# Patient Record
Sex: Male | Born: 1990 | Race: Black or African American | Hispanic: No | Marital: Single | Smoking: Light tobacco smoker
Health system: Southern US, Community
[De-identification: ages and names within clinical notes are randomized; demographics above are authoritative.]

## PROBLEM LIST (undated history)

## (undated) DIAGNOSIS — B029 Zoster without complications: Secondary | ICD-10-CM

## (undated) DIAGNOSIS — B20 Human immunodeficiency virus [HIV] disease: Secondary | ICD-10-CM

## (undated) DIAGNOSIS — L309 Dermatitis, unspecified: Secondary | ICD-10-CM

## (undated) DIAGNOSIS — Z21 Asymptomatic human immunodeficiency virus [HIV] infection status: Secondary | ICD-10-CM

## (undated) HISTORY — DX: Zoster without complications: B02.9

## (undated) HISTORY — DX: Dermatitis, unspecified: L30.9

---
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
## 2009-12-03 ENCOUNTER — Emergency Department (HOSPITAL_COMMUNITY): Admission: EM | Admit: 2009-12-03 | Discharge: 2009-12-04 | Payer: Self-pay | Admitting: Emergency Medicine

## 2011-08-05 ENCOUNTER — Emergency Department (INDEPENDENT_AMBULATORY_CARE_PROVIDER_SITE_OTHER)
Admission: EM | Admit: 2011-08-05 | Discharge: 2011-08-05 | Disposition: A | Discharge status: Home / Self Care | Payer: Medicaid Other | Source: Home / Self Care

## 2011-08-05 ENCOUNTER — Encounter: Payer: Self-pay | Admitting: *Deleted

## 2011-08-05 DIAGNOSIS — J069 Acute upper respiratory infection, unspecified: Secondary | ICD-10-CM

## 2011-08-05 MED ORDER — FEXOFENADINE-PSEUDOEPHED ER 60-120 MG PO TB12: 1.0000 | ORAL_TABLET | Freq: Two times a day (BID) | ORAL | Status: AC

## 2011-08-05 MED ORDER — IBUPROFEN 600 MG PO TABS
600.0000 mg | ORAL_TABLET | Freq: Three times a day (TID) | ORAL | Status: AC | PRN
Start: 2011-08-05 — End: 2011-08-15

## 2011-08-05 NOTE — ED Provider Notes (Signed)
History     CSN: [id number] Arrival date & time: 08/05/2011  8:34 PM   First MD Initiated Contact with Patient 08/05/11 2134      Chief Complaint  Patient presents with  . Headache  . Cough  . Chills  . Nasal Congestion    (Consider location/radiation/quality/duration/timing/severity/associated sxs/prior treatment) Patient is a 20 y.o. male presenting with URI. The history is provided by the patient.  URI The primary symptoms include headaches, cough and myalgias. Primary symptoms do not include fever, fatigue, ear pain, sore throat, swollen glands, wheezing, abdominal pain, nausea, vomiting or rash. The current episode started today. This is a new problem. The problem has not changed (has not taken any medications yest.) since onset. Symptoms associated with the illness include sinus pressure, congestion and rhinorrhea.    History reviewed. No pertinent past medical history.  History reviewed. No pertinent past surgical history.  History reviewed. No pertinent family history.  History  Substance Use Topics  . Smoking status: Never Smoker   . Smokeless tobacco: Not on file  . Alcohol Use: No      Review of Systems  Constitutional: Negative.  Negative for fever and fatigue.  HENT: Positive for congestion, rhinorrhea, sneezing and sinus pressure. Negative for ear pain and sore throat.   Respiratory: Positive for cough. Negative for wheezing.        Cough is non productive  Cardiovascular: Negative.   Gastrointestinal: Negative for nausea, vomiting and abdominal pain.  Musculoskeletal: Positive for myalgias.  Skin: Negative for rash.  Neurological: Positive for headaches.    Allergies  Review of patient's allergies indicates no known allergies.  Home Medications   Current Outpatient Rx  Name Route Sig Dispense Refill  . FEXOFENADINE-PSEUDOEPHEDRINE 60-120 MG PO TB12 Oral Take 1 tablet by mouth every 12 (twelve) hours. 30 tablet 0  . IBUPROFEN 600 MG PO TABS  Oral Take 1 tablet (600 mg total) by mouth 3 (three) times daily as needed for pain. 30 tablet 0    BP 111/67  Pulse 88  Temp(Src) 98.9 F (37.2 C) (Oral)  Resp 20  SpO2 100%  Physical Exam  Nursing note and vitals reviewed. Constitutional: He is oriented to person, place, and time. He appears well-developed and well-nourished. No distress.  HENT:  Right Ear: External ear normal.  Left Ear: External ear normal.  Mouth/Throat: No oropharyngeal exudate.       Mild pharyngeal erythema, nasal congestion, erythema and swelling of nasal turbinates with clear rhinorrhea.  Eyes: Conjunctivae are normal. Pupils are equal, round, and reactive to light.  Cardiovascular: Normal rate, regular rhythm and normal heart sounds.   Pulmonary/Chest: Effort normal and breath sounds normal. No respiratory distress. He has no wheezes. He has no rales.  Lymphadenopathy:    He has no cervical adenopathy.  Neurological: He is alert and oriented to person, place, and time.  Skin: No rash noted.    ED Course  Procedures (including critical care time)  Labs Reviewed - No data to display No results found.   1. URI (upper respiratory infection)       MDM  Symptomatic treatment for viral URI        Sharin Grave, MD 08/07/11 1049

## 2011-08-05 NOTE — ED Notes (Signed)
Pt is here with complaints of HA, facial pressure, runny nose, chills and non-productive cough with onset this morning.

## 2014-11-20 ENCOUNTER — Encounter (HOSPITAL_COMMUNITY): Payer: Self-pay | Admitting: Emergency Medicine

## 2014-11-20 ENCOUNTER — Emergency Department (INDEPENDENT_AMBULATORY_CARE_PROVIDER_SITE_OTHER)
Admission: EM | Admit: 2014-11-20 | Discharge: 2014-11-20 | Disposition: A | Discharge status: Home / Self Care | Payer: Medicaid Other | Source: Home / Self Care | Attending: Family Medicine | Admitting: Family Medicine

## 2014-11-20 DIAGNOSIS — K047 Periapical abscess without sinus: Secondary | ICD-10-CM

## 2014-11-20 MED ORDER — CLINDAMYCIN HCL 300 MG PO CAPS
300.0000 mg | ORAL_CAPSULE | Freq: Three times a day (TID) | ORAL | Status: DC
Start: 2014-11-20 — End: 2015-02-24

## 2014-11-20 NOTE — Discharge Instructions (Signed)
Thank you for coming in today. Take the antibiotic as directed.    Dental Abscess A dental abscess is a collection of infected fluid (pus) from a bacterial infection in the inner part of the tooth (pulp). It usually occurs at the end of the tooth's root.  CAUSES   Severe tooth decay.  Trauma to the tooth that allows bacteria to enter into the pulp, such as a broken or chipped tooth. SYMPTOMS   Severe pain in and around the infected tooth.  Swelling and redness around the abscessed tooth or in the mouth or face.  Tenderness.  Pus drainage.  Bad breath.  Bitter taste in the mouth.  Difficulty swallowing.  Difficulty opening the mouth.  Nausea.  Vomiting.  Chills.  Swollen neck glands. DIAGNOSIS   A medical and dental history will be taken.  An examination will be performed by tapping on the abscessed tooth.  X-rays may be taken of the tooth to identify the abscess. TREATMENT The goal of treatment is to eliminate the infection. You may be prescribed antibiotic medicine to stop the infection from spreading. A root canal may be performed to save the tooth. If the tooth cannot be saved, it may be pulled (extracted) and the abscess may be drained.  HOME CARE INSTRUCTIONS  Only take over-the-counter or prescription medicines for pain, fever, or discomfort as directed by your caregiver.  Rinse your mouth (gargle) often with salt water ( tsp salt in 8 oz [250 ml] of warm water) to relieve pain or swelling.  Do not drive after taking pain medicine (narcotics).  Do not apply heat to the outside of your face.  Return to your dentist for further treatment as directed. SEEK MEDICAL CARE IF:  Your pain is not helped by medicine.  Your pain is getting worse instead of better. SEEK IMMEDIATE MEDICAL CARE IF:  You have a fever or persistent symptoms for more than 2-3 days.  You have a fever and your symptoms suddenly get worse.  You have chills or a very bad  headache.  You have problems breathing or swallowing.  You have trouble opening your mouth.  You have swelling in the neck or around the eye. Document Released: 09/07/2005 Document Revised: 06/01/2012 Document Reviewed: 12/16/2010 [facility] Patient Information 2015 [place] [place]. This information is not intended to replace advice given to you by your health care provider. Make sure you discuss any questions you have with your health care provider.  [facility]:  [facility] [phone] (ext [phone])  [id number]  Please call Dr. Lawrence Marseillesivils office [phone] or cell [phone] [address], [place]  Cost for tooth removal $200 includes exam, Xray, and extraction and follow up visit.  Bring list of current medications with you.   [facility] - [address][phone]  [facility] [phone]- [phone]  [address]  [facility]  [address], [place], [place], [address]  [phone], Ext. [phone]  2nd and 4th Thursday of the month at 6:30am (Simple extractions only - no wisdom teeth or surgery) First come/First serve -First 10 clients served  [facility] [place] residents only)  [address], [place], [place], [address]  [phone]  [facility]  [phone]  [facility]  [phone]  [facility]  [phone]  Please call [facility] at [phone] to get the details to  get your tooth pulled.

## 2014-11-20 NOTE — ED Notes (Signed)
Right jaw swelling for 2 days.  Denies dental pain

## 2014-11-20 NOTE — ED Provider Notes (Signed)
Aaron Lee is a 24 y.o. male who presents to Urgent Care today for jaw swelling. Patient has a 2 day history of right sided lower jaw swelling. It does not hurt very much. He is taking Tylenol which helps a lot for pain. He denies any fevers or chills vomiting or diarrhea. He notes that he has a eroded tooth on that side. He attempted to contact a pediatric dentist but was not able to be seen as he is over 25 years old. No chest pain palpitations or shortness of breath.   History reviewed. No pertinent past medical history. History reviewed. No pertinent past surgical history. History  Substance Use Topics  . Smoking status: Current Every Day Smoker  . Smokeless tobacco: Not on file  . Alcohol Use: No   ROS as above Medications: No current facility-administered medications for this encounter.   Current Outpatient Prescriptions  Medication Sig Dispense Refill  . clindamycin (CLEOCIN) 300 MG capsule Take 1 capsule (300 mg total) by mouth 3 (three) times daily. 30 capsule 0   No Known Allergies   Exam:  BP 113/76 mmHg  Pulse 80  Temp(Src) 97.7 F (36.5 C) (Oral)  Resp 20  SpO2 100% Gen: Well NAD HEENT: EOMI,  MMM right premolar rotated to gumline. Tender swollen right lower jaw. No midline swelling. No palate elevation. No fluctuance. Lungs: Normal work of breathing. CTABL Heart: RRR no MRG Abd: NABS, Soft. Nondistended, Nontender Exts: Brisk capillary refill, warm and well perfused.   No results found for this or any previous visit (from the past 24 hour(s)). No results found.  Assessment and Plan: 24 y.o. male with dental infection. Treat with clindamycin. Follow-up with a dentist.  Discussed warning signs or symptoms. Please see discharge instructions. Patient expresses understanding.     Rodolph BongEvan S Callan Yontz, MD 11/20/14 1037

## 2015-01-30 ENCOUNTER — Emergency Department (HOSPITAL_COMMUNITY)
Admission: EM | Admit: 2015-01-30 | Discharge: 2015-01-30 | Disposition: A | Discharge status: Home / Self Care | Payer: Medicaid Other | Attending: Emergency Medicine | Admitting: Emergency Medicine

## 2015-01-30 ENCOUNTER — Encounter (HOSPITAL_COMMUNITY): Payer: Self-pay | Admitting: Emergency Medicine

## 2015-01-30 DIAGNOSIS — Y288XXA Contact with other sharp object, undetermined intent, initial encounter: Secondary | ICD-10-CM | POA: Diagnosis not present

## 2015-01-30 DIAGNOSIS — Y929 Unspecified place or not applicable: Secondary | ICD-10-CM | POA: Insufficient documentation

## 2015-01-30 DIAGNOSIS — S61011A Laceration without foreign body of right thumb without damage to nail, initial encounter: Secondary | ICD-10-CM | POA: Insufficient documentation

## 2015-01-30 DIAGNOSIS — Y998 Other external cause status: Secondary | ICD-10-CM | POA: Insufficient documentation

## 2015-01-30 DIAGNOSIS — Y93G1 Activity, food preparation and clean up: Secondary | ICD-10-CM | POA: Diagnosis not present

## 2015-01-30 DIAGNOSIS — Z23 Encounter for immunization: Secondary | ICD-10-CM | POA: Insufficient documentation

## 2015-01-30 DIAGNOSIS — Z720 Tobacco use: Secondary | ICD-10-CM | POA: Diagnosis not present

## 2015-01-30 MED ORDER — TETANUS-DIPHTH-ACELL PERTUSSIS 5-2.5-18.5 LF-MCG/0.5 IM SUSP
0.5000 mL | Freq: Once | INTRAMUSCULAR | Status: AC
  Administered 2015-01-30: 0.5 mL via INTRAMUSCULAR
  Filled 2015-01-30: qty 0.5

## 2015-01-30 MED ORDER — LIDOCAINE HCL (PF) 1 % IJ SOLN
10.0000 mL | Freq: Once | INTRAMUSCULAR | Status: AC
  Administered 2015-01-30: 10 mL via INTRADERMAL
  Filled 2015-01-30: qty 10

## 2015-01-30 NOTE — ED Provider Notes (Signed)
CSN: [id number]     Arrival date & time 01/30/15  2140 History  This chart was scribed for non-physician practitioner, Sharilyn SitesLisa Sanders, PA-C working with Gerhard Munchobert Lockwood, MD by Gwenyth Oberatherine Macek, ED scribe. This patient was seen in room TR10C/TR10C and the patient's care was started at 10:35 PM   Chief Complaint  Patient presents with  . Extremity Laceration    The patient said he was washing dishes and a cup broke and cut his thumb.  Bleeding is controlled but his pain is 10/10.     The history is provided by the patient. No language interpreter was used.   HPI Comments: Aaron Lee is a 24 y.o. male who presents to the Emergency Department complaining of a laceration, with controlled bleeding and associated 10/10 pain, to the lateral aspect of his thumb that occurred PTA. Pt reports that he was washing a cup when it broke on his hand. He does not know the date of his last Tetanus. Pt denies decreased ROM of his thumb. He also denies numbness and tingling as an associated symptom.   History reviewed. No pertinent past medical history. History reviewed. No pertinent past surgical history. History reviewed. No pertinent family history. History  Substance Use Topics  . Smoking status: Current Every Day Smoker  . Smokeless tobacco: Not on file  . Alcohol Use: No    Review of Systems  Skin: Positive for wound.  Neurological: Negative for numbness.  All other systems reviewed and are negative.  Allergies  Review of patient's allergies indicates no known allergies.  Home Medications   Prior to Admission medications   Medication Sig Start Date End Date Taking? Authorizing Provider  clindamycin (CLEOCIN) 300 MG capsule Take 1 capsule (300 mg total) by mouth 3 (three) times daily. Patient not taking: Reported on 01/30/2015 11/20/14   Rodolph BongEvan S Corey, MD   BP 136/93 mmHg  Pulse 64  Temp(Src) 98.4 F (36.9 C) (Oral)  Resp 16  SpO2 99%   Physical Exam  Constitutional: He is oriented to  person, place, and time. He appears well-developed and well-nourished.  HENT:  Head: Normocephalic and atraumatic.  Mouth/Throat: Oropharynx is clear and moist.  Eyes: Conjunctivae and EOM are normal. Pupils are equal, round, and reactive to light.  Neck: Normal range of motion.  Cardiovascular: Normal rate, regular rhythm and normal heart sounds.   Pulmonary/Chest: Effort normal and breath sounds normal. No respiratory distress. He has no wheezes.  Musculoskeletal: Normal range of motion.  3cm laceration to base of right thumb, bleeding well controlled; wound clean without retained foreign body; no evidence of deep tissue, tendon, or vessel involvement; full range of motion of thumb; strong radial pulse and cap refill, sensation intact  Neurological: He is alert and oriented to person, place, and time.  Skin: Skin is warm and dry.  Psychiatric: He has a normal mood and affect.  Nursing note and vitals reviewed.   ED Course  Procedures   LACERATION REPAIR Performed by: Garlon HatchetSANDERS, LISA M Authorized by: Garlon HatchetSANDERS, LISA M Consent: Verbal consent obtained. Risks and benefits: risks, benefits and alternatives were discussed Consent given by: patient Patient identity confirmed: provided demographic data Prepped and Draped in normal sterile fashion Wound explored  Laceration Location: base of right thumn  Laceration Length: 3 cm  No Foreign Bodies seen or palpated  Anesthesia: local infiltration  Local anesthetic: lidocaine 1% without epinephrine  Anesthetic total: 4 ml  Irrigation method: syringe Amount of cleaning: standard  Skin closure: 4-0  prolene  Number of sutures: 3  Technique: simple interrupted  Patient tolerance: Patient tolerated the procedure well with no immediate complications.   DIAGNOSTIC STUDIES: Oxygen Saturation is 99% on RA, normal by my interpretation.    COORDINATION OF CARE: 10:41 PM Discussed treatment plan with pt at bedside and pt agreed to  plan.  Labs Review Labs Reviewed - No data to display  Imaging Review No results found.   EKG Interpretation None      MDM   Final diagnoses:  Thumb laceration, right, initial encounter   24 year old male with 3 cm laceration to base of right thumb from broken glass. Wound is clean without evidence of retained foreign body. There is no evidence of deep tissue, vessel, or tendon involvement. Full range of motion of thumb maintained and hand is neurovascularly intact. Laceration repaired as above, patient tolerated well. Tetanus was updated. Patient instructed on home wound care. He will follow-up in one week for suture removal.  Discussed plan with patient, he/she acknowledged understanding and agreed with plan of care.  Return precautions given for new or worsening symptoms.  I personally performed the services described in this documentation, which was scribed in my presence. The recorded information has been reviewed and is accurate.  Garlon HatchetLisa M Sanders, PA-C 01/30/15 [id number]  Gerhard Munchobert Lockwood, MD 01/30/15 [phone]

## 2015-01-30 NOTE — Discharge Instructions (Signed)
Keep wound clean and dry. Follow-up with urgent care in 1 week for suture removal. Return to the ED for new or worsening symptoms.

## 2015-01-30 NOTE — ED Notes (Signed)
The patient said he was washing dishes and a cup broke and cut his thumb.  Bleeding is controlled but his pain is 10/10.

## 2015-02-08 ENCOUNTER — Emergency Department (HOSPITAL_COMMUNITY)
Admission: EM | Admit: 2015-02-08 | Discharge: 2015-02-08 | Disposition: A | Discharge status: Home / Self Care | Payer: Medicaid Other | Attending: Emergency Medicine | Admitting: Emergency Medicine

## 2015-02-08 ENCOUNTER — Encounter (HOSPITAL_COMMUNITY): Payer: Self-pay | Admitting: Emergency Medicine

## 2015-02-08 DIAGNOSIS — Z720 Tobacco use: Secondary | ICD-10-CM | POA: Insufficient documentation

## 2015-02-08 DIAGNOSIS — Z792 Long term (current) use of antibiotics: Secondary | ICD-10-CM | POA: Insufficient documentation

## 2015-02-08 DIAGNOSIS — Z4802 Encounter for removal of sutures: Secondary | ICD-10-CM | POA: Diagnosis present

## 2015-02-08 NOTE — Discharge Instructions (Signed)

## 2015-02-08 NOTE — ED Notes (Signed)
Patient here for suture removal in R hand.   No signs or symptoms of infection.   Patient denies pain.

## 2015-02-08 NOTE — ED Provider Notes (Signed)
CSN: [id number]     Arrival date & time 02/08/15  1407 History  This chart was scribed for non-physician practitioner Marlon Peliffany Hibo Blasdell, PA, working with Elwin MochaBlair Walden, MD, by Tanda RockersMargaux Venter, ED Scribe. This patient was seen in room TR08C/TR08C and the patient's care was started at 2:44 PM.     Chief Complaint  Patient presents with  . Suture / Staple Removal   The history is provided by the patient. No language interpreter was used.     HPI Comments: Aaron Lee is a 24 y.o. male who presents to the Emergency Department complaining of for suture removal to right thumb. Pt was washing dishes when he cut the base of his thumb on cup. He was seen in ED on 01/30/20 (approximately 9 days ago). He is not currently having any pain to the finger. Denies fever, chills, redness, drainage, or any other associated symptoms. Notes full ROM to thumb.    History reviewed. No pertinent past medical history. History reviewed. No pertinent past surgical history. No family history on file. History  Substance Use Topics  . Smoking status: Current Every Day Smoker  . Smokeless tobacco: Not on file  . Alcohol Use: No    Review of Systems  Constitutional: Negative for fever and chills.  Skin: Negative for color change.       No drainage.   All other systems reviewed and are negative.     Allergies  Review of patient's allergies indicates no known allergies.  Home Medications   Prior to Admission medications   Medication Sig Start Date End Date Taking? Authorizing Provider  clindamycin (CLEOCIN) 300 MG capsule Take 1 capsule (300 mg total) by mouth 3 (three) times daily. Patient not taking: Reported on 01/30/2015 11/20/14   Rodolph BongEvan S Corey, MD   Triage Vitals: BP 121/80 mmHg  Pulse 77  Temp(Src) 98.6 F (37 C) (Oral)  Resp 22  SpO2 99%   Physical Exam  Constitutional: He is oriented to person, place, and time. He appears well-developed and well-nourished. No distress.  HENT:  Head:  Normocephalic and atraumatic.  Eyes: Conjunctivae and EOM are normal.  Neck: Neck supple. No tracheal deviation present.  Cardiovascular: Normal rate.   Pulmonary/Chest: Effort normal. No respiratory distress.  Musculoskeletal: Normal range of motion.       Hands: Neurological: He is alert and oriented to person, place, and time.  Skin: Skin is warm and dry.  Psychiatric: He has a normal mood and affect. His behavior is normal.  Nursing note and vitals reviewed.   ED Course  Procedures (including critical care time)  DIAGNOSTIC STUDIES: Oxygen Saturation is 99% on RA, normal by my interpretation.    COORDINATION OF CARE: 2:46 PM-Discussed treatment plan which includes suture removal with pt at bedside and pt agreed to plan.   Labs Review Labs Reviewed - No data to display  Imaging Review No results found.   EKG Interpretation None      MDM   Final diagnoses:  Visit for suture removal   SUTURE REMOVAL Performed by: Dorthula MatasGREENE,Jakyle Petrucelli G  Consent: Verbal consent obtained. Patient identity confirmed: provided demographic data Time out: Immediately prior to procedure a "time out" was called to verify the correct patient, procedure, equipment, support staff and site/side marked as required.  Location details: right lateral palm  Wound Appearance: clean  Sutures/Staples Removed: 3  Facility: sutures placed in this facility Patient tolerance: Patient tolerated the procedure well with no immediate complications.   23 y.o.Aaron LawlessMarcus D  Lee's evaluation in the Emergency Department is complete. It has been determined that no acute conditions requiring further emergency intervention are present at this time. The patient/guardian have been advised of the diagnosis and plan. We have discussed signs and symptoms that warrant return to the ED, such as changes or worsening in symptoms.  Vital signs are stable at discharge. Filed Vitals:   02/08/15 1422  BP: 121/80  Pulse: 77   Temp: 98.6 F (37 C)  Resp: 22    Patient/guardian has voiced understanding and agreed to follow-up with the PCP or specialist.  I personally performed the services described in this documentation, which was scribed in my presence. The recorded information has been reviewed and is accurate.      Marlon Peliffany Dagan Heinz, PA-C 02/08/15 1458  Elwin MochaBlair Walden, MD 02/08/15 1515

## 2015-02-23 ENCOUNTER — Emergency Department (HOSPITAL_COMMUNITY)
Admission: EM | Admit: 2015-02-23 | Discharge: 2015-02-24 | Disposition: A | Discharge status: Home / Self Care | Payer: Medicaid Other | Attending: Emergency Medicine | Admitting: Emergency Medicine

## 2015-02-23 ENCOUNTER — Encounter (HOSPITAL_COMMUNITY): Payer: Self-pay | Admitting: Emergency Medicine

## 2015-02-23 DIAGNOSIS — Z720 Tobacco use: Secondary | ICD-10-CM | POA: Insufficient documentation

## 2015-02-23 DIAGNOSIS — K047 Periapical abscess without sinus: Secondary | ICD-10-CM | POA: Insufficient documentation

## 2015-02-23 DIAGNOSIS — K088 Other specified disorders of teeth and supporting structures: Secondary | ICD-10-CM | POA: Diagnosis present

## 2015-02-23 DIAGNOSIS — K029 Dental caries, unspecified: Secondary | ICD-10-CM | POA: Insufficient documentation

## 2015-02-23 NOTE — ED Notes (Signed)
Patient here with dental pain and swelling starting last night. Obvious swelling to lower right right jaw. No obvious damaged or decaying tooth in that area. Denies fever.

## 2015-02-24 MED ORDER — BUPIVACAINE-EPINEPHRINE (PF) 0.5% -1:200000 IJ SOLN
1.8000 mL | Freq: Once | INTRAMUSCULAR | Status: AC
  Administered 2015-02-24: 1.8 mL
  Filled 2015-02-24: qty 1.8

## 2015-02-24 MED ORDER — IBUPROFEN 800 MG PO TABS: 800.0000 mg | ORAL_TABLET | Freq: Three times a day (TID) | ORAL | Status: DC

## 2015-02-24 MED ORDER — CLINDAMYCIN HCL 150 MG PO CAPS: 450.0000 mg | ORAL_CAPSULE | Freq: Three times a day (TID) | ORAL | Status: DC

## 2015-02-24 MED ORDER — CLINDAMYCIN HCL 300 MG PO CAPS
450.0000 mg | ORAL_CAPSULE | Freq: Once | ORAL | Status: AC
  Administered 2015-02-24: 450 mg via ORAL
  Filled 2015-02-24: qty 1

## 2015-02-24 NOTE — Discharge Instructions (Signed)
1. Medications: clindamycin, ibuprofen, usual home medications 2. Treatment: rest, drink plenty of fluids, take medications as prescribed 3. Follow Up: Please followup with dentistry within 1 week for discussion of your diagnoses and further evaluation after today's visit; if you do not have a primary care doctor use the resource guide provided to find one; Return to the ER for high fevers, difficulty breathing, difficulty swallowing or other concerning symptoms    Dental Abscess A dental abscess is a collection of infected fluid (pus) from a bacterial infection in the inner part of the tooth (pulp). It usually occurs at the end of the tooth's root.  CAUSES   Severe tooth decay.  Trauma to the tooth that allows bacteria to enter into the pulp, such as a broken or chipped tooth. SYMPTOMS   Severe pain in and around the infected tooth.  Swelling and redness around the abscessed tooth or in the mouth or face.  Tenderness.  Pus drainage.  Bad breath.  Bitter taste in the mouth.  Difficulty swallowing.  Difficulty opening the mouth.  Nausea.  Vomiting.  Chills.  Swollen neck glands. DIAGNOSIS   A medical and dental history will be taken.  An examination will be performed by tapping on the abscessed tooth.  X-rays may be taken of the tooth to identify the abscess. TREATMENT The goal of treatment is to eliminate the infection. You may be prescribed antibiotic medicine to stop the infection from spreading. A root canal may be performed to save the tooth. If the tooth cannot be saved, it may be pulled (extracted) and the abscess may be drained.  HOME CARE INSTRUCTIONS  Only take over-the-counter or prescription medicines for pain, fever, or discomfort as directed by your caregiver.  Rinse your mouth (gargle) often with salt water ( tsp salt in 8 oz [250 ml] of warm water) to relieve pain or swelling.  Do not drive after taking pain medicine (narcotics).  Do not apply  heat to the outside of your face.  Return to your dentist for further treatment as directed. SEEK MEDICAL CARE IF:  Your pain is not helped by medicine.  Your pain is getting worse instead of better. SEEK IMMEDIATE MEDICAL CARE IF:  You have a fever or persistent symptoms for more than 2-3 days.  You have a fever and your symptoms suddenly get worse.  You have chills or a very bad headache.  You have problems breathing or swallowing.  You have trouble opening your mouth.  You have swelling in the neck or around the eye. Document Released: 09/07/2005 Document Revised: 06/01/2012 Document Reviewed: 12/16/2010 [facility] Patient Information 2015 [place], [place]. This information is not intended to replace advice given to you by your health care provider. Make sure you discuss any questions you have with your health care provider.   Emergency Department Resource Guide 1) Find a Doctor and Pay Out of Pocket Although you won't have to find out who is covered by your insurance plan, it is a good idea to ask around and get recommendations. You will then need to call the office and see if the doctor you have chosen will accept you as a new patient and what types of options they offer for patients who are self-pay. Some doctors offer discounts or will set up payment plans for their patients who do not have insurance, but you will need to ask so you aren't surprised when you get to your appointment.  2) Contact Your Local Health Department Not all health  departments have doctors that can see patients for sick visits, but many do, so it is worth a call to see if yours does. If you don't know where your local health department is, you can check in your phone book. The CDC also has a tool to help you locate your state's health department, and many state websites also have listings of all of their local health departments.  3) Find a Walk-in Clinic If your illness is not likely to be very severe or  complicated, you may want to try a walk in clinic. These are popping up all over the country in pharmacies, drugstores, and shopping centers. They're usually staffed by nurse practitioners or physician assistants that have been trained to treat common illnesses and complaints. They're usually fairly quick and inexpensive. However, if you have serious medical issues or chronic medical problems, these are probably not your best option.  No Primary Care Doctor: - Call Health Connect at  [phone] - they can help you locate a primary care doctor that  accepts your insurance, provides certain services, etc. - Physician Referral Service- [phone]  Chronic Pain Problems: Organization         Address  Phone   Notes  [facility]  [phone] Patients need to be referred by their primary care doctor.   Medication Assistance: Organization         Address  Phone   Notes  [facility] [address] [place], [place] [address] [phone] --Must be a resident of [facility] [place] -- Must have NO insurance coverage whatsoever (no Medicaid/ Medicare, etc.) -- The pt. MUST have a primary care doctor that directs their care regularly and follows them in the community   MedAssist  [phone]   [employer]  [phone]    Agencies that provide inexpensive medical care: Organization         Address  Phone   Notes  [facility]  [phone]) [phone]   [facility]    [phone]   [facility] [address] [place], [place] [address] [phone]   [facility] [place] [address], [place] [phone]) [phone]   Planned Parenthood    [phone](336) [phone]   [facility]    [phone]) [phone]   [facility]  [address], [place] Phone:  [phone]) [phone], Fax:  [phone] Hours of Operation:  9 am - 6 pm, M-F.  Also accepts  Medicaid/Medicare and self-pay.  [facility]  [address], [place] Phone: [phone], Fax: [phone]) [phone]. Hours of Operation:  8:30 am - 5:30 pm, M-F.  Also accepts Medicaid and self-pay.  [facility] [place] [address], [place] Phone: [phone]   [facility] [address], [place], [place] [phone], Ext. [phone] Mondays & Thursdays: 7-9 AM.  First 15 patients are seen on a first come, first serve basis.    Medicaid-accepting [facility] Providers:  Organization         Address  Phone   Notes  [facility] [address], [place] [phone]) [phone] Also accepts self-pay patients.  [facility] [address], [place]  [phone]) [phone]   [facility] [address], [place] [phone]) [phone]   [facility] [address], [place] [phone]   Renaye Rakers [address], [place]   [phone] Only accepts [place] patients after they have their name applied to their card.   Self-Pay (no insurance) in [facility]:  Organization         Address  Phone   Notes  Sickle Cell Patients, [facility] [address] [place], [place] [phone]   [facility] [address] [place], [place] [phone]   [facility] [place]  [address], [place]o Ramirez [phone]   [facility]/Dr. Osei-Bonsu  [address] [place] or [address], [place] [phone] Phone number for both [place] and [place] locations is the same.  Urgent Medical and [facility] [address], [place] [phone]   [facility] [address] [place] or [address] [phone]   [facility] [address] [place] [phone], phone; [phone], fax Sees patients 1st and 3rd Saturday of every month.  Must not qualify for public or private insurance (i.e. Medicaid, Medicare, [place] Health Choice, Veterans' Benefits)  Household income should be no more than 200% of the poverty level The clinic cannot treat you if you are pregnant or think you are pregnant  Sexually transmitted diseases are not treated at the clinic.    Dental Care: Organization         Address  Phone  Notes  [facility] Department of [facility] [address] [place], [place] [phone] Accepts children up to age 70 who are enrolled in [place] or [place] Health Choice; pregnant women with a Medicaid card; and children who have applied for Medicaid or [place] Health Choice, but were declined, whose parents can pay a reduced fee at time of service.  [facility] of [facility]  [address], [place] [phone] Accepts children up to age 57 who are enrolled in [place] or [place] Health Choice; pregnant women with a Medicaid card; and children who have applied for Medicaid or [place] Health Choice, but were declined, whose parents can pay a reduced fee at time of service.  [facility]  [address] [place], [place] [phone] Patients are seen by appointment only. Walk-ins are not accepted. [facility] will see patients 50 years of age and older. Monday - Tuesday (8am-5pm) Most Wednesdays (8:30-5pm) $30 per visit, cash only  [facility] Adult Dental Access PROGRAM  [address], [facility] [phone] Patients are seen by appointment only. Walk-ins are not accepted. [facility] will see patients 49 years of age and older. One Wednesday Evening (Monthly: Volunteer Based).  $30 per visit, cash only  [employer]  [phone] for adults; Children under age 45, call Graduate Pediatric Dentistry at [phone]. Children aged  88-14, please call [phone] to request a pediatric application.  Dental services are provided in all areas of dental care including fillings, crowns and bridges, complete and partial dentures, implants, gum treatment, root canals, and extractions. Preventive care is also provided. Treatment is provided to both adults and children. Patients are selected via a lottery and there is often a waiting list.   [facility] [address], [place]  [phone] [email or web address]   [facility] [address], [place], [place] [phone], Ext. [phone] Second and Fourth Thursday of each month, opens at 6:30 AM; Clinic ends at 9 AM.  Patients are seen on a first-come first-served basis, and a limited number are seen during each clinic.   [facility]  [address], [place], [place] [phone]) [phone]   Eligibility Requirements You must have lived in [place], [place], or [place] counties for at least the last three months.   You cannot be eligible for state or federal sponsored National Cityhealthcare insurance, including CIGNAVeterans Administration, [place]icaid, or [employer]icare.   You generally cannot be eligible for healthcare insurance through your employer.    How to apply: Eligibility screenings are held every Tuesday and Wednesday afternoon from 1:00 pm until 4:00 pm. You do not need an appointment for the interview!  [facility] [address], [place], [place] [phone]   [facility]  [phone]   [facility]  [phone]   [facility]  [phone]    Behavioral Health Resources in the Community: Intensive Outpatient Programs Organization         Address  Phone  Notes  [facility] [address], [place], [place] [phone]   [facility] [address], [place], [place] [phone]   [facility] [address],  [place], [place]  [phone]   [facility] [address],  [place], [place] [phone] or [phone]   Substance Abuse Resources Organization         Address  Phone  Notes  [facility] Services  [phone]   [facility]  [phone]   [facility]  [phone]   [facility]  [phone]   Residential & Outpatient Substance Abuse Program  [phone]   Psychological Services Organization         Address  Phone  Notes  [facility]  [phone]- [phone]   [facility]  [phone]- [phone]   [facility] [address], [place] [phone] or [phone]    Mobile Crisis Teams Organization         Address  Phone  Notes  [facility], Mobile Crisis Care Unit  [phone]   [facility]  [address] [place], [place] [phone]   Doristine LocksSharon DeEsch [address] [place] [phone]    Self-Help/Support Groups Organization         Address  Phone             Notes  [facility] [place] - variety of support groups  336- [phone] Call for more information  Narcotics Anonymous (NA), [facility] [address], [place]  2 meetings at this location   Statisticianesidential Treatment Programs Organization         Address  Phone  Notes  [facility] [address],    [place]  [phone]   [facility]  [address], [place] [phone], [place], [place] [phone]   [facility] [address], [place] [phone] Admissions: 8am-3pm M-F  [facility] [address]    [place], [place] [phone]   [facility] [address], [place], [place] [phone]   [facility] [address]  [place], [place] [phone]   [facility] - Intensive Outpatient [phone],  [place], [place] [phone]   [facility]  (Addiction Recovery Care [facility] [address]  [place], [place] [phone] or [phone]   [facility] ([facility]) [address] [place], [place] [phone] Accepts Medicaid  [facility] [address],  [place] [phone] Substance Abuse/Addiction Treatment   [facility] Organization         Address  Phone  Notes  [facility]  [phone]   Angie Fava, PhD [address]vin Knack [place], [place]   [phone] or [phone]   [facility]   [address] [place], [place] [phone]   [facility] [address], [place], [place] [phone] Insurance/Medicaid/sponsorship through [facility] and Families [address]                                    [place], [place] [phone] Therapy/tele-psych/case  [address] [facility] [address] Drive[place], [place] [phone]    Dr. Lolly Mustache  [phone]   [facility] [place]  [employer] [facility] Dept. 1) [address] [place] 2) [address] [place] 3)  [address], [place] [phone]  [phone]   [facility] Child Abuse Hotline 228-123-8938 or 5102879685 (After Hours)

## 2015-02-24 NOTE — ED Provider Notes (Signed)
CSN: [id number]     Arrival date & time 02/23/15  2327 History   First MD Initiated Contact with Patient 02/23/15 2355     Chief Complaint  Patient presents with  . Dental Pain     (Consider location/radiation/quality/duration/timing/severity/associated sxs/prior Treatment) Patient is a 24 y.o. male presenting with tooth pain. The history is provided by the patient and medical records. No language interpreter was used.  Dental Pain Associated symptoms: facial swelling   Associated symptoms: no drooling, no fever, no headaches and no neck pain      BOLESLAUS HOLLOWAY is a 24 y.o. male  with no major medical problems presents to the Emergency Department complaining of gradual, persistent, progressively worsening lower right dental pain with associated facial swelling onset yesterday.  No treatments PTA.  Nothing makes it better and eating makes it worse.  Pt denies fever, chills, nausea, vomiting.     History reviewed. No pertinent past medical history. History reviewed. No pertinent past surgical history. History reviewed. No pertinent family history. History  Substance Use Topics  . Smoking status: Current Some Day Smoker  . Smokeless tobacco: Not on file  . Alcohol Use: Yes     Comment: occ    Review of Systems  Constitutional: Negative for fever, chills and appetite change.  HENT: Positive for dental problem and facial swelling. Negative for drooling, ear pain, nosebleeds, postnasal drip, rhinorrhea and trouble swallowing.   Eyes: Negative for pain and redness.  Respiratory: Negative for cough and wheezing.   Cardiovascular: Negative for chest pain.  Gastrointestinal: Negative for nausea, vomiting and abdominal pain.  Musculoskeletal: Negative for neck pain and neck stiffness.  Skin: Negative for color change and rash.  Neurological: Negative for weakness, light-headedness and headaches.  All other systems reviewed and are negative.     Allergies  Review of patient's  allergies indicates no known allergies.  Home Medications   Prior to Admission medications   Medication Sig Start Date End Date Taking? Authorizing Provider  clindamycin (CLEOCIN) 150 MG capsule Take 3 capsules (450 mg total) by mouth 3 (three) times daily. 02/24/15   Zierra Laroque, PA-C  ibuprofen (ADVIL,MOTRIN) 800 MG tablet Take 1 tablet (800 mg total) by mouth 3 (three) times daily. 02/24/15   Aaleeyah Bias, PA-C   BP 109/59 mmHg  Pulse 54  Temp(Src) 98.3 F (36.8 C) (Oral)  Resp 16  SpO2 97% Physical Exam  Constitutional: He appears well-developed and well-nourished.  HENT:  Head: Normocephalic.  Right Ear: Tympanic membrane, external ear and ear canal normal.  Left Ear: Tympanic membrane, external ear and ear canal normal.  Nose: Nose normal. Right sinus exhibits no maxillary sinus tenderness and no frontal sinus tenderness. Left sinus exhibits no maxillary sinus tenderness and no frontal sinus tenderness.  Mouth/Throat: Uvula is midline, oropharynx is clear and moist and mucous membranes are normal. No oral lesions. Abnormal dentition. Dental caries present. No uvula swelling or lacerations. No oropharyngeal exudate, posterior oropharyngeal edema, posterior oropharyngeal erythema or tonsillar abscesses.  Gingival swelling and fluctuance along the lower right gumline of tooth #29-30 No tenderness to palpation, induration or swelling of the floor of the mouth Soft tissue of the mandible without induration, erythema or swelling  Eyes: Conjunctivae are normal. Pupils are equal, round, and reactive to light. Right eye exhibits no discharge. Left eye exhibits no discharge.  Neck: Normal range of motion. Neck supple.  No stridor Handling secretions without difficulty No nuchal rigidity No cervical lymphadenopathy   Cardiovascular: Normal  rate, regular rhythm and normal heart sounds.   Pulmonary/Chest: Effort normal. No respiratory distress.  Equal chest rise  Abdominal:  Soft. Bowel sounds are normal. He exhibits no distension. There is no tenderness.  Lymphadenopathy:    He has no cervical adenopathy.  Neurological: He is alert.  Skin: Skin is warm and dry.  Psychiatric: He has a normal mood and affect.  Nursing note and vitals reviewed.   ED Course  INCISION AND DRAINAGE Date/Time: 02/24/2015 12:47 AM Performed by: Dierdre ForthMUTHERSBAUGH, [place] Viereck Authorized by: Dierdre ForthMUTHERSBAUGH, Evalisse Prajapati Consent: Verbal consent obtained. Risks and benefits: risks, benefits and alternatives were discussed Consent given by: patient Patient understanding: patient states understanding of the procedure being performed Patient consent: the patient's understanding of the procedure matches consent given Procedure consent: procedure consent matches procedure scheduled Relevant documents: relevant documents present and verified Site marked: the operative site was marked Required items: required blood products, implants, devices, and special equipment available Patient identity confirmed: verbally with patient and arm band Time out: Immediately prior to procedure a "time out" was called to verify the correct patient, procedure, equipment, support staff and site/side marked as required. Type: abscess Body area: mouth (gingiva) Anesthesia: local infiltration Local anesthetic: bupivacaine 0.5% with epinephrine Anesthetic total: 1.8 ml Patient sedated: no Scalpel size: 11 Incision type: single straight Complexity: simple Drainage: purulent Drainage amount: copious Wound treatment: wound left open Patient tolerance: Patient tolerated the procedure well with no immediate complications  Dental Date/Time: 02/24/2015 12:43 AM Performed by: Dierdre ForthMUTHERSBAUGH, Lis Savitt Authorized by: Dierdre ForthMUTHERSBAUGH, Odarius Dines Consent: Verbal consent obtained. Risks and benefits: risks, benefits and alternatives were discussed Consent given by: patient Patient understanding: patient states understanding of the procedure  being performed Patient consent: the patient's understanding of the procedure matches consent given Procedure consent: procedure consent matches procedure scheduled Relevant documents: relevant documents present and verified Site marked: the operative site was marked Required items: required blood products, implants, devices, and special equipment available Patient identity confirmed: verbally with patient and arm band Time out: Immediately prior to procedure a "time out" was called to verify the correct patient, procedure, equipment, support staff and site/side marked as required. Preparation: Patient was prepped and draped in the usual sterile fashion. Local anesthesia used: yes Anesthesia: local infiltration Local anesthetic: bupivacaine 0.5% with epinephrine Anesthetic total: 1.8 ml Patient sedated: no Patient tolerance: Patient tolerated the procedure well with no immediate complications   (including critical care time) Labs Review Labs Reviewed - No data to display  Imaging Review No results found.   EKG Interpretation None      MDM   Final diagnoses:  Dental abscess  Pain due to dental caries   Ann HeldMarcus D Feng presents with right lower dental pain and gross abscess.  I&D of abscess without complication.  Exam unconcerning for Ludwig's angina or spread of infection.  Will treat with clindamycin and pain medicine.  First dose of antibiotic given in the ED.  Urged patient to follow-up with dentist.  Pt without signs or symptoms of systemic infection; does not meet SIRS or sepsis criteria.   BP 109/59 mmHg  Pulse 54  Temp(Src) 98.3 F (36.8 C) (Oral)  Resp 16  SpO2 97%   Dierdre ForthHannah Maddyn Lieurance, PA-C 02/24/15 0048  Richardean Canalavid H Yao, MD 02/24/15 [phone]

## 2015-02-24 NOTE — ED Notes (Signed)
450 mg of Clindamycin given (See MAR)

## 2015-09-30 ENCOUNTER — Emergency Department (HOSPITAL_COMMUNITY)
Admission: EM | Admit: 2015-09-30 | Discharge: 2015-09-30 | Disposition: A | Discharge status: Home / Self Care | Payer: Medicaid Other | Attending: Emergency Medicine | Admitting: Emergency Medicine

## 2015-09-30 ENCOUNTER — Encounter (HOSPITAL_COMMUNITY): Payer: Self-pay | Admitting: Family Medicine

## 2015-09-30 DIAGNOSIS — F17200 Nicotine dependence, unspecified, uncomplicated: Secondary | ICD-10-CM | POA: Diagnosis not present

## 2015-09-30 DIAGNOSIS — R21 Rash and other nonspecific skin eruption: Secondary | ICD-10-CM | POA: Diagnosis not present

## 2015-09-30 DIAGNOSIS — Z791 Long term (current) use of non-steroidal anti-inflammatories (NSAID): Secondary | ICD-10-CM | POA: Insufficient documentation

## 2015-09-30 DIAGNOSIS — Z792 Long term (current) use of antibiotics: Secondary | ICD-10-CM | POA: Insufficient documentation

## 2015-09-30 MED ORDER — DIPHENHYDRAMINE HCL 25 MG PO TABS: 25.0000 mg | ORAL_TABLET | Freq: Four times a day (QID) | ORAL | Status: DC

## 2015-09-30 MED ORDER — PREDNISONE 20 MG PO TABS: 40.0000 mg | ORAL_TABLET | Freq: Every day | ORAL | Status: DC

## 2015-09-30 NOTE — ED Provider Notes (Signed)
CSN: [id number]     Arrival date & time 09/30/15  1155 History   First MD Initiated Contact with Patient 09/30/15 1224     Chief Complaint  Patient presents with  . Rash     (Consider location/radiation/quality/duration/timing/severity/associated sxs/prior Treatment) HPI Aaron Lee is a 25 y.o. male who comes in for valuation of rash. Patient reports on Christmas he received a ninja turtles body wash and since he started using it he has broken out in a diffuse, itchy rash. He has not tried anything to improve his symptoms. Nothing seems to make it better or worse. He denies any shortness of breath, difficulty breathing/swallowing, nausea or vomiting, abdominal pain. Denies any allergies or other past medical history. No other modifying factors.  History reviewed. No pertinent past medical history. History reviewed. No pertinent past surgical history. History reviewed. No pertinent family history. Social History  Substance Use Topics  . Smoking status: Current Some Day Smoker  . Smokeless tobacco: None  . Alcohol Use: Yes     Comment: occ    Review of Systems A 10 point review of systems was completed and was negative except for pertinent positives and negatives as mentioned in the history of present illness    Allergies  Review of patient's allergies indicates no known allergies.  Home Medications   Prior to Admission medications   Medication Sig Start Date End Date Taking? Authorizing Provider  clindamycin (CLEOCIN) 150 MG capsule Take 3 capsules (450 mg total) by mouth 3 (three) times daily. 02/24/15   Hannah Muthersbaugh, PA-C  diphenhydrAMINE (BENADRYL) 25 MG tablet Take 1 tablet (25 mg total) by mouth every 6 (six) hours. 09/30/15   Joycie Peek, PA-C  ibuprofen (ADVIL,MOTRIN) 800 MG tablet Take 1 tablet (800 mg total) by mouth 3 (three) times daily. 02/24/15   Hannah Muthersbaugh, PA-C  predniSONE (DELTASONE) 20 MG tablet Take 2 tablets (40 mg total) by mouth daily.  09/30/15   Liya Strollo, PA-C   BP 140/80 mmHg  Pulse 16  Temp(Src) 97.5 F (36.4 C) (Oral)  Resp 16  Ht 5\' 3"  (1.6 m)  Wt 49.641 kg  BMI 19.39 kg/m2  SpO2 98% Physical Exam  Constitutional:  Awake, alert, nontoxic appearance.  HENT:  Head: Atraumatic.  Eyes: Right eye exhibits no discharge. Left eye exhibits no discharge.  Neck: Neck supple.  Cardiovascular: Normal rate, regular rhythm and normal heart sounds.   Pulmonary/Chest: Effort normal and breath sounds normal. He exhibits no tenderness.  Abdominal: Soft. There is no tenderness. There is no rebound.  Musculoskeletal: He exhibits no tenderness.  Baseline ROM, no obvious new focal weakness.  Neurological:  Mental status and motor strength appears baseline for patient and situation.  Skin: No rash noted.  Diffuse, urticarial rash to extremities and trunk/back. No drainage, vesicles or overt erythema. No sloughing or scaling. There is mild excoriations.  Psychiatric: He has a normal mood and affect.  Nursing note and vitals reviewed.   ED Course  Procedures (including critical care time) Labs Review Labs Reviewed - No data to display  Imaging Review No results found. I have personally reviewed and evaluated these images and lab results as part of my medical decision-making.   EKG Interpretation None     Meds given in ED:  Medications - No data to display  Discharge Medication List as of 09/30/2015 12:49 PM    START taking these medications   Details  diphenhydrAMINE (BENADRYL) 25 MG tablet Take 1 tablet (25 mg total)  by mouth every 6 (six) hours., Starting 09/30/2015, Until Discontinued, Print    predniSONE (DELTASONE) 20 MG tablet Take 2 tablets (40 mg total) by mouth daily., Starting 09/30/2015, Until Discontinued, Print       Filed Vitals:   09/30/15 1208 09/30/15 1254  BP: 142/79 140/80  Pulse: 85 16  Temp: 97.5 F (36.4 C) 97.5 F (36.4 C)  TempSrc: Oral Oral  Resp: 14 16  Height: 5\' 3"  (1.6 m)    Weight: 49.641 kg   SpO2: 97% 98%    MDM  Aaron Lee is a 25 y.o. male who presents today for rash consistent with contact dermatitis. Symptoms onset after new body wash. No evidence of anaphylaxis. Will DC with Benadryl for itching, short course oral steroids. Encouraged cessation of his body wash. No evidence of other acute or emergent pathology at this time. Overall, patient appears well, nontoxic, hemodynamically stable and appropriate for discharge. Last heart rate recording erroneous. The patient appears reasonably screened and/or stabilized for discharge and I doubt any other medical condition or other [facility] requiring further screening, evaluation, or treatment in the ED at this time prior to discharge.   Final diagnoses:  Rash       Joycie PeekBenjamin Navpreet Szczygiel, PA-C 09/30/15 1322  Eber HongBrian Miller, MD 10/01/15 1148

## 2015-09-30 NOTE — Discharge Instructions (Signed)
Take your medications as we discussed and as prescribed. Follow-up with your doctor as needed. Please avoid using this particular body wash. Return to ED for any new or worsening symptoms.  Allergies An allergy is an abnormal reaction to a substance by the body's defense system (immune system). Allergies can develop at any age. WHAT CAUSES ALLERGIES? An allergic reaction happens when the immune system mistakenly reacts to a normally harmless substance, called an allergen, as if it were harmful. The immune system releases antibodies to fight the substance. Antibodies eventually release a chemical called histamine into the bloodstream. The release of histamine is meant to protect the body from infection, but it also causes discomfort. An allergic reaction can be triggered by:  Eating an allergen.  Inhaling an allergen.  Touching an allergen. WHAT TYPES OF ALLERGIES ARE THERE? There are many types of allergies. Common types include:  Seasonal allergies. People with this type of allergy are usually allergic to substances that are only present during certain seasons, such as molds and pollens.  Food allergies.  Drug allergies.  Insect allergies.  Animal dander allergies. WHAT ARE SYMPTOMS OF ALLERGIES? Possible allergy symptoms include:  Swelling of the lips, face, tongue, mouth, or throat.  Sneezing, coughing, or wheezing.  Nasal congestion.  Tingling in the mouth.  Rash.  Itching.  Itchy, red, swollen areas of skin (hives).  Watery eyes.  Vomiting.  Diarrhea.  Dizziness.  Lightheadedness.  Fainting.  Trouble breathing or swallowing.  Chest tightness.  Rapid heartbeat. HOW ARE ALLERGIES DIAGNOSED? Allergies are diagnosed with a medical and family history and one or more of the following:  Skin tests.  Blood tests.  A food diary. A food diary is a record of all the foods and drinks you have in a day and of all the symptoms you experience.  The results of  an elimination diet. An elimination diet involves eliminating foods from your diet and then adding them back in one by one to find out if a certain food causes an allergic reaction. HOW ARE ALLERGIES TREATED? There is no cure for allergies, but allergic reactions can be treated with medicine. Severe reactions usually need to be treated at a hospital. HOW CAN REACTIONS BE PREVENTED? The best way to prevent an allergic reaction is by avoiding the substance you are allergic to. Allergy shots and medicines can also help prevent reactions in some cases. People with severe allergic reactions may be able to prevent a life-threatening reaction called anaphylaxis with a medicine given right after exposure to the allergen.   This information is not intended to replace advice given to you by your health care provider. Make sure you discuss any questions you have with your health care provider.   Document Released: 12/01/2002 Document Revised: 09/28/2014 Document Reviewed: 06/19/2014 Elsevier Interactive Patient Education [employer]2016 Else[employer] Inc.

## 2015-09-30 NOTE — ED Notes (Signed)
Pt here for rash all over that started after using new body wash.

## 2015-12-24 ENCOUNTER — Emergency Department (HOSPITAL_COMMUNITY)
Admission: EM | Admit: 2015-12-24 | Discharge: 2015-12-24 | Disposition: A | Discharge status: Home / Self Care | Payer: Medicaid Other | Attending: Emergency Medicine | Admitting: Emergency Medicine

## 2015-12-24 ENCOUNTER — Encounter (HOSPITAL_COMMUNITY): Payer: Self-pay | Admitting: Emergency Medicine

## 2015-12-24 DIAGNOSIS — F17200 Nicotine dependence, unspecified, uncomplicated: Secondary | ICD-10-CM | POA: Diagnosis not present

## 2015-12-24 DIAGNOSIS — K644 Residual hemorrhoidal skin tags: Secondary | ICD-10-CM

## 2015-12-24 DIAGNOSIS — K6289 Other specified diseases of anus and rectum: Secondary | ICD-10-CM | POA: Diagnosis present

## 2015-12-24 DIAGNOSIS — Z79899 Other long term (current) drug therapy: Secondary | ICD-10-CM | POA: Insufficient documentation

## 2015-12-24 MED ORDER — IBUPROFEN 400 MG PO TABS
800.0000 mg | ORAL_TABLET | Freq: Once | ORAL | Status: AC
  Administered 2015-12-24: 800 mg via ORAL
  Filled 2015-12-24: qty 2

## 2015-12-24 MED ORDER — IBUPROFEN 800 MG PO TABS
800.0000 mg | ORAL_TABLET | Freq: Three times a day (TID) | ORAL | Status: DC
Start: 2015-12-24 — End: 2017-07-08

## 2015-12-24 MED ORDER — LIDOCAINE-HYDROCORTISONE ACE 3-0.5 % RE CREA: 1.0000 | TOPICAL_CREAM | Freq: Two times a day (BID) | RECTAL | Status: DC

## 2015-12-24 NOTE — ED Notes (Signed)
Pt sts hemorrhoids with pain and itching x 2 days; pt denies bleeding

## 2015-12-24 NOTE — ED Provider Notes (Signed)
CSN: [id number]     Arrival date & time 12/24/15  7829 History   First MD Initiated Contact with Patient 12/24/15 [phone]     Chief Complaint  Patient presents with  . Hemorrhoids     HPI  HPI Comments:  Aaron Lee is a 25 y.o. male who presents to the Emergency Department complaining of perirectal pain that he believes is hemorrhoids that started two days ago. He reports minimal itching. He states he feels "bumps" around anal area. He has not done anything to treat the symptoms. He denies modifying factors. He denies abdominal pain, abnormal bowel movements, fever, chills, nausea, vomiting or anal intercourse.   History reviewed. No pertinent past medical history. History reviewed. No pertinent past surgical history. History reviewed. No pertinent family history. Social History  Substance Use Topics  . Smoking status: Current Some Day Smoker  . Smokeless tobacco: None  . Alcohol Use: Yes     Comment: occ    Review of Systems  All other systems reviewed and are negative.     Allergies  Review of patient's allergies indicates no known allergies.  Home Medications   Prior to Admission medications   Medication Sig Start Date End Date Taking? Authorizing Provider  clindamycin (CLEOCIN) 150 MG capsule Take 3 capsules (450 mg total) by mouth 3 (three) times daily. 02/24/15   Hannah Muthersbaugh, PA-C  diphenhydrAMINE (BENADRYL) 25 MG tablet Take 1 tablet (25 mg total) by mouth every 6 (six) hours. 09/30/15   Joycie Peek, PA-C  ibuprofen (ADVIL,MOTRIN) 800 MG tablet Take 1 tablet (800 mg total) by mouth 3 (three) times daily. 02/24/15   Hannah Muthersbaugh, PA-C  ibuprofen (ADVIL,MOTRIN) 800 MG tablet Take 1 tablet (800 mg total) by mouth 3 (three) times daily. 12/24/15   Ace Gins Lis Savitt, PA-C  lidocaine-hydrocortisone (ANAMANTEL HC) 3-0.5 % CREA Place 1 Applicatorful rectally 2 (two) times daily. 12/24/15   Ace Gins Jocilyn Trego, PA-C  predniSONE (DELTASONE) 20 MG tablet Take 2 tablets (40 mg  total) by mouth daily. 09/30/15   Joycie Peek, PA-C   BP 118/82 mmHg  Pulse 83  Temp(Src) 98.1 F (36.7 C) (Oral)  Resp 18  SpO2 100% Physical Exam  Constitutional: He is oriented to person, place, and time. No distress.  HENT:  Head: Atraumatic.  Right Ear: External ear normal.  Left Ear: External ear normal.  Nose: Nose normal.  Eyes: Conjunctivae are normal. No scleral icterus.  Neck: Normal range of motion. Neck supple.  Cardiovascular: Normal rate and regular rhythm.   Pulmonary/Chest: Effort normal. No respiratory distress. He exhibits no tenderness.  Abdominal: Soft. Bowel sounds are normal. He exhibits no distension. There is no tenderness.  Genitourinary:  Chaperone present. On DRE there are multiple external hemorrhoids visualized. TTP. Soft, not thrombosed. No bleeding. No vesicles or pustules. No internal lesions palpated.  Neurological: He is alert and oriented to person, place, and time.  Skin: Skin is warm and dry. He is not diaphoretic.  Psychiatric: He has a normal mood and affect. His behavior is normal.  Nursing note and vitals reviewed.   ED Course  Procedures (including critical care time) Labs Review Labs Reviewed - No data to display  Imaging Review No results found. I have personally reviewed and evaluated these images and lab results as part of my medical decision-making.   EKG Interpretation None      MDM   Final diagnoses:  External hemorrhoids    Exam consistent with nonthrombosed external hemorrhoids. Pt has  not tried anything to alleviate his symptoms to this point. I discussed conservative non-surgical management with the pt including PO NSAIDs, sitz baths, increasing dietary fiber. He would like to try a topical medication as well which I think is reasonable. Rx given for lidocaine/hydrocortisone with instructions to not use for longer than one week. Pt otherwise afebrile and nontoxic appearing. HE is stable for discharge. ER return  precautions given.    Carlene CoriaSerena Y Jazleen Robeck, PA-C 12/24/15 [id number]  Raeford RazorStephen Kohut, MD 12/26/15 [phone]

## 2015-12-24 NOTE — ED Notes (Signed)
C/o hemorrhoidal pain. Denies bleeding.

## 2015-12-24 NOTE — Discharge Instructions (Signed)

## 2016-07-15 ENCOUNTER — Ambulatory Visit (HOSPITAL_COMMUNITY)
Admission: EM | Admit: 2016-07-15 | Discharge: 2016-07-15 | Disposition: A | Discharge status: Home / Self Care | Payer: Medicaid Other | Attending: Emergency Medicine | Admitting: Emergency Medicine

## 2016-07-15 ENCOUNTER — Encounter (HOSPITAL_COMMUNITY): Payer: Self-pay | Admitting: *Deleted

## 2016-07-15 DIAGNOSIS — K529 Noninfective gastroenteritis and colitis, unspecified: Secondary | ICD-10-CM

## 2016-07-15 MED ORDER — ONDANSETRON HCL 4 MG/2ML IJ SOLN
4.0000 mg | Freq: Once | INTRAMUSCULAR | Status: AC
  Administered 2016-07-15: 4 mg via INTRAMUSCULAR

## 2016-07-15 MED ORDER — ONDANSETRON HCL 4 MG/2ML IJ SOLN
INTRAMUSCULAR | Status: AC
  Filled 2016-07-15: qty 2

## 2016-07-15 MED ORDER — ONDANSETRON HCL 4 MG PO TABS: 4.0000 mg | ORAL_TABLET | Freq: Four times a day (QID) | ORAL | 0 refills | Status: DC

## 2016-07-15 NOTE — ED Provider Notes (Signed)
CSN: [id number]     Arrival date & time 07/15/16  1700 History   First MD Initiated Contact with Patient 07/15/16 1842     Chief Complaint  Patient presents with  . Emesis   (Consider location/radiation/quality/duration/timing/severity/associated sxs/prior Treatment) HPI NP PT IS 25 Y/O MALE WITH [employer] FOR THE LAST 3 DAYS. STATES HE IS UNABLE TO TAKE FLUIDS. FEELS WEAK NO FEVER. NO BLOOD IN STOOL. 4 DIARRHEA STOOLS IN 3 DAYS. NO WATERY. History reviewed. No pertinent past medical history. History reviewed. No pertinent surgical history. History reviewed. No pertinent family history. Social History  Substance Use Topics  . Smoking status: Current Some Day Smoker  . Smokeless tobacco: Not on file  . Alcohol use Yes     Comment: occ    Review of Systems  Denies: HEADACHE, NAUSEA, ABDOMINAL PAIN, CHEST PAIN, CONGESTION, DYSURIA, SHORTNESS OF BREATH  Allergies  Review of patient's allergies indicates no known allergies.  Home Medications   Prior to Admission medications   Medication Sig Start Date End Date Taking? Authorizing Provider  clindamycin (CLEOCIN) 150 MG capsule Take 3 capsules (450 mg total) by mouth 3 (three) times daily. 02/24/15   Hannah Muthersbaugh, PA-C  diphenhydrAMINE (BENADRYL) 25 MG tablet Take 1 tablet (25 mg total) by mouth every 6 (six) hours. 09/30/15   Joycie Peek, PA-C  ibuprofen (ADVIL,MOTRIN) 800 MG tablet Take 1 tablet (800 mg total) by mouth 3 (three) times daily. 02/24/15   Hannah Muthersbaugh, PA-C  ibuprofen (ADVIL,MOTRIN) 800 MG tablet Take 1 tablet (800 mg total) by mouth 3 (three) times daily. 12/24/15   Ace Gins Sam, PA-C  lidocaine-hydrocortisone (ANAMANTEL HC) 3-0.5 % CREA Place 1 Applicatorful rectally 2 (two) times daily. 12/24/15   Ace Gins Sam, PA-C  ondansetron (ZOFRAN) 4 MG tablet Take 1 tablet (4 mg total) by mouth every 6 (six) hours. 07/15/16   Tharon Aquas, PA  predniSONE (DELTASONE) 20 MG tablet Take 2 tablets (40 mg total) by mouth  daily. 09/30/15   Joycie Peek, PA-C   Meds Ordered and Administered this Visit   Medications  ondansetron [facility]) injection 4 mg (4 mg Intramuscular Given 07/15/16 1849)    BP 98/55 (BP Location: Right Arm)   Pulse 88   Temp 98.6 F (37 C) (Oral)   Resp 20   SpO2 99%  No data found.   Physical Exam NURSES NOTES AND VITAL SIGNS REVIEWED. CONSTITUTIONAL: Well developed, well nourished, no acute distress HEENT: normocephalic, atraumatic EYES: Conjunctiva normal NECK:normal ROM, supple, no adenopathy PULMONARY:No respiratory distress, normal effort ABDOMINAL: Soft, ND, NT BS+, No CVAT MUSCULOSKELETAL: Normal ROM of all extremities,  SKIN: warm and dry without rash PSYCHIATRIC: Mood and affect, behavior are normal  Urgent Care Course   Clinical Course  PT LOOKS WELL. WELL HYDRATED TAKING SIPS OF GATORAIDE.  RX FOR ZOFRAN  Procedures (including critical care time)  Labs Review Labs Reviewed - No data to display  Imaging Review No results found.   Visual Acuity Review  Right Eye Distance:   Left Eye Distance:   Bilateral Distance:    Right Eye Near:   Left Eye Near:    Bilateral Near:         MDM   1. Gastroenteritis     Patient is reassured that there are no issues that require transfer to higher level of care at this time or additional tests. Patient is advised to continue home symptomatic treatment. Patient is advised that if there are new or worsening symptoms  to attend the emergency department, contact primary care provider, or return to UC. Instructions of care provided discharged home in stable condition.    THIS NOTE WAS GENERATED USING A VOICE RECOGNITION SOFTWARE PROGRAM. ALL REASONABLE EFFORTS  WERE MADE TO PROOFREAD THIS DOCUMENT FOR ACCURACY.  I have verbally reviewed the discharge instructions with the patient. A printed AVS was given to the patient.  All questions were answered prior to discharge.      Tharon AquasFrank C Arick Mareno,  PA 07/15/16 2102

## 2016-07-15 NOTE — ED Triage Notes (Signed)
Pt  Reports       Nausea   Vomiting  Diarrhea   X   3  Days     Dry  Mucous  Membranes     Unable  To  Tolerate  Po   Fluids   Without  Vomiting    Feels  Weak  As   Well

## 2016-08-26 ENCOUNTER — Encounter (HOSPITAL_COMMUNITY): Payer: Self-pay | Admitting: *Deleted

## 2016-08-26 ENCOUNTER — Emergency Department (HOSPITAL_COMMUNITY)
Admission: EM | Admit: 2016-08-26 | Discharge: 2016-08-26 | Disposition: A | Discharge status: Home / Self Care | Payer: Medicaid Other | Attending: Emergency Medicine | Admitting: Emergency Medicine

## 2016-08-26 DIAGNOSIS — K644 Residual hemorrhoidal skin tags: Secondary | ICD-10-CM | POA: Diagnosis not present

## 2016-08-26 DIAGNOSIS — K6289 Other specified diseases of anus and rectum: Secondary | ICD-10-CM | POA: Diagnosis present

## 2016-08-26 DIAGNOSIS — F17200 Nicotine dependence, unspecified, uncomplicated: Secondary | ICD-10-CM | POA: Diagnosis not present

## 2016-08-26 NOTE — ED Provider Notes (Signed)
[facility]-EMERGENCY DEPT Provider Note   CSN: [id number] Arrival date & time: 08/26/16  1427  By signing my name below, I, Aaron Lee, attest that this documentation has been prepared under the direction and in the presence of physician practitioner, Nira ConnPedro Eduardo Aftyn Nott, MD. Electronically Signed: Linna Darnerussell Lee, Scribe. 08/26/2016. 4:25 PM.  History   Chief Complaint Chief Complaint  Patient presents with  . Hemorrhoids  . Rectal Pain    The history is provided by the patient. No language interpreter was used.     HPI Comments: Aaron Lee is a [age] y.o. male with PMHx significant for hemorrhoids who presents to the Emergency Department complaining of sudden onset, constant, rectal pain beginning 3 days ago. He notes a h/o hemorrhoids and states his current pain feels the same. He endorses pain exacerbation with pressure to his rectum. No alleviating factors noted. He is not sexually active currently but when he is it is only with females; no anal intercourse or penetration. He denies constipation, hematochezia, abdominal pain, fever, chills, nausea, vomiting, diarrhea, CP, SOB, or any other associated symptoms.  History reviewed. No pertinent past medical history.  There are no active problems to display for this patient.   History reviewed. No pertinent surgical history.     Home Medications    Prior to Admission medications   Medication Sig Start Date End Date Taking? Authorizing Provider  clindamycin (CLEOCIN) 150 MG capsule Take 3 capsules (450 mg total) by mouth 3 (three) times daily. Patient not taking: Reported on 08/26/2016 02/24/15   Dahlia ClientHannah Muthersbaugh, PA-C  diphenhydrAMINE (BENADRYL) 25 MG tablet Take 1 tablet (25 mg total) by mouth every 6 (six) hours. Patient not taking: Reported on 08/26/2016 09/30/15   Joycie PeekBenjamin Cartner, PA-C  ibuprofen (ADVIL,MOTRIN) 800 MG tablet Take 1 tablet (800 mg total) by mouth 3 (three) times daily. Patient not taking: Reported on  08/26/2016 02/24/15   Dahlia ClientHannah Muthersbaugh, PA-C  ibuprofen (ADVIL,MOTRIN) 800 MG tablet Take 1 tablet (800 mg total) by mouth 3 (three) times daily. Patient not taking: Reported on 08/26/2016 12/24/15   Ace GinsSerena Y Sam, PA-C  lidocaine-hydrocortisone [facility](ANAMANTEL HC) 3-0.5 % CREA Place 1 Applicatorful rectally 2 (two) times daily. Patient not taking: Reported on 08/26/2016 12/24/15   Ace GinsSerena Y Sam, PA-C  ondansetron (ZOFRAN) 4 MG tablet Take 1 tablet (4 mg total) by mouth every 6 (six) hours. Patient not taking: Reported on 08/26/2016 07/15/16   Tharon AquasFrank C Patrick, PA  predniSONE (DELTASONE) 20 MG tablet Take 2 tablets (40 mg total) by mouth daily. Patient not taking: Reported on 08/26/2016 09/30/15   Joycie PeekBenjamin Cartner, PA-C    Family History History reviewed. No pertinent family history.  Social History Social History  Substance Use Topics  . Smoking status: Current Some Day Smoker  . Smokeless tobacco: Not on file  . Alcohol use Yes     Comment: occ     Allergies   Patient has no known allergies.   Review of Systems Review of Systems  A complete 10 system review of systems was obtained and all systems are negative except as noted in the HPI and PMH.   Physical Exam Updated Vital Signs BP 125/90 (BP Location: Right Arm)   Pulse 88   Temp 98.6 F (37 C) (Oral)   Resp 19   SpO2 97%   Physical Exam  Constitutional: He is oriented to person, place, and time. He appears well-developed and well-nourished. No distress.  HENT:  Head: Normocephalic and atraumatic.  Nose: Nose normal.  Eyes: Conjunctivae and EOM are normal. Pupils are equal, round, and reactive to light. Right eye exhibits no discharge. Left eye exhibits no discharge. No scleral icterus.  Neck: Normal range of motion. Neck supple.  Cardiovascular: Normal rate and regular rhythm.  Exam reveals no gallop and no friction rub.   No murmur heard. Pulmonary/Chest: Effort normal and breath sounds normal. No stridor. No respiratory  distress. He has no rales.  Abdominal: Soft. He exhibits no distension. There is no tenderness.  Genitourinary:  Genitourinary Comments: Rectal exam: hemorrhoids at 1 o'clock, 3 o'clock, and 5 o'clock.  Musculoskeletal: He exhibits no edema or tenderness.  Neurological: He is alert and oriented to person, place, and time.  Skin: Skin is warm and dry. No rash noted. He is not diaphoretic. No erythema.  Psychiatric: He has a normal mood and affect.  Vitals reviewed.     ED Treatments / Results  Labs (all labs ordered are listed, but only abnormal results are displayed) Labs Reviewed - No data to display  EKG  EKG Interpretation None       Radiology No results found.  Procedures Procedures (including critical care time)  DIAGNOSTIC STUDIES: Oxygen Saturation is 98% on RA, normal by my interpretation.    COORDINATION OF CARE: 4:31 PM Discussed treatment plan with pt at bedside and pt agreed to plan.  Medications Ordered in ED Medications - No data to display   Initial Impression / Assessment and Plan / ED Course  I have reviewed the triage vital signs and the nursing notes.  Pertinent labs & imaging results that were available during my care of the patient were reviewed by me and considered in my medical decision making (see chart for details).  Clinical Course as of Aug 26 1642  Wed Aug 26, 2016  1630 Nonthrombosed external hemorrhoids that are tender to palpation. Patient denies any hematochezia. No anal intercourse or penetration.  Discussed symptomatic treatment for hemorrhoids and additional treatment with stool softeners.  The patient is safe for discharge with strict return precautions.   [PC]    Clinical Course User Index [PC] Nira ConnPedro Eduardo Ritu Gagliardo, MD      Final Clinical Impressions(s) / ED Diagnoses   Final diagnoses:  External hemorrhoids    Disposition: Discharge  Condition: Good  I have discussed the results, Dx and Tx plan with the  patient who expressed understanding and agree(s) with the plan. Discharge instructions discussed at great length. The patient was given strict return precautions who verbalized understanding of the instructions. No further questions at time of discharge.    Current Discharge Medication List      Follow Up: primary care provider       I personally performed the services described in this documentation, which was scribed in my presence. The recorded information has been reviewed and is accurate.        Nira ConnPedro Eduardo Zade Falkner, MD 08/26/16 [phone]

## 2016-08-26 NOTE — ED Triage Notes (Signed)
Pt reports having hemorrhoids and rectal pain, hx of same. No acute distress noted at triage.

## 2016-08-26 NOTE — Discharge Instructions (Signed)
Use over the counter Preparation H or other Hemorrhoidal cream. You can also place NeoSporin with Lidocaine. In addition, use MiraLax or another stool softner to prevent additional hemorrhoids.

## 2017-03-30 ENCOUNTER — Ambulatory Visit (INDEPENDENT_AMBULATORY_CARE_PROVIDER_SITE_OTHER): Payer: Medicaid Other | Admitting: Internal Medicine

## 2017-03-30 VITALS — BP 129/110 | HR 78 | Temp 98.1°F | Wt 105.8 lb

## 2017-03-30 DIAGNOSIS — Z79899 Other long term (current) drug therapy: Secondary | ICD-10-CM | POA: Diagnosis not present

## 2017-03-30 DIAGNOSIS — J302 Other seasonal allergic rhinitis: Secondary | ICD-10-CM | POA: Diagnosis not present

## 2017-03-30 DIAGNOSIS — F17210 Nicotine dependence, cigarettes, uncomplicated: Secondary | ICD-10-CM | POA: Diagnosis not present

## 2017-03-30 DIAGNOSIS — R21 Rash and other nonspecific skin eruption: Secondary | ICD-10-CM | POA: Diagnosis present

## 2017-03-30 MED ORDER — FLUTICASONE PROPIONATE 50 MCG/ACT NA SUSP: 2.0000 | Freq: Every day | NASAL | 2 refills | Status: DC

## 2017-03-30 MED ORDER — DIPHENHYDRAMINE HCL 25 MG PO TABS: 25.0000 mg | ORAL_TABLET | Freq: Four times a day (QID) | ORAL | 0 refills | Status: DC | PRN

## 2017-03-30 MED ORDER — HYDROCORTISONE 2.5 % EX CREA: TOPICAL_CREAM | Freq: Two times a day (BID) | CUTANEOUS | 0 refills | Status: DC

## 2017-03-30 NOTE — Patient Instructions (Addendum)
[name],  It was a pleasure meeting you today.  Please start taking benadryl and use hydrocortisone cream for your rash. Please stop using your current body wash.  Please avoid body wash that has scents or dyes in them.   Please use flonase for your nasal congestion

## 2017-03-30 NOTE — Progress Notes (Signed)
   CC: Acute Rash on shoulders bilaterally   HPI:  [name] is a 26 y.o. male with history of external hemorrhoids that presents to the internal medicine clinic for one-month history of pruritic rash located bilaterally on shoulders and tighs. He states that he had a similar rash in January of last year and was attributed to body wash. He states that the current rash started after using similar body wash. He states he has tried a cream for his symptoms with little benefit but cannot tell me the name of the cream. He denies fever/chills, tenderness to rash or abdominal pain.      Review of Systems:  Review of Systems  Constitutional: Negative for chills and fever.  Musculoskeletal: Negative for myalgias.     Physical Exam:  Vitals:   03/30/17 1030  BP: (!) 129/110  Pulse: 78  Temp: 98.1 F (36.7 C)  TempSrc: Oral  SpO2: 100%  Weight: 105 lb 12.8 oz (48 kg)   Physical Exam  Constitutional: He is well-developed, well-nourished, and in no distress.  Cardiovascular: Normal rate, regular rhythm and normal heart sounds.  Exam reveals no gallop and no friction rub.   No murmur heard. Pulmonary/Chest: Effort normal and breath sounds normal. No respiratory distress. He has no wheezes. He has no rales.  Skin:  Excoriations that are healed on shoulders bilaterally.  Minimal small raised bumps on shoulders and thigh No erythema noted    Assessment & Plan:   See encounters tab for problem based medical decision making.   Patient discussed with Dr. Criselda PeachesMullen

## 2017-03-31 DIAGNOSIS — L309 Dermatitis, unspecified: Secondary | ICD-10-CM | POA: Insufficient documentation

## 2017-03-31 DIAGNOSIS — L209 Atopic dermatitis, unspecified: Secondary | ICD-10-CM | POA: Insufficient documentation

## 2017-03-31 DIAGNOSIS — J302 Other seasonal allergic rhinitis: Secondary | ICD-10-CM | POA: Insufficient documentation

## 2017-03-31 NOTE — Assessment & Plan Note (Signed)
Assessment: Allergic Rhinitis Patient presents with runny nose that is worse when he is outside. He states that he has used nasal sprays in the past with benefit. He denies fever/chills, cough or shortness of breath.  Will give a prescription for Flonase  Plan -Flonase

## 2017-03-31 NOTE — Progress Notes (Signed)
Internal Medicine Clinic Attending  Case discussed with Dr. Hoffman at the time of the visit.  We reviewed the resident's history and exam and pertinent patient test results.  I agree with the assessment, diagnosis, and plan of care documented in the resident's note.  

## 2017-03-31 NOTE — Assessment & Plan Note (Addendum)
Assessment: Acute rash on shoulders bilaterally Patient presents with a one-month history of itching on his shoulders and thighs bilaterally. He states that the itching started after using body wash.  He has a history of rash in January 2017 for similar situation of which he was treated with benadryl and oral steroids.  On exam minimal raised bumps were noted on shoulders and thighs bilaterally.  Scratch marks were noted on the shoulders.  Rash and history is consistent with contact dermatitis.Will treat with Benadryl and hydrocortisone 2.5% cream   Plan -Benadryl -hydrocortisone 2.5% cream - told patient to stop body wash and avoid body wash with scents and dyes

## 2017-06-28 NOTE — Progress Notes (Signed)
   CC: follow up of rash  HPI:  [name] is a 26 y.o. with PMH contact dermatitis who presents for follow upof the rash. Please see the assessment and plans for the status of the patient chronic medical problems.   No past medical history on file. Review of Systems:  Refer to history of present illness and assessment and plans for pertinent review of systems, all others reviewed and negative  Physical Exam:  Vitals:   06/29/17 1438  BP: 105/64   General: well appearing, skinny, mild cognitive slowing  cardiac regular rate and rhythm, no murmurs Lungs clear to auscultation bilateral Skin: erythematous papules with overlying excoriations over his abdomen, shoulders, and the hairline of his neck, no crusting lesions  Abdomen: scaphoid  Assessment & Plan:   Contact dermatitis  Presents today with a pruritic rash which was thought to be related to his fragrant body wash at last office visit. At that time he was prescribed hydrocortisone cream and advised to take Benadryl and stop using the body wash. The rash improved somewhat with hydrocortisone cream however he switch to another soap which is still fragrant and the rash developed again. He has not tried Benadryl yet. - Advised the use of Dove body soap without fragrance and avoiding fragrant laundry detergents - refilled hydrocortisone cream - encouraged trial of Benadryl  Healthcare maintenance  - flu vaccine given today   See Encounters Tab for problem based charting.  Patient seen with Dr. Oswaldo Lee

## 2017-06-29 ENCOUNTER — Encounter (INDEPENDENT_AMBULATORY_CARE_PROVIDER_SITE_OTHER): Payer: Self-pay

## 2017-06-29 ENCOUNTER — Ambulatory Visit (INDEPENDENT_AMBULATORY_CARE_PROVIDER_SITE_OTHER): Payer: Medicaid Other | Admitting: Internal Medicine

## 2017-06-29 DIAGNOSIS — Z23 Encounter for immunization: Secondary | ICD-10-CM

## 2017-06-29 DIAGNOSIS — R21 Rash and other nonspecific skin eruption: Secondary | ICD-10-CM | POA: Diagnosis not present

## 2017-06-29 MED ORDER — HYDROCORTISONE 2.5 % EX CREA: TOPICAL_CREAM | Freq: Two times a day (BID) | CUTANEOUS | 0 refills | Status: DC

## 2017-06-29 MED ORDER — LIDOCAINE-HYDROCORTISONE ACE 3-0.5 % RE CREA: 1.0000 | TOPICAL_CREAM | Freq: Two times a day (BID) | RECTAL | 0 refills | Status: DC

## 2017-06-29 NOTE — Assessment & Plan Note (Signed)
Presents today with a pruritic rash which was thought to be related to his fragrant body wash at last office visit. At that time he was prescribed hydrocortisone cream and advised to take Benadryl and stop using the body wash. The rash improved somewhat with hydrocortisone cream however he switch to another soap which is still fragrant and the rash developed again. He has not tried Benadryl yet. - Advised the use of Dove body soap without fragrance and avoiding fragrant laundry detergents - refilled hydrocortisone cream - encouraged trial of Benadryl

## 2017-06-29 NOTE — Patient Instructions (Addendum)
It was a pleasure to see you today patient - For your itchy skin- try taking benadryl to relieve your itch, I have refilled your allergy cream but this can also be found over the counter, Stay away from soaps and laundry detergent with fragrance in them, the best body soap for your allergy is Dove.  - Please call our clinic if you have any problems or questions, we may be able to help you and keep you from a long emergency room wait. Our clinic and after hours phone number is [phone]       Mediterranean Diet  Why follow it? Research shows. . Those who follow the Mediterranean diet have a reduced risk of heart disease  . The diet is associated with a reduced incidence of Parkinson's and Alzheimer's diseases . People following the diet may have longer life expectancies and lower rates of chronic diseases  . The Dietary Guidelines for Americans recommends the Mediterranean diet as an eating plan to promote health and prevent disease  What Is the Mediterranean Diet?  . Healthy eating plan based on typical foods and recipes of Mediterranean-style cooking . The diet is primarily a plant based diet; these foods should make up a majority of meals   Starches - Plant based foods should make up a majority of meals - They are an important sources of vitamins, minerals, energy, antioxidants, and fiber - Choose whole grains, foods high in fiber and minimally processed items  - Typical grain sources include wheat, oats, barley, corn, brown rice, bulgar, farro, millet, polenta, couscous  - Various types of beans include chickpeas, lentils, fava beans, black beans, white beans   Fruits  Veggies - Large quantities of antioxidant rich fruits & veggies; 6 or more servings  - Vegetables can be eaten raw or lightly drizzled with oil and cooked  - Vegetables common to the traditional Mediterranean Diet include: artichokes, arugula, beets, broccoli, brussel sprouts, cabbage, carrots, celery, collard greens,  cucumbers, eggplant, kale, leeks, lemons, lettuce, mushrooms, okra, onions, peas, peppers, potatoes, pumpkin, radishes, rutabaga, shallots, spinach, sweet potatoes, turnips, zucchini - Fruits common to the Mediterranean Diet include: apples, apricots, avocados, cherries, clementines, dates, figs, grapefruits, grapes, melons, nectarines, oranges, peaches, pears, pomegranates, strawberries, tangerines  Fats - Replace butter and margarine with healthy oils, such as olive oil, canola oil, and tahini  - Limit nuts to no more than a handful a day  - Nuts include walnuts, almonds, pecans, pistachios, pine nuts  - Limit or avoid candied, honey roasted or heavily salted nuts - Olives are central to the [employer] - can be eaten whole or used in a variety of dishes   Meats Protein - Limiting red meat: no more than a few times a month - When eating red meat: choose lean cuts and keep the portion to the size of deck of cards - Eggs: approx. 0 to 4 times a week  - Fish and lean poultry: at least 2 a week  - Healthy protein sources include, chicken, [place], lean beef, lamb - Increase intake of seafood such as tuna, salmon, trout, mackerel, shrimp, scallops - Avoid or limit high fat processed meats such as sausage and bacon  Dairy - Include moderate amounts of low fat dairy products  - Focus on healthy dairy such as fat free yogurt, skim milk, low or reduced fat cheese - Limit dairy products higher in fat such as whole or 2% milk, cheese, ice cream  Alcohol - Moderate amounts of red wine  is ok  - No more than 5 oz daily for women (all ages) and men older than age 35  - No more than 10 oz of wine daily for men younger than 4  Other - Limit sweets and other desserts  - Use herbs and spices instead of salt to flavor foods  - Herbs and spices common to the traditional Mediterranean Diet include: basil, bay leaves, chives, cloves, cumin, fennel, garlic, lavender, marjoram, mint, oregano, parsley, pepper,  rosemary, sage, savory, sumac, tarragon, thyme   It's not just a diet, it's a lifestyle:  . The Mediterranean diet includes lifestyle factors typical of those in the region  . Foods, drinks and meals are best eaten with others and savored . Daily physical activity is important for overall good health . This could be strenuous exercise like running and aerobics . This could also be more leisurely activities such as walking, housework, yard-work, or taking the stairs . Moderation is the key; a balanced and healthy diet accommodates most foods and drinks . Consider portion sizes and frequency of consumption of certain foods   Meal Ideas & Options:  . Breakfast:  o Whole wheat toast or whole wheat English muffins with peanut butter & hard boiled egg o Steel cut oats topped with apples & cinnamon and skim milk  o Fresh fruit: banana, strawberries, melon, berries, peaches  o Smoothies: strawberries, bananas, greek yogurt, peanut butter o Low fat greek yogurt with blueberries and granola  o Egg white omelet with spinach and mushrooms o Breakfast couscous: whole wheat couscous, apricots, skim milk, cranberries  . Sandwiches:  o Hummus and grilled vegetables (peppers, zucchini, squash) on whole wheat bread   o Grilled chicken on whole wheat pita with lettuce, tomatoes, cucumbers or tzatziki  o Tuna salad on whole wheat bread: tuna salad made with greek yogurt, olives, red peppers, capers, green onions o Garlic rosemary lamb pita: lamb sauted with garlic, rosemary, salt & pepper; add lettuce, cucumber, greek yogurt to pita - flavor with lemon juice and black pepper  . Seafood:  o Mediterranean grilled salmon, seasoned with garlic, basil, parsley, lemon juice and black pepper o Shrimp, lemon, and spinach whole-grain pasta salad made with low fat greek yogurt  o Seared scallops with lemon orzo  o Seared tuna steaks seasoned salt, pepper, coriander topped with tomato mixture of olives, tomatoes,  olive oil, minced garlic, parsley, green onions and cappers  . Meats:  o Herbed greek chicken salad with kalamata olives, cucumber, feta  o Red bell peppers stuffed with spinach, bulgur, lean ground beef (or lentils) & topped with feta   o Kebabs: skewers of chicken, tomatoes, onions, zucchini, squash  o [place] burgers: made with red onions, mint, dill, lemon juice, feta cheese topped with roasted red peppers . Vegetarian o Cucumber salad: cucumbers, artichoke hearts, celery, red onion, feta cheese, tossed in olive oil & lemon juice  o Hummus and whole grain pita points with a greek salad (lettuce, tomato, feta, olives, cucumbers, red onion) o Lentil soup with celery, carrots made with vegetable broth, garlic, salt and pepper  o Tabouli salad: parsley, bulgur, mint, scallions, cucumbers, tomato, radishes, lemon juice, olive oil, salt and pepper.

## 2017-06-30 ENCOUNTER — Encounter (HOSPITAL_COMMUNITY): Payer: Self-pay | Admitting: Emergency Medicine

## 2017-06-30 DIAGNOSIS — R1310 Dysphagia, unspecified: Secondary | ICD-10-CM | POA: Diagnosis present

## 2017-06-30 DIAGNOSIS — L309 Dermatitis, unspecified: Secondary | ICD-10-CM | POA: Insufficient documentation

## 2017-06-30 DIAGNOSIS — Z79899 Other long term (current) drug therapy: Secondary | ICD-10-CM | POA: Insufficient documentation

## 2017-06-30 DIAGNOSIS — R112 Nausea with vomiting, unspecified: Secondary | ICD-10-CM | POA: Insufficient documentation

## 2017-06-30 DIAGNOSIS — K219 Gastro-esophageal reflux disease without esophagitis: Secondary | ICD-10-CM | POA: Insufficient documentation

## 2017-06-30 NOTE — ED Triage Notes (Signed)
Pt states when he eats or drinks anything it feels like it gets stuck in his esophagus  Pt states he vomits at night  Pt states this has been going on 3-4 weeks  Pt states he saw his dr yesterday and was told to take some tums

## 2017-06-30 NOTE — Progress Notes (Signed)
Internal Medicine Clinic Attending  I saw and evaluated the patient.  I personally confirmed the key portions of the history and exam documented by Dr. Blum and I reviewed pertinent patient test results.  The assessment, diagnosis, and plan were formulated together and I agree with the documentation in the resident's note. 

## 2017-07-01 ENCOUNTER — Emergency Department (HOSPITAL_COMMUNITY)
Admission: EM | Admit: 2017-07-01 | Discharge: 2017-07-01 | Disposition: A | Discharge status: Home / Self Care | Payer: Medicaid Other | Attending: Emergency Medicine | Admitting: Emergency Medicine

## 2017-07-01 DIAGNOSIS — K219 Gastro-esophageal reflux disease without esophagitis: Secondary | ICD-10-CM

## 2017-07-01 DIAGNOSIS — R1310 Dysphagia, unspecified: Secondary | ICD-10-CM

## 2017-07-01 DIAGNOSIS — R1319 Other dysphagia: Secondary | ICD-10-CM

## 2017-07-01 DIAGNOSIS — L309 Dermatitis, unspecified: Secondary | ICD-10-CM

## 2017-07-01 MED ORDER — PANTOPRAZOLE SODIUM 20 MG PO TBEC: 20.0000 mg | DELAYED_RELEASE_TABLET | Freq: Every day | ORAL | 3 refills | Status: DC

## 2017-07-01 MED ORDER — TRIAMCINOLONE 0.1 % CREAM:EUCERIN CREAM 1:1: 1.0000 "application " | TOPICAL_CREAM | Freq: Two times a day (BID) | CUTANEOUS | 2 refills | Status: DC

## 2017-07-01 MED ORDER — SUCRALFATE 1 G PO TABS: 1.0000 g | ORAL_TABLET | Freq: Three times a day (TID) | ORAL | 0 refills | Status: DC

## 2017-07-01 NOTE — ED Provider Notes (Signed)
[facility]-EMERGENCY DEPT Provider Note   CSN: [id number] Arrival date & time: 06/30/17  2100     History   Chief Complaint Chief Complaint  Patient presents with  . Gastroesophageal Reflux    HPI Aaron Lee is a 26 y.o. male.  Has had sensation of food sticking in his esophagus and pain with eating and swallowing for 3-4 weeks. Occasionally at night becomes nauseated and vomits. No abdominal pain. No constipation or diarrhea. No fever.      History reviewed. No pertinent past medical history.  Patient Active Problem List   Diagnosis Date Noted  . Rash 03/31/2017  . Seasonal allergic rhinitis 03/31/2017    History reviewed. No pertinent surgical history.     Home Medications    Prior to Admission medications   Medication Sig Start Date End Date Taking? Authorizing Provider  clindamycin (CLEOCIN) 150 MG capsule Take 3 capsules (450 mg total) by mouth 3 (three) times daily. Patient not taking: Reported on 08/26/2016 02/24/15   Muthersbaugh, Dahlia Client, PA-C  diphenhydrAMINE (BENADRYL) 25 MG tablet Take 1 tablet (25 mg total) by mouth every 6 (six) hours as needed for itching. Patient not taking: Reported on 07/01/2017 03/30/17   Geralyn Corwin Ratliff, DO  fluticasone (FLONASE) 50 MCG/ACT nasal spray Place 2 sprays into both nostrils daily. Patient not taking: Reported on 07/01/2017 03/30/17   Geralyn Corwin Ratliff, DO  hydrocortisone 2.5 % cream Apply topically 2 (two) times daily. Patient not taking: Reported on 07/01/2017 06/29/17   Eulah Pont, MD  ibuprofen (ADVIL,MOTRIN) 800 MG tablet Take 1 tablet (800 mg total) by mouth 3 (three) times daily. Patient not taking: Reported on 08/26/2016 02/24/15   Muthersbaugh, Dahlia Client, PA-C  ibuprofen (ADVIL,MOTRIN) 800 MG tablet Take 1 tablet (800 mg total) by mouth 3 (three) times daily. Patient not taking: Reported on 08/26/2016 12/24/15   Sam, Ace Gins, PA-C  ondansetron (ZOFRAN) 4 MG tablet Take 1 tablet (4 mg total) by mouth every  6 (six) hours. Patient not taking: Reported on 08/26/2016 07/15/16   Tharon Aquas, PA  predniSONE (DELTASONE) 20 MG tablet Take 2 tablets (40 mg total) by mouth daily. Patient not taking: Reported on 08/26/2016 09/30/15   Joycie Peek, PA-C    Family History Family History  Problem Relation Age of Onset  . Cancer Mother   . Hypertension Mother     Social History Social History  Substance Use Topics  . Smoking status: Current Some Day Smoker    Packs/day: 0.10    Types: Cigarettes  . Smokeless tobacco: Never Used  . Alcohol use No     Comment: occ     Allergies   Patient has no known allergies.   Review of Systems Review of Systems  HENT: Positive for trouble swallowing.   Gastrointestinal: Positive for nausea and vomiting.  All other systems reviewed and are negative.    Physical Exam Updated Vital Signs BP (!) 126/103 (BP Location: Left Arm)   Pulse 98   Temp (!) 97.4 F (36.3 C) (Oral)   Resp 18   Ht  (1.575 m)   Wt 44.9 kg (99 lb 1 oz)   SpO2 100%   BMI 18.12 kg/m   Physical Exam  Constitutional: He is oriented to person, place, and time. He appears well-developed and well-nourished. No distress.  HENT:  Head: Normocephalic and atraumatic.  Right Ear: Hearing normal.  Left Ear: Hearing normal.  Nose: Nose normal.  Mouth/Throat: Oropharynx is clear and moist and mucous  membranes are normal.  Eyes: Pupils are equal, round, and reactive to light. Conjunctivae and EOM are normal.  Neck: Normal range of motion. Neck supple.  Cardiovascular: Regular rhythm, S1 normal and S2 normal.  Exam reveals no gallop and no friction rub.   No murmur heard. Pulmonary/Chest: Effort normal and breath sounds normal. No respiratory distress. He exhibits no tenderness.  Abdominal: Soft. Normal appearance and bowel sounds are normal. There is no hepatosplenomegaly. There is no tenderness. There is no rebound, no guarding, no tenderness at McBurney's point and  negative Murphy's sign. No hernia.  Musculoskeletal: Normal range of motion.  Neurological: He is alert and oriented to person, place, and time. He has normal strength. No cranial nerve deficit or sensory deficit. Coordination normal. GCS eye subscore is 4. GCS verbal subscore is 5. GCS motor subscore is 6.  Skin: Skin is warm, dry and intact. Rash (Patchy slightly raised nontender nonpigmented rash on abdomen and arms) noted. No cyanosis.  Psychiatric: He has a normal mood and affect. His speech is normal and behavior is normal. Thought content normal.  Nursing note and vitals reviewed.    ED Treatments / Results  Labs (all labs ordered are listed, but only abnormal results are displayed) Labs Reviewed - No data to display  EKG  EKG Interpretation None       Radiology No results found.  Procedures Procedures (including critical care time)  Medications Ordered in ED Medications - No data to display   Initial Impression / Assessment and Plan / ED Course  I have reviewed the triage vital signs and the nursing notes.  Pertinent labs & imaging results that were available during my care of the patient were reviewed by me and considered in my medical decision making (see chart for details).     Patient with symptoms of mild dysphagia, likely secondary to reflux. Cannot rule out esophageal stricture, possibly hiatal hernia. Will refer to GI. Treat with proton pump inhibitor. Patient has recurrent rash on torso and arms consistent with eczema.  Final Clinical Impressions(s) / ED Diagnoses   Final diagnoses:  Gastroesophageal reflux disease, esophagitis presence not specified  Esophageal dysphagia  Eczema, unspecified type    New Prescriptions New Prescriptions   No medications on file     Gilda Crease, MD 07/01/17 [phone]

## 2017-07-08 MED ORDER — HYDROCORTISONE 2.5 % EX CREA: TOPICAL_CREAM | Freq: Two times a day (BID) | CUTANEOUS | 0 refills | Status: DC

## 2017-07-08 NOTE — Addendum Note (Signed)
Addended by: Earl LagosBLUM, Zaccai Chavarin S on: 07/08/2017 09:41 AM   Modules accepted: Orders

## 2017-07-22 ENCOUNTER — Ambulatory Visit (INDEPENDENT_AMBULATORY_CARE_PROVIDER_SITE_OTHER): Payer: Medicaid Other | Admitting: Internal Medicine

## 2017-07-22 VITALS — BP 117/67 | HR 103 | Temp 98.3°F | Ht 62.0 in | Wt 91.8 lb

## 2017-07-22 DIAGNOSIS — L909 Atrophic disorder of skin, unspecified: Secondary | ICD-10-CM

## 2017-07-22 DIAGNOSIS — K644 Residual hemorrhoidal skin tags: Secondary | ICD-10-CM | POA: Diagnosis not present

## 2017-07-22 DIAGNOSIS — B370 Candidal stomatitis: Secondary | ICD-10-CM

## 2017-07-22 DIAGNOSIS — L98411 Non-pressure chronic ulcer of buttock limited to breakdown of skin: Secondary | ICD-10-CM | POA: Diagnosis not present

## 2017-07-22 DIAGNOSIS — L03012 Cellulitis of left finger: Secondary | ICD-10-CM | POA: Diagnosis present

## 2017-07-22 DIAGNOSIS — E46 Unspecified protein-calorie malnutrition: Secondary | ICD-10-CM | POA: Diagnosis not present

## 2017-07-22 DIAGNOSIS — F989 Unspecified behavioral and emotional disorders with onset usually occurring in childhood and adolescence: Secondary | ICD-10-CM

## 2017-07-22 DIAGNOSIS — R1310 Dysphagia, unspecified: Secondary | ICD-10-CM | POA: Diagnosis not present

## 2017-07-22 DIAGNOSIS — R1110 Vomiting, unspecified: Secondary | ICD-10-CM | POA: Diagnosis not present

## 2017-07-22 DIAGNOSIS — Z681 Body mass index (BMI) 19 or less, adult: Secondary | ICD-10-CM | POA: Diagnosis not present

## 2017-07-22 DIAGNOSIS — M629 Disorder of muscle, unspecified: Secondary | ICD-10-CM | POA: Diagnosis not present

## 2017-07-22 DIAGNOSIS — R238 Other skin changes: Secondary | ICD-10-CM

## 2017-07-22 MED ORDER — HYDROCORTISONE 2.5 % EX CREA: TOPICAL_CREAM | Freq: Two times a day (BID) | CUTANEOUS | 0 refills | Status: DC

## 2017-07-22 MED ORDER — FLUCONAZOLE 200 MG PO TABS: ORAL_TABLET | ORAL | 0 refills | Status: DC

## 2017-07-22 MED FILL — FLUCONAZOLE 200 MG TABLET: 200 | 4 days supply | Qty: 2 | Fill #0

## 2017-07-22 MED FILL — HYDROCORTISONE 2.5% CREAM: 2.5 | 10 days supply | Qty: 30 | Fill #0

## 2017-07-22 NOTE — Assessment & Plan Note (Signed)
HPI: He has a new painful lesion on the medial side of his left 4th finger at the nailbed. He noticed it a couple days ago, less than one week. This started draining spontaneously and he now wears a band-aid over it with no pain at rest. His mother thinks this is due to his nail biting which is a chronic behavior of his. He has never had a significant finger infection in the past.  A: Acute paronychia of the left 4th digit secondary to chronic nail biting It is already draining spontaneously without an obvious abscess in need of drainage. Avoiding further injury should improve on its own without an obvious need for antibiotics at this time.  P: Continue to keep it under band-aid and avoid biting If swelling and erythema worsen significantly instructed to call back for antibiotic prescription.

## 2017-07-22 NOTE — Progress Notes (Signed)
CC: Paronychia on left 4th digit  HPI:  [name] is a 26 y.o. male with PMHx detailed below presenting with new draining infection on his left 4th finger. He also has more symptoms of painful external hemorrhoids without bleeding for the past week.  See problem based assessment and plan below for additional details.  Paronychia of finger of left hand HPI: He has a new painful lesion on the medial side of his left 4th finger at the nailbed. He noticed it a couple days ago, less than one week. This started draining spontaneously and he now wears a band-aid over it with no pain at rest. His mother thinks this is due to his nail biting which is a chronic behavior of his. He has never had a significant finger infection in the past.  A: Acute paronychia of the left 4th digit secondary to chronic nail biting It is already draining spontaneously without an obvious abscess in need of drainage. Avoiding further injury should improve on its own without an obvious need for antibiotics at this time.  P: Continue to keep it under band-aid and avoid biting If swelling and erythema worsen significantly instructed to call back for antibiotic prescription.  Thrush, oral HPI: He was prescribed nystatin but has not started treatment yet. He denies pain at rest or odynophagia and says it comes off easily when brushing his teeth. The onset of this is unclear. He has a history of recent skin rash but that is improving and does not follow a candidiasis pattern.  A: He has oral thrush which is potentially new. I also suspect esophageal candidiasis which may be contributing to his dysphagia and weight loss. He is already referred to GI for evaluation.  P: Oral fluconazole 200mg  once then again in 3 days Recommended he follow up with GI, possibly endoscopy if felt appropriate  Protein-calorie malnutrition (HCC) HPI: He is unintentionally losing weight now with a BMI < 17. His weight was  documented at 105lbs in July and is now 91lbs. He was recently seen in the ED and felt to have possibly severe gastroesophageal reflux. He denies odynophagia and describes more of vomiting, early satiety, and difficulty swallowing.  A: Unintentional weight loss and decreased muscle bulk. He also has several areas of skin breakdown in his intergluteal cleft he is self treating with vaseline. It is possible this is secondary to a GI process but also worrisome for HIV, hyperthyroidism, metabolic derangements so will check labs today.  P: Treating for oral candidiasis as above Follow up with GI is already arranged Checking Bmet, CBC, TSH, HIV  External hemorrhoid HPI: Longstanding hemorrhoids with increased pain in the past week. He denies any bright red blood with bowel movements. He previously received a cream that helped but is not using this at the moment. He is not having any constipation or straining with bowel movements.  A: External hemorrhoids without concerning features on exam.  P: Topical hydrocortisone cream BID PRN rectally for hemorroids    No past medical history on file.  Review of Systems: Review of Systems  Constitutional: Negative for fever.  Eyes: Negative for blurred vision.  Respiratory: Negative for cough.   Cardiovascular: Negative for chest pain.  Gastrointestinal: Positive for vomiting. Negative for abdominal pain, blood in stool, constipation and diarrhea.  Genitourinary: Negative for dysuria.  Musculoskeletal: Negative for falls.  Skin: Positive for rash.     Physical Exam: Vitals:   07/22/17 0932  BP: 117/67  Pulse: (!) 103  Temp: 98.3 F (36.8 C)  TempSrc: Oral  SpO2: 100%  Weight: 91 lb 12.8 oz (41.6 kg)  Height: 5\' 2"  (1.575 m)   GENERAL- Very thin man in no acute distress HEENT- Atraumatic, extensive thrush on tongue and posterior oropharynx CARDIAC- RRR, no murmurs, rubs or gallops. RESP- CTAB, no wheezes or crackles. ABDOMEN- Soft,  nontender, no guarding or rebound, normoactive bowel sounds present RECTAL- External hemorrhoids minimally tender to direct palpation, DRE grossly normal with no blood in rectal vault EXTREMITIES- Low muscle bulk, without obvious deformity. SKIN- Clean pink skin wounds in pressure areas in the intergluteal cleft without surrounding inflammation. PSYCH- Affect somewhat flat and poor eye contact, speaks slowly but responds appropriately    Assessment & Plan:   See encounters tab for problem based medical decision making.   Patient discussed with Dr. Oswaldo DoneVincent

## 2017-07-22 NOTE — Assessment & Plan Note (Addendum)
HPI: He was prescribed nystatin but has not started treatment yet. He denies pain at rest or odynophagia and says it comes off easily when brushing his teeth. The onset of this is unclear. He has a history of recent skin rash but that is improving and does not follow a candidiasis pattern.  A: He has oral thrush which is potentially new. I also suspect esophageal candidiasis which may be contributing to his dysphagia and weight loss. He is already referred to GI for evaluation.  P: Oral fluconazole 200mg  once then again in 3 days Recommended he follow up with GI, possibly endoscopy if felt appropriate

## 2017-07-22 NOTE — Assessment & Plan Note (Addendum)
HPI: Longstanding hemorrhoids with increased pain in the past week. He denies any bright red blood with bowel movements. He previously received a cream that helped but is not using this at the moment. He is not having any constipation or straining with bowel movements.  A: External hemorrhoids without concerning features on exam.  P: Topical hydrocortisone cream BID PRN rectally for hemorroids

## 2017-07-22 NOTE — Assessment & Plan Note (Addendum)
HPI: He is unintentionally losing weight now with a BMI < 17. His weight was documented at 105lbs in July and is now 91lbs. He was recently seen in the ED and felt to have possibly severe gastroesophageal reflux. He denies odynophagia and describes more of vomiting, early satiety, and difficulty swallowing.  A: Unintentional weight loss and decreased muscle bulk. He also has several areas of skin breakdown in his intergluteal cleft he is self treating with vaseline. It is possible this is secondary to a GI process but also worrisome for HIV, hyperthyroidism, metabolic derangements so will check labs today.  P: Treating for oral candidiasis as above Follow up with GI is already arranged Checking Bmet, CBC, TSH, HIV

## 2017-07-22 NOTE — Patient Instructions (Addendum)
It was a pleasure to see you today Aaron Lee.  You have a skin infection at the left finger. This should improve on its own if left alone. Please call us if it gets much larger or more red as they can rarely progress and need antibiotics.  For your hemorrhoids I recommend using a topical steroid cream for them when painful.  Please continue to use a barrier cream either like vaseline or a zinc-oxide containing cream such as baby diaper cream. This will help your skin to stay protected.

## 2017-07-23 LAB — BMP8+ANION GAP
Anion Gap: 16 mmol/L (ref 10.0–18.0)
BUN / CREAT RATIO: 10 (ref 9–20)
BUN: 10 mg/dL (ref 6–20)
CALCIUM: 8.4 mg/dL — AB (ref 8.7–10.2)
CO2: 25 mmol/L (ref 20–29)
CREATININE: 0.96 mg/dL (ref 0.76–1.27)
Chloride: 99 mmol/L (ref 96–106)
GFR, EST AFRICAN AMERICAN: 125 mL/min/{1.73_m2} (ref >59)
GFR, EST NON AFRICAN AMERICAN: 108 mL/min/{1.73_m2} (ref >59)
GLUCOSE: 100 mg/dL — AB (ref 65–99)
Potassium: 3.7 mmol/L (ref 3.5–5.2)
SODIUM: 140 mmol/L (ref 134–144)

## 2017-07-23 LAB — HIV 1/2 AB DIFFERENTIATION
HIV 1 Ab: POSITIVE — AB
HIV 2 Ab: NEGATIVE
NOTE (HIV CONF MULTIP: POSITIVE

## 2017-07-23 LAB — CBC
Hematocrit: 31.8 % — ABNORMAL LOW (ref 37.5–51.0)
Hemoglobin: 9.8 g/dL — ABNORMAL LOW (ref 13.0–17.7)
MCH: 25.3 pg — AB (ref 26.6–33.0)
MCHC: 30.8 g/dL — AB (ref 31.5–35.7)
MCV: 82 fL (ref 79–97)
PLATELETS: 198 10*3/uL (ref 150–379)
RBC: 3.87 x10E6/uL — ABNORMAL LOW (ref 4.14–5.80)
RDW: 14.9 % (ref 12.3–15.4)
WBC: 4.1 10*3/uL (ref 3.4–10.8)

## 2017-07-23 LAB — HIV ANTIBODY (ROUTINE TESTING W REFLEX): HIV Screen 4th Generation wRfx: REACTIVE — AB

## 2017-07-23 LAB — TSH: TSH: 2.44 u[IU]/mL (ref 0.450–4.500)

## 2017-07-23 NOTE — Progress Notes (Signed)
Internal Medicine Clinic Attending  I saw and evaluated the patient.  I personally confirmed the key portions of the history and exam documented by Dr. Rice and I reviewed pertinent patient test results.  The assessment, diagnosis, and plan were formulated together and I agree with the documentation in the resident's note.  

## 2017-07-26 ENCOUNTER — Ambulatory Visit: Payer: Medicaid Other | Admitting: Internal Medicine

## 2017-07-26 ENCOUNTER — Other Ambulatory Visit: Payer: Self-pay | Admitting: Internal Medicine

## 2017-07-26 ENCOUNTER — Encounter: Payer: Self-pay | Admitting: Internal Medicine

## 2017-07-26 VITALS — BP 121/60 | HR 128 | Temp 100.1°F | Ht 63.0 in | Wt 96.9 lb

## 2017-07-26 DIAGNOSIS — Z681 Body mass index (BMI) 19 or less, adult: Secondary | ICD-10-CM

## 2017-07-26 DIAGNOSIS — R634 Abnormal weight loss: Secondary | ICD-10-CM

## 2017-07-26 DIAGNOSIS — R000 Tachycardia, unspecified: Secondary | ICD-10-CM

## 2017-07-26 DIAGNOSIS — Z21 Asymptomatic human immunodeficiency virus [HIV] infection status: Secondary | ICD-10-CM

## 2017-07-26 DIAGNOSIS — L03012 Cellulitis of left finger: Secondary | ICD-10-CM | POA: Diagnosis not present

## 2017-07-26 DIAGNOSIS — R1310 Dysphagia, unspecified: Secondary | ICD-10-CM

## 2017-07-26 DIAGNOSIS — B370 Candidal stomatitis: Secondary | ICD-10-CM | POA: Diagnosis not present

## 2017-07-26 DIAGNOSIS — F988 Other specified behavioral and emotional disorders with onset usually occurring in childhood and adolescence: Secondary | ICD-10-CM

## 2017-07-26 DIAGNOSIS — B20 Human immunodeficiency virus [HIV] disease: Secondary | ICD-10-CM | POA: Insufficient documentation

## 2017-07-26 MED ORDER — SULFAMETHOXAZOLE-TRIMETHOPRIM 800-160 MG PO TABS: 1.0000 | ORAL_TABLET | Freq: Two times a day (BID) | ORAL | 0 refills | Status: DC

## 2017-07-26 MED FILL — SULFAMETHOXAZOLE/TMP DS TAB: 800-160 | 5 days supply | Qty: 10 | Fill #0

## 2017-07-26 NOTE — Progress Notes (Signed)
CC: Follow up for positive HIV test  HPI:  [name] is a 26 y.o. male with PMHx detailed below presenting for repeat visit to discuss recent positive HIV screening test last week.  See problem based assessment and plan below for additional details.  HIV-1 (human immunodeficiency virus I) (HCC) HPI: He is here today in follow up for positive HIV-1 Ab test last week. He was unaware of the diagnosis until being informed this morning. He has had previous male and one male partner most recently about 3 years ago but is not sexually active at this time. He denies any history of intravenous drug use and has not required blood transfusions in the past. He was initially very distressed and anxious about this diagnosis but feels more reassured after counseling today with a plan for treatment going forwards. A: New diagnosis of HIV. I suspect fairly advanced disease given the clinical picture of thrush, more severe than expected skin infections, and his degree of weight loss. This could certainly fit with a sexually transmitted infection from years prior. He will need timely referral to infectious disease clinic for workup and treatment. P: Counseled on safe behaviors, recommended avoiding sexual and blood contact completely at least until on treatment CD4 count and % today HIV viral load Referral to infectious disease clinic  Paronychia of finger of left hand HPI: His finger remains very painful. It continues to drain spontaneously but he has not seen improvement in swelling or pain. He denies feeling febrile but has a temperature of 100.49F in clinic today. A: Acute paronychia of the left 4th digit secondary to chronic nail biting. I see no need for incision and drainage today as there is no large pus collection. However he has an immunocompromising condition and low grade fever so will treat with oral antibiotics as the risk for harm is greater than average. Common purulent skin  pathogen would be staph although oral flora is possible considering nailbiting history. P: Prescribed oral Bactrim DS BID x5 days CBC and blood culture today Recommended follow up visit in 1 week if symptoms are not resolving  Thrush, oral His thrush is much improved and weight is increasing from last week likely from better hydration. Candidal esophagitis is the most likely explanation in this clinical picture. He is scheduled for endoscopy to evaluate his dysphagia and weight loss which I think is reasonable to complete considering his high risk for malnutrition.   History reviewed. No pertinent past medical history.  Review of Systems: Review of Systems  Constitutional: Negative for chills, diaphoresis and fever.  Respiratory: Negative for shortness of breath.   Cardiovascular: Negative for chest pain.  Gastrointestinal: Negative for blood in stool, constipation and diarrhea.  Musculoskeletal:       Finger pain  Skin: Positive for itching and rash.  Neurological: Negative for sensory change.     Physical Exam: Vitals:   07/26/17 1047  BP: 121/60  Pulse: (!) 128  Temp: 100.1 F (37.8 C)  TempSrc: Oral  SpO2: 100%  Weight: 96 lb 14.4 oz (44 kg)  Height: 5\' 3"  (1.6 m)   GENERAL- Thin young man in no acute distress HEENT- Oropharynx appears clear and moist CARDIAC- Tachycardic, no murmurs, rubs or gallops. RESP- CTAB, no wheezes or crackles. ABDOMEN- Soft, nontender, no guarding or rebound NEURO- Sensation intact globally EXTREMITIES- Left 4th finger erythematous, with open small amount of purulent drainage, very painful to touch SKIN- Faint red rash on lateral chest and axilla, patchy skin  erosions on intergluteal cleft PSYCH- Slow responses, very anxious today about news, calm after discussion and counseling   Assessment & Plan:   See encounters tab for problem based medical decision making.   Patient discussed with Dr. Criselda Peaches

## 2017-07-26 NOTE — Patient Instructions (Signed)
It was a pleasure to see you today Aaron Lee.  I would like you to start taking an oral antibiotic twice daily for 5 days for your finger infection. If this continues to hurt severely after a week we will need to see you again.  We are checking blood tests today to get a better idea about your health. We will also place a referral to the infectious disease clinic to arrange an appointment and get you started on medication to feel better.  Your thrush seems to be getting better. Hopefully your swallowing will continue to improve.  The rash on your chest and back seems to be atopic dermatitis (eczema). You can take antihistamines like zyrtec or claritin or use topical moisturizing agents are often beneficial for this type of rash.

## 2017-07-27 LAB — T-HELPER CELLS (CD4) COUNT (NOT AT ARMC)
CD4 % Helper T Cell: 6 % — ABNORMAL LOW (ref 33–55)
CD4 T Cell Abs: 20 /uL — ABNORMAL LOW (ref 400–2700)

## 2017-07-27 LAB — CBC
HEMOGLOBIN: 8.5 g/dL — AB (ref 13.0–17.7)
Hematocrit: 27.5 % — ABNORMAL LOW (ref 37.5–51.0)
MCH: 25.4 pg — ABNORMAL LOW (ref 26.6–33.0)
MCHC: 30.9 g/dL — ABNORMAL LOW (ref 31.5–35.7)
MCV: 82 fL (ref 79–97)
PLATELETS: 201 10*3/uL (ref 150–379)
RBC: 3.35 x10E6/uL — AB (ref 4.14–5.80)
RDW: 15.2 % (ref 12.3–15.4)
WBC: 5.2 10*3/uL (ref 3.4–10.8)

## 2017-07-27 NOTE — Assessment & Plan Note (Signed)
HPI: He is here today in follow up for positive HIV-1 Ab test last week. He was unaware of the diagnosis until being informed this morning. He has had previous male and one male partner most recently about 3 years ago but is not sexually active at this time. He denies any history of intravenous drug use and has not required blood transfusions in the past. He was initially very distressed and anxious about this diagnosis but feels more reassured after counseling today with a plan for treatment going forwards. A: New diagnosis of HIV. I suspect fairly advanced disease given the clinical picture of thrush, more severe than expected skin infections, and his degree of weight loss. This could certainly fit with a sexually transmitted infection from years prior. He will need timely referral to infectious disease clinic for workup and treatment. P: Counseled on safe behaviors, recommended avoiding sexual and blood contact completely at least until on treatment CD4 count and % today HIV viral load Referral to infectious disease clinic

## 2017-07-27 NOTE — Assessment & Plan Note (Signed)
HPI: His finger remains very painful. It continues to drain spontaneously but he has not seen improvement in swelling or pain. He denies feeling febrile but has a temperature of 100.45F in clinic today. A: Acute paronychia of the left 4th digit secondary to chronic nail biting. I see no need for incision and drainage today as there is no large pus collection. However he has an immunocompromising condition and low grade fever so will treat with oral antibiotics as the risk for harm is greater than average. Common purulent skin pathogen would be staph although oral flora is possible considering nailbiting history. P: Prescribed oral Bactrim DS BID x5 days CBC and blood culture today Recommended follow up visit in 1 week if symptoms are not resolving

## 2017-07-27 NOTE — Assessment & Plan Note (Signed)
His thrush is much improved and weight is increasing from last week likely from better hydration. Candidal esophagitis is the most likely explanation in this clinical picture. He is scheduled for endoscopy to evaluate his dysphagia and weight loss which I think is reasonable to complete considering his high risk for malnutrition.

## 2017-07-28 ENCOUNTER — Telehealth: Payer: Self-pay

## 2017-07-28 NOTE — Telephone Encounter (Signed)
-----   Message from Veryl SpeakGregory D Calone, FNP sent at 07/27/2017  4:50 PM EST ----- Marolyn Hammockammy and Courtney,  Dr. Dimple Caseyice saw patient in IM clinic and patient had a CD4 count of 20 is new B20.  Can we please get him in as soon as we can?  Thanks!  Tammy SoursGreg

## 2017-07-28 NOTE — Telephone Encounter (Signed)
Patient was referred by Internal Medicine for newly diagnosed HIV.  Aaron EkeGreg Calone, NP has requested an appointment for patient.  I spoke with patient's Mother and she will need a same day appointment.   No labs done prior to initial visit other than those done by Primary physician.  The Mother states he is very sick and would rather not wait 2 weeks .  Appointment given for next week.   Aaron Josephsammy K King, RN

## 2017-07-28 NOTE — Progress Notes (Signed)
Internal Medicine Clinic Attending  I saw and evaluated the patient.  I personally confirmed the key portions of the history and exam documented by Dr. Rice and I reviewed pertinent patient test results.  The assessment, diagnosis, and plan were formulated together and I agree with the documentation in the resident's note.  

## 2017-08-02 ENCOUNTER — Ambulatory Visit (INDEPENDENT_AMBULATORY_CARE_PROVIDER_SITE_OTHER): Payer: Medicaid Other | Admitting: Infectious Diseases

## 2017-08-02 ENCOUNTER — Emergency Department (HOSPITAL_COMMUNITY)
Admission: EM | Admit: 2017-08-02 | Discharge: 2017-08-03 | Disposition: A | Discharge status: Home / Self Care | Payer: Medicaid Other | Attending: Emergency Medicine | Admitting: Emergency Medicine

## 2017-08-02 ENCOUNTER — Other Ambulatory Visit (HOSPITAL_COMMUNITY)
Admission: RE | Admit: 2017-08-02 | Discharge: 2017-08-02 | Disposition: A | Discharge status: Home / Self Care | Payer: Medicaid Other | Source: Ambulatory Visit | Attending: Infectious Diseases | Admitting: Infectious Diseases

## 2017-08-02 ENCOUNTER — Encounter: Payer: Self-pay | Admitting: Infectious Diseases

## 2017-08-02 ENCOUNTER — Encounter: Payer: Medicaid Other | Admitting: Licensed Clinical Social Worker

## 2017-08-02 VITALS — BP 106/71 | HR 120 | Temp 97.0°F | Wt 96.1 lb

## 2017-08-02 DIAGNOSIS — L03012 Cellulitis of left finger: Secondary | ICD-10-CM | POA: Diagnosis not present

## 2017-08-02 DIAGNOSIS — D649 Anemia, unspecified: Secondary | ICD-10-CM | POA: Insufficient documentation

## 2017-08-02 DIAGNOSIS — B20 Human immunodeficiency virus [HIV] disease: Secondary | ICD-10-CM

## 2017-08-02 DIAGNOSIS — Z79899 Other long term (current) drug therapy: Secondary | ICD-10-CM | POA: Diagnosis not present

## 2017-08-02 DIAGNOSIS — K6289 Other specified diseases of anus and rectum: Secondary | ICD-10-CM | POA: Insufficient documentation

## 2017-08-02 DIAGNOSIS — Z0000 Encounter for general adult medical examination without abnormal findings: Secondary | ICD-10-CM | POA: Insufficient documentation

## 2017-08-02 DIAGNOSIS — D638 Anemia in other chronic diseases classified elsewhere: Secondary | ICD-10-CM

## 2017-08-02 HISTORY — DX: Human immunodeficiency virus (HIV) disease: B20

## 2017-08-02 HISTORY — DX: Asymptomatic human immunodeficiency virus (hiv) infection status: Z21

## 2017-08-02 LAB — CULTURE, BLOOD (SINGLE)

## 2017-08-02 MED ORDER — AMOXICILLIN-POT CLAVULANATE 875-125 MG PO TABS: 1.0000 | ORAL_TABLET | Freq: Two times a day (BID) | ORAL | 0 refills | Status: AC

## 2017-08-02 MED ORDER — BICTEGRAVIR-EMTRICITAB-TENOFOV 50-200-25 MG PO TABS: 1.0000 | ORAL_TABLET | Freq: Every day | ORAL | 11 refills | Status: DC

## 2017-08-02 MED ORDER — SULFAMETHOXAZOLE-TRIMETHOPRIM 800-160 MG PO TABS: 1.0000 | ORAL_TABLET | Freq: Every day | ORAL | 2 refills | Status: DC

## 2017-08-02 MED FILL — BIKTARVY 50-200-25 MG TABS: 50-200-25 | 30 days supply | Qty: 30 | Fill #0

## 2017-08-02 MED FILL — SULFAMETHOXAZOLE/TMP DS TAB: 800-160 | 30 days supply | Qty: 30 | Fill #0

## 2017-08-02 MED FILL — AMOX-CLAV 875-125 MG TABLET: 875-125 | 10 days supply | Qty: 20 | Fill #0

## 2017-08-02 NOTE — ED Triage Notes (Signed)
Pt has HIV and has skin breakdown on his bottom.  Pt states he has hemorrhoids but none noted on initial exam. Pt states he cannot sleep due to the pain.  Has several circular areas of skin breakdown.

## 2017-08-02 NOTE — Progress Notes (Signed)
Patient Active Problem List   Diagnosis Date Noted  . Anemia 08/02/2017  . Healthcare maintenance 08/02/2017  . AIDS (acquired immune deficiency syndrome) ([place]) 07/26/2017  . External hemorrhoid 07/22/2017  . Protein-calorie malnutrition ([place]) 07/22/2017  . Skin breakdown 07/22/2017  . Paronychia of finger of left hand 07/22/2017  . Thrush, oral 07/22/2017  . Rash 03/31/2017  . Seasonal allergic rhinitis 03/31/2017      Medication List        Accurate as of 08/02/17  3:30 PM. Always use your most recent med list.          amoxicillin-clavulanate 875-125 MG tablet Commonly known as:  AUGMENTIN Take 1 tablet 2 (two) times daily for 10 days by mouth.   bictegravir-emtricitabine-tenofovir AF 50-200-25 MG Tabs tablet Commonly known as:  BIKTARVY Take 1 tablet daily by mouth.   hydrocortisone 2.5 % cream Apply topically 2 (two) times daily. To external hemorrhoids if painful   pantoprazole 20 MG tablet Commonly known as:  PROTONIX Take 1 tablet (20 mg total) by mouth daily.   sulfamethoxazole-trimethoprim 800-160 MG tablet Commonly known as:  BACTRIM DS,SEPTRA DS Take 1 tablet daily by mouth.   triamcinolone 0.1 % cream : eucerin Crea Apply 1 application topically 2 (two) times daily.       Where to Get Your Medications    These medications were sent to [facility] [place].  [address] [place] [address]   Phone:  [phone]   amoxicillin-clavulanate 875-125 MG tablet  bictegravir-emtricitabine-tenofovir AF 50-200-25 MG Tabs tablet  sulfamethoxazole-trimethoprim 800-160 MG tablet     Subjective: Aaron Lee is here today for his first visit for HIV care.   HIV = This is a new diagnosis for Aaron Lee. He is here today with the support of his mother. He was informed at a primary care visit that he has HIV after being found to have several skin non-healing skin infections and  severe oral thrush. He has since been on Diflucan for this with improvmeent. Preliminary labs at office show a VL of 376,000 and CD4 count of 20. Endorses no complaints today suggestive of associated opportunistic infection or advancing HIV disease such as fevers, night sweats, weight loss, anorexia, cough, SOB, nausea, vomiting, diarrhea, headache, sensory changes, lymphadenopathy.   High Risk Behaviors: MSM and Heterosexual contact without condom use    Associated symptoms of his HIV: oral thrush, non-healing SSTIs  Past STI history: none known   Adjustment Disorder = feels very overwhelmed and anxious with diagnosis and fearful he will die or something bad will happen to him. Has felt down and sad over the last few days with learning diagnosis but not hopeless.   Depression screen [facility] 2/9 07/26/2017 07/22/2017 06/29/2017 03/30/2017  Decreased Interest 0 0 0 0  Down, Depressed, Hopeless 0 0 0 0  PHQ - 2 Score 0 0 0 0    Finger Pain = has had trouble with severe pain to the left #4 distal finger at the nailbed. Was Rx'd Bactrim but has not helped much. Spontaneously draining and with some bleeding today. He has put his finger in his mouth several times during visit and when removing his bandaid.   Health Maintenance = has not had flu shot yet. Does not work and lives with his sister currently.   Review of Systems: Review of Systems  Constitutional: Negative for chills, fever, malaise/fatigue and weight loss.  HENT: Negative  for sore throat.   Eyes: Negative for double vision.  Respiratory: Negative for cough, sputum production and shortness of breath.   Cardiovascular: Negative.   Gastrointestinal: Negative for abdominal pain, diarrhea and vomiting.  Genitourinary: Negative.   Musculoskeletal: Negative for joint pain, myalgias and neck pain.  Skin: Negative for rash.       Non-healing wound to left #4 finger nailbed.   Neurological: Negative for headaches.  Psychiatric/Behavioral:  Positive for depression. Negative for substance abuse. The patient is not nervous/anxious.     No past medical history on file.  Social History   Tobacco Use  . Smoking status: Not on file  . Smokeless tobacco: Never Used  Substance Use Topics  . Alcohol use: No    Comment: occ  . Drug use: Yes    Types: Marijuana    Family History  Problem Relation Age of Onset  . Cancer Mother   . Hypertension Mother     No Known Allergies    Objective: Physical Exam  Constitutional: He is oriented to person, place, and time and well-developed, well-nourished, and in no distress.  Thin appearing AA male. Easily distracted with his phone. Mother accompanies him today.   HENT:  Mouth/Throat: No oral lesions. Normal dentition. No dental caries. Oropharyngeal exudate (Slight coating over tongue from thrush) present.  Eyes: No scleral icterus.  Cardiovascular: Regular rhythm and normal heart sounds. Tachycardia present.  Pulmonary/Chest: Effort normal and breath sounds normal.  Abdominal: Soft. He exhibits no distension. There is no tenderness.  Genitourinary:  Genitourinary Comments: Patient declined exam of rectum for hemorrhoids   Musculoskeletal: Normal range of motion. He exhibits no edema.  Lymphadenopathy:    He has no cervical adenopathy.    He has no axillary adenopathy.  Neurological: He is alert and oriented to person, place, and time.  Skin: Skin is warm and dry. No rash noted.  Psychiatric: Mood and affect normal.  Left #4 Finger: ulceration surrounding medial nailbed with some bleeding and some purulence noted. Spontaneously draining. Surrounding swelling/redness up to mid knuckle. Very tender to the touch.    Vitals:   08/02/17 1011  BP: 106/71  Pulse: (!) 120  Temp: (!) 97 F (36.1 C)    Lab Results Lab Results  Component Value Date   WBC 5.2 07/26/2017   HGB 8.5 (L) 07/26/2017   HCT 27.5 (L) 07/26/2017   MCV 82 07/26/2017   PLT 201 07/26/2017    Lab  Results  Component Value Date   CREATININE 0.96 07/22/2017   BUN 10 07/22/2017   NA 140 07/22/2017   K 3.7 07/22/2017   CL 99 07/22/2017   CO2 25 07/22/2017   No results found for: ALT, AST, GGT, ALKPHOS, BILITOT  No results found for: CHOL, HDL, LDLCALC, LDLDIRECT, TRIG, CHOLHDL CD4 T Cell Abs (/uL)  Date Value  07/26/2017 20 (L)   No results found for: HIV1GENOSEQ No results found for: HAV No results found for: HEPBSAG, HEPBSAB No results found for: HCVAB No results found for: CHLAMYDIAWP, N No results found for: GCPROBEAPT No results found for: QUANTGOLD  I have reviewed all available documents of his medical record.    Assessment and Plan:  Problem List Items Addressed This Visit      Musculoskeletal and Integument   Paronychia of finger of left hand    Now with evidence of surrounding cellulitis of finger. Will try Rx for Augmentin x 10 days to see if this improves.  Relevant Medications   bictegravir-emtricitabine-tenofovir AF (BIKTARVY) 50-200-25 MG TABS tablet   sulfamethoxazole-trimethoprim (BACTRIM DS,SEPTRA DS) 800-160 MG tablet     Other   AIDS (acquired immune deficiency syndrome) ([place]) - Primary    New patient here to establish for HIV care. I discussed with Orlena Sheldon treatment options/side effects, benefits of treatment and long-term outcomes. I discussed how HIV is transmitted and the process of untreated HIV including increased risk for opportunistic infections, cancer, dementia and renal failure. he was counseled on routine HIV care including medication adherence, blood monitoring, necessary vaccines and follow up visits. Counseled regarding safe sex practices including: condom use, partner disclosure, limiting partners and potential for PrEP. he spent time talking with our pharmacist Raquel Sarna regarding successful practices of ART and understands to reach out to our clinic in the future with questions.   I decided to start him on Biktarvy today as  he is new to daily medication regimen and I think would do well on simplest of regimens possible. He has Medicaid so will start on meds right away. Counseling about side effects and goal with medications provided. Will have him return in 3-4 weeks to recheck VL/CD4 and assess adherence.   Will start Bactrim DS daily for OI prophylaxis. Will hold off on MAC proph as he will start on medications right away.        Relevant Medications   amoxicillin-clavulanate (AUGMENTIN) 875-125 MG tablet   bictegravir-emtricitabine-tenofovir AF (BIKTARVY) 50-200-25 MG TABS tablet   sulfamethoxazole-trimethoprim (BACTRIM DS,SEPTRA DS) 800-160 MG tablet   Other Relevant Orders   HIV-1 RNA ultraquant reflex to gentyp+   RPR   Urine cytology ancillary only   Cytology (oral, anal, urethral) ancillary only   COMPLETE METABOLIC PANEL WITH GFR   CBC with Differential/Platelet   HLA B*5701   Hepatitis A antibody, total   Hepatitis B surface antibody   Hepatitis B surface antigen   Hepatitis C antibody   Hepatitis B Core Antibody, total   QuantiFERON-TB Gold Plus   Urinalysis   T-helper cell (CD4)- ([facility] clinic only)   Anemia    Likely related to HIV/AIDS. Will continue to monitor but I would expect this to improve as he takes ART.       Healthcare maintenance    Will check Hep B/A immunity today. Hold off on vaccines until improvement in CD4 count.         I spent greater than 45 minutes with the patient today. Greater than 50% of the time spent face-to-face counseling and coordination of care re: HIV and health maintenance.   Janene Madeira, MSN, NP-C [facility] [facility]  08/02/17 3:30 PM

## 2017-08-02 NOTE — Patient Instructions (Signed)
Please start taking Biktarvy once a day - this is for your HIV. Take with or without food.   Please start taking Bactrim one pill one a day - this is to protect your immune system while it is low for now. Continue this until we tell you to stop.   Please start taking Augmentin one tablet twice a day for your finger. Please stop picking it and biting it to keep it clean and dry/wrapped up.   Please come back in 4 weeks.   Please sign up with MyChart to access your labs and set up email communication with our clinic for non-urgent medical concerns.

## 2017-08-02 NOTE — Assessment & Plan Note (Signed)
Likely related to HIV/AIDS. Will continue to monitor but I would expect this to improve as he takes ART.

## 2017-08-02 NOTE — Assessment & Plan Note (Signed)
New patient here to establish for HIV care. I discussed with Aaron Lee treatment options/side effects, benefits of treatment and long-term outcomes. I discussed how HIV is transmitted and the process of untreated HIV including increased risk for opportunistic infections, cancer, dementia and renal failure. he was counseled on routine HIV care including medication adherence, blood monitoring, necessary vaccines and follow up visits. Counseled regarding safe sex practices including: condom use, partner disclosure, limiting partners and potential for PrEP. he spent time talking with our pharmacist Raquel Sarna regarding successful practices of ART and understands to reach out to our clinic in the future with questions.   I decided to start him on Biktarvy today as he is new to daily medication regimen and I think would do well on simplest of regimens possible. He has Medicaid so will start on meds right away. Counseling about side effects and goal with medications provided. Will have him return in 3-4 weeks to recheck VL/CD4 and assess adherence.   Will start Bactrim DS daily for OI prophylaxis. Will hold off on MAC proph as he will start on medications right away.

## 2017-08-02 NOTE — Progress Notes (Signed)
HPI: Aaron Lee is a 26 y.o. male who presents to the clinic today for a new HIV diagnosis.   Allergies: No Known Allergies  Vitals: Temp: 97 F (36.1 C) (11/12 1011) Temp Source: Oral (11/12 1011) BP: 106/71 (11/12 1011) Pulse Rate: 120 (11/12 1011)  Past Medical History: No past medical history on file.  Social History: Social History   Socioeconomic History  . Marital status: Single    Spouse name: Not on file  . Number of children: Not on file  . Years of education: Not on file  . Highest education level: Not on file  Social Needs  . Financial resource strain: Not on file  . Food insecurity - worry: Not on file  . Food insecurity - inability: Not on file  . Transportation needs - medical: Not on file  . Transportation needs - non-medical: Not on file  Occupational History  . Not on file  Tobacco Use  . Smoking status: Not on file  . Smokeless tobacco: Never Used  Substance and Sexual Activity  . Alcohol use: No    Comment: occ  . Drug use: Yes    Types: Marijuana  . Sexual activity: Not on file  Other Topics Concern  . Not on file  Social History Narrative  . Not on file     Labs: CD4 T Cell Abs (/uL)  Date Value  07/26/2017 20 (L)    CrCl: Estimated Creatinine Clearance: 71.3 mL/min (by C-G formula based on SCr of 0.96 mg/dL).  Lipids: No results found for: CHOL, TRIG, HDL, CHOLHDL, VLDL, LDLCALC  Assessment: Aaron Lee is here today to start therapy for his new HIV diagnosis. He will start on Biktarvy today. Talked to him about the importance of taking his medication at the same time every. I told him that the medication can be taken with or without food and that the main thing is to take the medication at a time that he will remember it every day whether it be breakfast or bedtime. I also provided him with a daily pillbox so that he can keep track as to whether or not he has taken any doses.   Reviewed adverse effects including headache and  explained that they should go away after being on the medication for a couple of weeks if they occur at all. Recommended tylenol if headache does occur. Also provided him with Cassie Kuppleweiser's business card in case he has any trouble with the medication. He will follow-up with us in 2 weeks to check adherence and tolerability.  Recommendations: - Take Biktarvy once a day - F/U with pharmacy clinic on 11/26 at 10:30 AM - F/U with [name] in 1 month  Della GooEmily S Hobson Lax, PharmD PGY2 Infectious Diseases Pharmacy Resident  [facility] 08/02/2017, 11:20 AM

## 2017-08-02 NOTE — Assessment & Plan Note (Signed)
Now with evidence of surrounding cellulitis of finger. Will try Rx for Augmentin x 10 days to see if this improves.

## 2017-08-02 NOTE — Assessment & Plan Note (Signed)
Will check Hep B/A immunity today. Hold off on vaccines until improvement in CD4 count.

## 2017-08-03 ENCOUNTER — Encounter (HOSPITAL_COMMUNITY): Payer: Self-pay | Admitting: Emergency Medicine

## 2017-08-03 ENCOUNTER — Other Ambulatory Visit: Payer: Self-pay

## 2017-08-03 ENCOUNTER — Telehealth: Payer: Self-pay | Admitting: Licensed Clinical Social Worker

## 2017-08-03 LAB — COMPLETE METABOLIC PANEL WITH GFR
AG RATIO: 0.9 (calc) — AB (ref 1.0–2.5)
ALBUMIN MSPROF: 3.2 g/dL — AB (ref 3.6–5.1)
ALT: 9 U/L (ref 9–46)
AST: 15 U/L (ref 10–40)
Alkaline phosphatase (APISO): 42 U/L (ref 40–115)
BUN: 17 mg/dL (ref 7–25)
CALCIUM: 8.5 mg/dL — AB (ref 8.6–10.3)
CO2: 30 mmol/L (ref 20–32)
CREATININE: 0.88 mg/dL (ref 0.60–1.35)
Chloride: 106 mmol/L (ref 98–110)
GFR, EST NON AFRICAN AMERICAN: 118 mL/min/{1.73_m2} (ref >=60)
GFR, Est African American: 136 mL/min/{1.73_m2} (ref >=60)
GLOBULIN: 3.5 g/dL (ref 1.9–3.7)
Glucose, Bld: 96 mg/dL (ref 65–99)
POTASSIUM: 4.2 mmol/L (ref 3.5–5.3)
SODIUM: 142 mmol/L (ref 135–146)
Total Bilirubin: 0.3 mg/dL (ref 0.2–1.2)
Total Protein: 6.7 g/dL (ref 6.1–8.1)

## 2017-08-03 LAB — URINALYSIS
BILIRUBIN URINE: NEGATIVE
GLUCOSE, UA: NEGATIVE
Nitrite: NEGATIVE
Specific Gravity, Urine: 1.033 (ref 1.001–1.03)
pH: <=5 (ref 5.0–8.0)

## 2017-08-03 LAB — CBC WITH DIFFERENTIAL/PLATELET
BASOS ABS: 0 {cells}/uL (ref 0–200)
Basophils Relative: 0 %
EOS ABS: 88 {cells}/uL (ref 15–500)
Eosinophils Relative: 2 %
HCT: 26.2 % — ABNORMAL LOW (ref 38.5–50.0)
Hemoglobin: 8.2 g/dL — ABNORMAL LOW (ref 13.2–17.1)
Lymphs Abs: 167 cells/uL — ABNORMAL LOW (ref 850–3900)
MCH: 25.6 pg — AB (ref 27.0–33.0)
MCHC: 31.3 g/dL — AB (ref 32.0–36.0)
MCV: 81.9 fL (ref 80.0–100.0)
MONOS PCT: 9.5 %
MPV: 11.5 fL (ref 7.5–12.5)
NEUTROS PCT: 84.7 %
Neutro Abs: 3727 cells/uL (ref 1500–7800)
PLATELETS: 156 10*3/uL (ref 140–400)
RBC: 3.2 10*6/uL — ABNORMAL LOW (ref 4.20–5.80)
RDW: 14 % (ref 11.0–15.0)
TOTAL LYMPHOCYTE: 3.8 %
WBC: 4.4 10*3/uL (ref 3.8–10.8)
WBCMIX: 418 {cells}/uL (ref 200–950)

## 2017-08-03 LAB — URINE CYTOLOGY ANCILLARY ONLY
Chlamydia: NEGATIVE
Neisseria Gonorrhea: POSITIVE — AB

## 2017-08-03 LAB — CYTOLOGY, (ORAL, ANAL, URETHRAL) ANCILLARY ONLY
CHLAMYDIA, DNA PROBE: NEGATIVE
Neisseria Gonorrhea: POSITIVE — AB

## 2017-08-03 LAB — FLUORESCENT TREPONEMAL AB(FTA)-IGG-BLD: Fluorescent Treponemal ABS: REACTIVE — AB

## 2017-08-03 LAB — RPR TITER: RPR Titer: 1:32 {titer} — ABNORMAL HIGH

## 2017-08-03 LAB — HEPATITIS A ANTIBODY, TOTAL: Hepatitis A AB,Total: REACTIVE — AB

## 2017-08-03 LAB — HEPATITIS C ANTIBODY
Hepatitis C Ab: NONREACTIVE
SIGNAL TO CUT-OFF: 0.05 (ref <1.00)

## 2017-08-03 LAB — HIV-1 RNA ULTRAQUANT REFLEX TO GENTYP+
HIV1 RNA # SerPl PCR: 376000 copies/mL
HIV1 RNA Plas PCR-Log#: 5.575 log10copy/mL

## 2017-08-03 LAB — HEPATITIS B SURFACE ANTIBODY,QUALITATIVE: HEP B S AB: REACTIVE — AB

## 2017-08-03 LAB — REFLEX TO GENOSURE(R) MG

## 2017-08-03 LAB — RPR: RPR Ser Ql: REACTIVE — AB

## 2017-08-03 LAB — HEPATITIS B SURFACE ANTIGEN: Hepatitis B Surface Ag: NONREACTIVE

## 2017-08-03 LAB — HEPATITIS B CORE ANTIBODY, TOTAL: HEP B C TOTAL AB: NONREACTIVE

## 2017-08-03 MED ORDER — HYDROCODONE-ACETAMINOPHEN 5-325 MG PO TABS: 1.0000 | ORAL_TABLET | Freq: Four times a day (QID) | ORAL | 0 refills | Status: DC | PRN

## 2017-08-03 MED ORDER — VALACYCLOVIR HCL 1 G PO TABS: 1000.0000 mg | ORAL_TABLET | Freq: Three times a day (TID) | ORAL | 0 refills | Status: DC

## 2017-08-03 NOTE — ED Provider Notes (Signed)
[facility]-EMERGENCY DEPT Provider Note   CSN: [id number] Arrival date & time: 08/02/17  2334     History   Chief Complaint Chief Complaint  Patient presents with  . Rectal Pain    HPI Aaron Lee is a 26 y.o. male.  Patient is a 26 year old male with history of recently diagnosed HIV/AIDS.  He was found to have a CD4 count of 20.  He presents today with complaints of rectal pain and concerns that he has hemorrhoids.  He does report pain with bowel movements and wiping.  He denies any blood.  He was started on Augmentin and Bactrim for a finger infection today as well as HIV triple therapy by infectious disease.  He denies any fevers or chills.   The history is provided by the patient.    Past Medical History:  Diagnosis Date  . HIV (human immunodeficiency virus infection) [facility](HCC)     Patient Active Problem List   Diagnosis Date Noted  . Anemia 08/02/2017  . Healthcare maintenance 08/02/2017  . AIDS (acquired immune deficiency syndrome) ([facility]) 07/26/2017  . External hemorrhoid 07/22/2017  . Protein-calorie malnutrition ([facility]) 07/22/2017  . Skin breakdown 07/22/2017  . Paronychia of finger of left hand 07/22/2017  . Thrush, oral 07/22/2017  . Rash 03/31/2017  . Seasonal allergic rhinitis 03/31/2017    History reviewed. No pertinent surgical history.     Home Medications    Prior to Admission medications   Medication Sig Start Date End Date Taking? Authorizing Provider  amoxicillin-clavulanate (AUGMENTIN) 875-125 MG tablet Take 1 tablet 2 (two) times daily for 10 days by mouth. 08/02/17 08/12/17  Blanchard Kelchixon, Stephanie N, NP  bictegravir-emtricitabine-tenofovir AF (BIKTARVY) 50-200-25 MG TABS tablet Take 1 tablet daily by mouth. 08/02/17   Blanchard Kelchixon, Stephanie N, NP  hydrocortisone 2.5 % cream Apply topically 2 (two) times daily. To external hemorrhoids if painful 07/22/17   Fuller Planice, Christopher W, MD  pantoprazole (PROTONIX) 20 MG tablet Take 1 tablet  (20 mg total) by mouth daily. 07/01/17   Gilda CreasePollina, Christopher J, MD  sulfamethoxazole-trimethoprim (BACTRIM DS,SEPTRA DS) 800-160 MG tablet Take 1 tablet daily by mouth. 08/02/17   Dixon, Gomez CleverlyStephanie N, NP  Triamcinolone Acetonide (TRIAMCINOLONE 0.1 % CREAM : EUCERIN) CREA Apply 1 application topically 2 (two) times daily. Patient not taking: Reported on 08/02/2017 07/01/17   Gilda CreasePollina, Christopher J, MD    Family History Family History  Problem Relation Age of Onset  . Cancer Mother   . Hypertension Mother     Social History Social History   Tobacco Use  . Smoking status: Never Smoker  . Smokeless tobacco: Never Used  Substance Use Topics  . Alcohol use: No    Comment: occ  . Drug use: Yes    Types: Marijuana     Allergies   Patient has no known allergies.   Review of Systems Review of Systems  All other systems reviewed and are negative.    Physical Exam Updated Vital Signs BP 102/69 (BP Location: Left Arm)   Pulse (!) 117   Temp 98.3 F (36.8 C) (Oral)   Resp 18   Ht 5\' 3"  (1.6 m)   Wt 43.5 kg (96 lb)   SpO2 100%   BMI 17.01 kg/m   Physical Exam  Constitutional: He is oriented to person, place, and time. He appears well-developed and well-nourished. No distress.  HENT:  Head: Normocephalic and atraumatic.  Neck: Normal range of motion. Neck supple.  Genitourinary:  Genitourinary Comments: The patient  has multiple, rounded, well-defined sores to the perianal region.  There are no hemorrhoids noted.  Neurological: He is alert and oriented to person, place, and time.  Skin: He is not diaphoretic.  Nursing note and vitals reviewed.    ED Treatments / Results  Labs (all labs ordered are listed, but only abnormal results are displayed) Labs Reviewed  HSV CULTURE AND TYPING    EKG  EKG Interpretation None       Radiology No results found.  Procedures Procedures (including critical care time)  Medications Ordered in ED Medications - No data  to display   Initial Impression / Assessment and Plan / ED Course  I have reviewed the triage vital signs and the nursing notes.  Pertinent labs & imaging results that were available during my care of the patient were reviewed by me and considered in my medical decision making (see chart for details).  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  He has multiple, large sores present.  A viral culture was obtained and I will start him on Valtrex.  He needs to follow-up with his infectious disease physician later this week.  Final Clinical Impressions(s) / ED Diagnoses   Final diagnoses:  None    ED Discharge Orders    None       Geoffery Lyonselo, Tigerlily Christine, MD 08/03/17 [phone]

## 2017-08-03 NOTE — Discharge Instructions (Signed)
Valtrex as prescribed.  Hydrocodone is prescribed as needed for pain.  Follow-up with your infectious disease doctor later this week, and return to the emergency department if symptoms significantly worsen or change.

## 2017-08-03 NOTE — Telephone Encounter (Signed)
[facility] and to schedule appointment.  [facility] called patient's mother at the cell phone number, who attempted to make the appointment.  [facility] informed mother of HIPPA and stated that needed patient's verbal consent first before speaking with her.  [facility] then called the patient at the home number and  introduced self and received his verbal consent to speak with his mother to make appointment for behavioral health services.  [facility] then [facility]'s mother back and scheduled the appointment for Mon Nov 26th.  Vergia AlbertsSherry Dhana Totton, [facility]

## 2017-08-04 ENCOUNTER — Telehealth: Payer: Self-pay | Admitting: *Deleted

## 2017-08-04 ENCOUNTER — Ambulatory Visit (INDEPENDENT_AMBULATORY_CARE_PROVIDER_SITE_OTHER): Payer: Medicaid Other

## 2017-08-04 DIAGNOSIS — A549 Gonococcal infection, unspecified: Secondary | ICD-10-CM

## 2017-08-04 DIAGNOSIS — A539 Syphilis, unspecified: Secondary | ICD-10-CM

## 2017-08-04 LAB — T-HELPER CELL (CD4) - (RCID CLINIC ONLY): CD4 T CELL HELPER: 4 % — AB (ref 33–55)

## 2017-08-04 MED ORDER — AZITHROMYCIN 250 MG PO TABS
250.0000 mg | ORAL_TABLET | Freq: Once | ORAL | Status: AC
  Administered 2017-08-04: 250 mg via ORAL

## 2017-08-04 MED ORDER — PENICILLIN G BENZATHINE 1200000 UNIT/2ML IM SUSP
1.2000 10*6.[IU] | Freq: Once | INTRAMUSCULAR | Status: AC
  Administered 2017-08-04: 1.2 10*6.[IU] via INTRAMUSCULAR

## 2017-08-04 MED ORDER — CEFTRIAXONE SODIUM 250 MG IJ SOLR
250.0000 mg | Freq: Once | INTRAMUSCULAR | Status: AC
  Administered 2017-08-04: 250 mg via INTRAMUSCULAR

## 2017-08-04 NOTE — Progress Notes (Signed)
Patient is here for first set of bicillin injections.    He also received treatment for gonorrhea per Rexene AlbertsStephanie Dixon, NP .    Pt was given Bicillin LA left and right upper outer gluteus .  Rocephin 250 mg left anterior thigh  Azithromycin 1 gram orally.   Upon administration of Bicillin I noticed open sores on his left and right intergluteal cleft. I informed Rexene AlbertsStephanie Dixon, NP of findings and she would like to see them at next visit.    Laurell Josephsammy K Reina Wilton, RN

## 2017-08-04 NOTE — Telephone Encounter (Signed)
It was in the original note - 250 mg Rocephin x 1 and 1 gm PO azithromycin   Thank you so much!

## 2017-08-04 NOTE — Telephone Encounter (Signed)
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   Please call patient to schedule nurse visit to be for treatment of his Gonorrhea and Syphilis with Bicillin 2.4 million units IM weekly x 3 doses and 250 mg IM ceftriaxone and 1 gm azithromycin x 1 dose.  Thank you!

## 2017-08-04 NOTE — Progress Notes (Signed)
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   Please call patient to schedule nurse visit to be for treatment of his Gonorrhea and Syphilis with Bicillin 2.4 million units IM weekly x 3 doses and 250 mg IM ceftriaxone and 1 gm azithromycin x 1 dose.  Thank you!

## 2017-08-04 NOTE — Telephone Encounter (Signed)
Per note from RewStephanie called the patient mother and advised of need for treatment for +RPR. He is coming today 08/04/17 for treatment and needs to be scheduled for additional 2 visits a week apart.

## 2017-08-05 LAB — HSV CULTURE AND TYPING

## 2017-08-06 LAB — HLA B*5701: HLA-B*5701 w/rflx HLA-B High: NEGATIVE

## 2017-08-06 LAB — QUANTIFERON TB GOLD ASSAY (BLOOD)
Mitogen-Nil: 0.16 IU/mL
QUANTIFERON TB AG MINUS NIL: 0 [IU]/mL
QUANTIFERON(R)-TB GOLD: UNDETERMINED — AB
Quantiferon Nil Value: 0.18 IU/mL

## 2017-08-11 LAB — RFLX HIV-1 INTEGRASE GENOTYPE: HIV-1 Genotype: DETECTED — AB

## 2017-08-11 LAB — HIV-1 RNA ULTRAQUANT REFLEX TO GENTYP+
HIV 1 RNA Quant: 192000 Copies/mL — ABNORMAL HIGH
HIV-1 RNA QUANT, LOG: 5.28 {Log_copies}/mL — AB

## 2017-08-16 ENCOUNTER — Ambulatory Visit (INDEPENDENT_AMBULATORY_CARE_PROVIDER_SITE_OTHER): Payer: Medicaid Other | Admitting: Pharmacist

## 2017-08-16 ENCOUNTER — Ambulatory Visit (INDEPENDENT_AMBULATORY_CARE_PROVIDER_SITE_OTHER): Payer: Medicaid Other

## 2017-08-16 ENCOUNTER — Ambulatory Visit (INDEPENDENT_AMBULATORY_CARE_PROVIDER_SITE_OTHER): Payer: Medicaid Other | Admitting: Licensed Clinical Social Worker

## 2017-08-16 DIAGNOSIS — F1210 Cannabis abuse, uncomplicated: Secondary | ICD-10-CM

## 2017-08-16 DIAGNOSIS — A539 Syphilis, unspecified: Secondary | ICD-10-CM | POA: Diagnosis present

## 2017-08-16 DIAGNOSIS — B20 Human immunodeficiency virus [HIV] disease: Secondary | ICD-10-CM | POA: Diagnosis not present

## 2017-08-16 MED ORDER — PANTOPRAZOLE SODIUM 20 MG PO TBEC: 20.0000 mg | DELAYED_RELEASE_TABLET | Freq: Every day | ORAL | 3 refills | Status: DC

## 2017-08-16 MED ORDER — PENICILLIN G BENZATHINE 1200000 UNIT/2ML IM SUSP
1.2000 10*6.[IU] | Freq: Once | INTRAMUSCULAR | Status: AC
  Administered 2017-08-16: 1.2 10*6.[IU] via INTRAMUSCULAR

## 2017-08-16 MED ORDER — BICTEGRAVIR-EMTRICITAB-TENOFOV 50-200-25 MG PO TABS: 1.0000 | ORAL_TABLET | Freq: Every day | ORAL | 11 refills | Status: DC

## 2017-08-16 MED ORDER — SULFAMETHOXAZOLE-TRIMETHOPRIM 800-160 MG PO TABS: 1.0000 | ORAL_TABLET | Freq: Every day | ORAL | 2 refills | Status: DC

## 2017-08-16 MED ORDER — PANTOPRAZOLE SODIUM 20 MG PO TBEC
20.0000 mg | DELAYED_RELEASE_TABLET | Freq: Every day | ORAL | 3 refills | Status: DC
Start: 2017-08-16 — End: 2018-03-14

## 2017-08-16 NOTE — Addendum Note (Signed)
Addended by: Robinette HainesKUPPELWEISER, Odeal Welden L on: 08/16/2017 05:16 PM   Modules accepted: Orders

## 2017-08-16 NOTE — Progress Notes (Signed)
HPI: Aaron Lee is a 26 y.o. male who presents to the [facility] pharmacy clinic for HIV follow-up.   Allergies: No Known Allergies  Past Medical History: Past Medical History:  Diagnosis Date  . HIV (human immunodeficiency virus infection) (HCC)     Social History: Social History   Socioeconomic History  . Marital status: Single    Spouse name: Not on file  . Number of children: Not on file  . Years of education: Not on file  . Highest education level: Not on file  Social Needs  . Financial resource strain: Not on file  . Food insecurity - worry: Not on file  . Food insecurity - inability: Not on file  . Transportation needs - medical: Not on file  . Transportation needs - non-medical: Not on file  Occupational History  . Not on file  Tobacco Use  . Smoking status: Never Smoker  . Smokeless tobacco: Never Used  Substance and Sexual Activity  . Alcohol use: No    Comment: occ  . Drug use: Yes    Types: Marijuana  . Sexual activity: Not on file  Other Topics Concern  . Not on file  Social History Narrative  . Not on file    Current Regimen: Biktarvy  Labs: HIV 1 RNA Quant (Copies/mL)  Date Value  08/02/2017 192,000 (H)   CD4 T Cell Abs (/uL)  Date Value  08/02/2017 <10 (L)  07/26/2017 20 (L)   Hep B S Ab (no units)  Date Value  08/02/2017 REACTIVE (A)   Hepatitis B Surface Ag (no units)  Date Value  08/02/2017 NON-REACTIVE    CrCl: Estimated Creatinine Clearance: 77.6 mL/min (by C-G formula based on SCr of 0.88 mg/dL).  Lipids: No results found for: CHOL, TRIG, HDL, CHOLHDL, VLDL, LDLCALC  Assessment: Aaron Lee is here today for HIV follow-up. He is a new diagnosis and just saw our NP Stephanie on 11/12.  He was started on Biktarvy and Bactrim as his CD4 count was <10.  He comes in today with his mother.  They brought all of his medications with him including his pill box.  She states that he hasn't missed any doses of his HIV medications or the  Bactrim since starting. She fills his pill box for him.  He is also taking Augmentin for his finger and valtrex as well.  He was started on Protonix after he had a scope done recently.  [facility], but I discussed switching them to [facility] specialty pharmacy [facility] and they are agreeable to that change.  His mother would like them mailed to her house.  Of note, he was positive for syphilis and gonorrhea at the last visit.  He received one Bicillin injection the week before last but did not schedule a f/u last week for his 2nd dose.  Will have Tammy, RN, give him his 2nd Bicillin injection and schedule him for next Monday as well to finish out the 3 weekly doses.  He states he feels better since starting the Biktarvy and his appetite is much better.  I explained that [facility] will be [facility] for [facility].  I also spent time going over his CD4 count and why he needs to take the Bactrim.  I told him he will likely be on this for several months. He isn't having any side effects that he notices from the medications.  I will get labs today and have him come back to  see [name] in ~1 month.   Plans: - Continue Biktarvy PO once daily - Continue Bactrim 1 DS tab PO once daily - Bicillin #2 today, #3 next Monday - HIV RNA and CD4 today - F/u with [name] 09/28/17 at 11am  Cassie L. Kuppelweiser, PharmD, AAHIVP, CPP Infectious Diseases Clinical Pharmacist [facility] 08/16/2017, 3:07 PM

## 2017-08-16 NOTE — BH Specialist Note (Signed)
Integrated Behavioral Health Initial Visit  MRN: [id number] Name: Aaron Lee  Number of Integrated Behavioral Health Clinician visits:: 1/6 Session Start time: 11:24 pm  Session End time: 11:46 pm Total time: 22 mins  Type of Service: Integrated Behavioral Health- Individual/Family Interpretor:No. Interpretor Name and Language: N/A   SUBJECTIVE: Aaron HeldMarcus D Tyree is a 26 y.o. male accompanied by Mother Patient was referred by Rexene AlbertsStephanie Dixon for being a new referral.  Patient reports the following symptoms/concerns: Patient reported that he is well adjusted to his diagnosis and denied depressive symptoms.  Patient stated that he initially had a period of sadness when he was first diagnosed but he "got over it".  His mother also confirmed that the patient is well adjusted with his diagnosis and does not currently experience depressive mood.  Patient presented with lots of humor during the interview and smiles.  Patient has a calm and happy disposition and denied the experience of stress in his life.  Patient reported smoking Marijuana, smoking at least a half of blunt daily.  Patient is not completely satisfied with his current life and desires to work.  Patient reported knowledge that his Marijuana use will interfere with locating and maintaining a job.  Patient expressed greater motivation to work for financial reasons than use Marijuana.  Patient expressed desire for assistance in locating an agency to assist with locating a job or a job Psychologist, occupationalcoach.  Severity of problem: mild  OBJECTIVE: Mood: happy and Affect: Appropriate Risk of harm to self or others: No plan to harm self or others  LIFE CONTEXT: Family and Social: Patient lives with his sister and her children in [place].  His mother lives separately but visits frequently. Patient desires to live independently and stated that he is trying to locate housing through [employer].  The patient has friends that he plays basketball and video  games with. School/Work: Patient does not work and receives Sport and exercise psychologistDI and spends the majority of the day playing games and interacting with his family. Self-Care: Patient is able to tend to his ADL's and remain treatment compliant.  ASSESSMENT: Patient is currently experiencing Cannabis Use but is more motivated to stop use to locate employment.  Patient may benefit from job placement assistance and job coaching.  Patient may also benefit from receiving long-term services and supports from an agency that provides services for individuals with intellectual and developmental disabilities.  GOALS ADDRESSED: Patient will: 1. Reduce symptoms of: substance use 2. Increase knowledge and/or ability of: coping skills, healthy habits and self-management skills  3. Demonstrate ability to: Increase healthy adjustment to current life circumstances, Increase adequate support systems for patient/family and Decrease self-medicating behaviors  INTERVENTIONS: Interventions utilized: Motivational Interviewing and Link to [employer] Resources   PLAN: 1. [facility] will locate and research potential agencies that provide services for individuals with intellectual and developmental disabilities, and provide information to patient's mother  Vergia AlbertsSherry Tria Noguera, [facility]

## 2017-08-16 NOTE — Progress Notes (Signed)
Patient here for #2 Bicillin 2.4 mil . He missed dose for last week.  Per Cassie, K. RPH it is ok for this to be his # 2 injection. Patient has been informed he must return next Monday for injection #3.  Open areas noted at last visit on buttocks are healing.  Areas are no longer flesh pink. Scabs are present and drainage has discontinued.    Laurell Josephsammy  K King, RN

## 2017-08-17 LAB — T-HELPER CELL (CD4) - (RCID CLINIC ONLY)
CD4 T CELL HELPER: 32 % — AB (ref 33–55)
CD4 T Cell Abs: 640 /uL (ref 400–2700)

## 2017-08-18 LAB — HIV-1 RNA QUANT-NO REFLEX-BLD
HIV 1 RNA Quant: 619 copies/mL — ABNORMAL HIGH
HIV-1 RNA Quant, Log: 2.79 Log copies/mL — ABNORMAL HIGH

## 2017-08-23 ENCOUNTER — Encounter: Payer: Self-pay | Admitting: Licensed Clinical Social Worker

## 2017-08-23 ENCOUNTER — Ambulatory Visit: Payer: Medicaid Other | Admitting: *Deleted

## 2017-08-23 ENCOUNTER — Ambulatory Visit: Payer: Medicaid Other

## 2017-08-23 ENCOUNTER — Ambulatory Visit (INDEPENDENT_AMBULATORY_CARE_PROVIDER_SITE_OTHER): Payer: Medicaid Other | Admitting: *Deleted

## 2017-08-23 DIAGNOSIS — A539 Syphilis, unspecified: Secondary | ICD-10-CM

## 2017-08-23 MED ORDER — PENICILLIN G BENZATHINE 1200000 UNIT/2ML IM SUSP
1.2000 10*6.[IU] | Freq: Once | INTRAMUSCULAR | Status: AC
  Administered 2017-08-23: 1.2 10*6.[IU] via INTRAMUSCULAR

## 2017-08-24 ENCOUNTER — Ambulatory Visit: Payer: Medicaid Other

## 2017-08-24 ENCOUNTER — Other Ambulatory Visit: Payer: Self-pay | Admitting: Pharmacist

## 2017-08-24 MED FILL — PANTOPRAZOLE SOD DR 20 MG T: 20 | 30 days supply | Qty: 30 | Fill #0

## 2017-08-24 MED FILL — BIKTARVY 50-200-25 MG TABS: 50-200-25 | 30 days supply | Qty: 30 | Fill #0

## 2017-08-24 MED FILL — SULFAMETHOXAZOLE-TMP DS TAB: 800-160 | 30 days supply | Qty: 30 | Fill #0

## 2017-08-25 ENCOUNTER — Encounter: Payer: Self-pay | Admitting: *Deleted

## 2017-08-25 NOTE — Progress Notes (Signed)
Bicillin injection for syphilis

## 2017-08-26 DIAGNOSIS — Z8619 Personal history of other infectious and parasitic diseases: Secondary | ICD-10-CM | POA: Insufficient documentation

## 2017-08-26 NOTE — Progress Notes (Signed)
Nurse visit

## 2017-09-15 MED FILL — PANTOPRAZOLE SOD DR 20 MG T: 20 | 30 days supply | Qty: 30 | Fill #1

## 2017-09-15 MED FILL — SULFAMETHOXAZOLE-TMP DS TAB: 800-160 | 30 days supply | Qty: 30 | Fill #1

## 2017-09-15 MED FILL — BIKTARVY 50-200-25 MG TABS: 50-200-25 | 30 days supply | Qty: 30 | Fill #1

## 2017-09-27 ENCOUNTER — Ambulatory Visit (INDEPENDENT_AMBULATORY_CARE_PROVIDER_SITE_OTHER): Payer: Medicaid Other | Admitting: Licensed Clinical Social Worker

## 2017-09-27 ENCOUNTER — Ambulatory Visit (INDEPENDENT_AMBULATORY_CARE_PROVIDER_SITE_OTHER): Payer: Medicaid Other | Admitting: Infectious Diseases

## 2017-09-27 ENCOUNTER — Encounter: Payer: Self-pay | Admitting: Infectious Diseases

## 2017-09-27 VITALS — BP 117/76 | HR 75 | Temp 98.2°F | Ht 63.0 in | Wt 115.0 lb

## 2017-09-27 DIAGNOSIS — Z23 Encounter for immunization: Secondary | ICD-10-CM

## 2017-09-27 DIAGNOSIS — L03012 Cellulitis of left finger: Secondary | ICD-10-CM

## 2017-09-27 DIAGNOSIS — F819 Developmental disorder of scholastic skills, unspecified: Secondary | ICD-10-CM

## 2017-09-27 DIAGNOSIS — Z0000 Encounter for general adult medical examination without abnormal findings: Secondary | ICD-10-CM

## 2017-09-27 DIAGNOSIS — L2089 Other atopic dermatitis: Secondary | ICD-10-CM

## 2017-09-27 DIAGNOSIS — Z21 Asymptomatic human immunodeficiency virus [HIV] infection status: Secondary | ICD-10-CM

## 2017-09-27 DIAGNOSIS — B20 Human immunodeficiency virus [HIV] disease: Secondary | ICD-10-CM

## 2017-09-27 DIAGNOSIS — Z8619 Personal history of other infectious and parasitic diseases: Secondary | ICD-10-CM

## 2017-09-27 DIAGNOSIS — F1210 Cannabis abuse, uncomplicated: Secondary | ICD-10-CM

## 2017-09-27 MED ORDER — TRIAMCINOLONE ACETONIDE 0.5 % EX OINT: 1.0000 "application " | TOPICAL_OINTMENT | Freq: Two times a day (BID) | CUTANEOUS | 3 refills | Status: DC

## 2017-09-27 MED ORDER — CLOBETASOL PROPIONATE 0.05 % EX LOTN: 1.0000 "application " | TOPICAL_LOTION | Freq: Two times a day (BID) | CUTANEOUS | 0 refills | Status: DC

## 2017-09-27 MED FILL — TRIAMCINOLONE 0.5% OINTMENT: 0.5 | 15 days supply | Qty: 30 | Fill #0

## 2017-09-27 NOTE — Assessment & Plan Note (Signed)
Pneumococcal vaccine today. Will need Menveo and HPV series at upcoming visits. Hep A/B immune.

## 2017-09-27 NOTE — Assessment & Plan Note (Signed)
CD4 reconstituted very quickly. Wounds have all healed over. Congratulated him on good adherence with pill box and picture reference. Continue Biktarvy and Bactrim. Will check CD4 and VL today - if OK will stop bactrim at next appointment. Return in 2 months.

## 2017-09-27 NOTE — Assessment & Plan Note (Signed)
Appears c/w atopic dermatitis. Will send in rx for clobetasol lotion to this area. Has tried a few other topicals to this area in the past as it was assumed to be d/t different colognes/body sprays.

## 2017-09-27 NOTE — Assessment & Plan Note (Signed)
Recheck RPR today. Counseled on risk of re-infection without barrier methods during oral/anal sexual encounters.

## 2017-09-27 NOTE — Assessment & Plan Note (Signed)
Resolved after augmentin and consistent ART use

## 2017-09-27 NOTE — Patient Instructions (Addendum)
Continue your Biktarvy (pink/red pill) and Bactrim (white pill) every day. We will check your blood work again today to check your levels. So far the medication is working very well for you.   Will send in a lotion you can try to the rash on your chest twice a day for about 1 week.   Tips for Successful Daily Medication Habits: 1. Set a reminder on your phone  2. Try filling out a pill box for the week - pick a day and put one pill for every day during the week so you know right away if you missed a pill.  3. Have a trusted family member ask you about your medications.  4. Smartphone app   Please come back in 2 months to see Aaron Lee

## 2017-09-27 NOTE — Progress Notes (Signed)
Aaron HeldMarcus D Lee 11/25/1990 [id number] PCP: Aaron Lee, Nina, MD   Reason for Visit: HIV follow up   Brief Narrative: 27 y.o. AA male with HIV infection. Diagnosed in November 2018 with CD4 nadir < 10, VL 192,000 copies. HIV Risk: MSM. OI Hx: none  Genotype: 07/2017 - K103S (R-nevirapine)  Patient Active Problem List   Diagnosis Date Noted  . Cognitive developmental delay 10/01/2017  . History of syphilis 08/26/2017  . Anemia 08/02/2017  . Healthcare maintenance 08/02/2017  . HIV (human immunodeficiency virus infection) (HCC) 07/26/2017  . External hemorrhoid 07/22/2017  . Protein-calorie malnutrition (HCC) 07/22/2017  . Atopic dermatitis 03/31/2017  . Seasonal allergic rhinitis 03/31/2017   HPI/ROS:  Berna SpareMarcus returns feeling well today. His paronychia has resolved completely and anorectal ulcerations are gone. He has had a better appetite and noticed he is feeling more energy and has gained a few pounds back. He has done well taking his Biktarvy and Bactrim daily and has not missed any doses. He does not know the names of his meds but has a picture of the medications in his pill box to help him know how to set this up week to week. He is now living with his father. Tells me his mood is 'up and down.' Continues to use marijuana daily. No side effects or concerns with Biktarvy or access to this medication. Reports no complaints today suggestive of associated opportunistic infection or advancing HIV disease such as fevers, night sweats, weight loss, anorexia, cough, SOB, nausea, vomiting, diarrhea, headache, sensory changes, lymphadenopathy or oral thrush.   Only complaint today is an itchy rash that is on his chest. He has tried other creams in the past for this but it still persists. He cannot tell me what he has used in the past nor how long he has used it. No new hygiene products, laundry soaps. Previously thought to be related to new cologne (of which he has stopped). Describes it to be  raised and itchy. Has not taken anything for this currently.   He has not yet received pneumonia vaccine. Flu shot was given in October. He is uncertain as to his childhood vaccines.   Review of Systems  Constitutional: Negative for chills, fever, malaise/fatigue and weight loss.  HENT: Negative for sore throat.   Eyes: Negative for double vision.  Respiratory: Negative for cough, sputum production and shortness of breath.   Cardiovascular: Negative.   Gastrointestinal: Negative for abdominal pain, diarrhea and vomiting.  Genitourinary: Negative.   Musculoskeletal: Negative for joint pain, myalgias and neck pain.  Skin: Negative for rash.       Rash to chest   Neurological: Negative for headaches.  Psychiatric/Behavioral: Negative for depression and substance abuse. The patient is not nervous/anxious.     Past Medical History:  Diagnosis Date  . HIV (human immunodeficiency virus infection) (HCC)     Social History   Tobacco Use  . Smoking status: Light Tobacco Smoker    Packs/day: 0.10    Types: Cigarettes  . Smokeless tobacco: Never Used  Substance Use Topics  . Alcohol use: No    Comment: occ  . Drug use: Yes    Types: Marijuana    Family History  Problem Relation Age of Onset  . Cancer Mother   . Hypertension Mother     No Known Allergies   Objective: Physical Exam  Constitutional: He is oriented to person, place, and time and well-developed, well-nourished, and in no distress.  Thin appearing AA male.  Easily distracted with his phone.   HENT:  Mouth/Throat: No oral lesions. Normal dentition. No dental caries. No oropharyngeal exudate.  Eyes: No scleral icterus.  Cardiovascular: Normal rate, regular rhythm and normal heart sounds.  Pulmonary/Chest: Effort normal and breath sounds normal.    Abdominal: Soft. He exhibits no distension. There is no tenderness.  Musculoskeletal: Normal range of motion. He exhibits no edema.  Lymphadenopathy:    He has no  cervical adenopathy.    He has no axillary adenopathy.  Neurological: He is alert and oriented to person, place, and time.  Skin: Skin is warm and dry. No rash noted.  Psychiatric: Mood and affect normal.     Vitals:   09/27/17 1333  BP: 117/76  Pulse: 75  Temp: 98.2 F (36.8 C)    Lab Results Lab Results  Component Value Date   WBC 4.4 08/02/2017   HGB 8.2 (L) 08/02/2017   HCT 26.2 (L) 08/02/2017   MCV 81.9 08/02/2017   PLT 156 08/02/2017    Lab Results  Component Value Date   CREATININE 0.88 08/02/2017   BUN 17 08/02/2017   NA 142 08/02/2017   K 4.2 08/02/2017   CL 106 08/02/2017   CO2 30 08/02/2017    Lab Results  Component Value Date   ALT 9 08/02/2017   AST 15 08/02/2017   BILITOT 0.3 08/02/2017    No results found for: CHOL, HDL, LDLCALC, LDLDIRECT, TRIG, CHOLHDL HIV 1 RNA Quant  Date Value  09/27/2017 28 copies/mL (H)  08/16/2017 619 copies/mL (H)  08/02/2017 192,000 Copies/mL (H)   CD4 T Cell Abs (/uL)  Date Value  09/27/2017 190 (L)  08/16/2017 640  08/02/2017 <10 (L)   No results found for: HIV1GENOSEQ No results found for: HAV Lab Results  Component Value Date   HEPBSAG NON-REACTIVE 08/02/2017   HEPBSAB REACTIVE (A) 08/02/2017   No results found for: HCVAB Lab Results  Component Value Date   CHLAMYDIAWP Negative 08/02/2017   CHLAMYDIAWP Negative 08/02/2017   N **POSITIVE** (A) 08/02/2017   N **POSITIVE** (A) 08/02/2017   No results found for: GCPROBEAPT No results found for: QUANTGOLD  I have reviewed all available documents of his medical record.    Assessment and Plan:  Problem List Items Addressed This Visit      Nervous and Auditory   Cognitive developmental delay     Musculoskeletal and Integument   Atopic dermatitis    Appears c/w atopic dermatitis. Will send in rx for clobetasol lotion to this area. Has tried a few other topicals to this area in the past as it was assumed to be d/t different colognes/body sprays.        RESOLVED: Paronychia of finger of left hand    Resolved after augmentin and consistent ART use         Other   Healthcare maintenance    Pneumococcal vaccine today. Will need Menveo and HPV series at upcoming visits. Hep A/B immune.       Relevant Orders   Pneumococcal polysaccharide vaccine 23-valent greater than or equal to 2yo subcutaneous/IM (Completed)   History of syphilis    Recheck RPR today. Counseled on risk of re-infection without barrier methods during oral/anal sexual encounters.       Relevant Orders   RPR   HIV (human immunodeficiency virus infection) (HCC) - Primary (Chronic)    CD4 reconstituted very quickly. Wounds have all healed over. Congratulated him on good adherence with pill box and picture reference.  Continue Biktarvy and Bactrim. Will check CD4 and VL today - if OK will stop bactrim at next appointment. Return in 2 months.       Relevant Orders   T-helper cell (CD4)- ([facility] clinic only) (Completed)   HIV 1 RNA quant-no reflex-bld (Completed)   Pneumococcal polysaccharide vaccine 23-valent greater than or equal to 2yo subcutaneous/IM (Completed)    Other Visit Diagnoses    Need for pneumococcal vaccination       Relevant Orders   Pneumococcal polysaccharide vaccine 23-valent greater than or equal to 2yo subcutaneous/IM (Completed)     Rexene Alberts, MSN, NP-C [facility]  09/27/17 2:05 pm

## 2017-09-28 ENCOUNTER — Ambulatory Visit: Payer: Medicaid Other | Admitting: Infectious Diseases

## 2017-09-28 LAB — T-HELPER CELL (CD4) - (RCID CLINIC ONLY)
CD4 T CELL ABS: 190 /uL — AB (ref 400–2700)
CD4 T CELL HELPER: 20 % — AB (ref 33–55)

## 2017-09-28 NOTE — BH Specialist Note (Signed)
Integrated Behavioral Health Follow Up Visit  MRN: [id number] Name: Aaron HeldMarcus D Kowalczyk  Number of Integrated Behavioral Health Clinician visits: 2/6 Session Start time: 2:03 pm  Session End time: 2:11 pm Total time: 8 mins  Type of Service: Integrated Behavioral Health- Individual/Family Interpretor:No. Interpretor Name and Language: N/A  SUBJECTIVE: Aaron Lee is a 27 y.o. male accompanied by self Patient was referred by Rexene AlbertsStephanie Dixon for being a new referral.  Patient provided an update of his Marijuana use.  Patient reported smoking Marijuana "off and on" because he now lives with his father.  Patient denied smoking Marijuana everyday and stated that his father does not like him smoking Marijuana.  Patient was able to list the benefits of not smoking Marijuana as "keeping money in my pocket".  Patient updated on the status of looking for work.  Patient stated that he is still looking for work with his mother.  Intervention: [facility] provided [facility] with information for [facility] programs for residential and vocational support.  [facility] engaged patient in education and discussion of services provided which caused patient to evaluate the level of independence desired.  Patient stated that he would give the information to his mother to contact the agency.  Severity of problem: mild  OBJECTIVE: Mood: happy and Affect: Appropriate Risk of harm to self or others: No plan to harm self or others  ASSESSMENT: Patient is currently experiencing Cannabis Use and is not motivated to stop all use.  Patient was receptive to and may benefit from engaging assistance from [facility] for residential and vocational services.   GOALS ADDRESSED: Patient will: 1.  Reduce symptoms of: substance use  2.  Increase knowledge and/or ability of: coping skills, healthy habits and self-management skills  3.  Demonstrate ability to: Increase healthy adjustment to current life circumstances, Increase adequate  support systems for patient/family and Decrease self-medicating behaviors  INTERVENTIONS: Interventions utilized:  Motivational Interviewing and Link to [employer] Resources  PLAN: 1. Behavioral recommendations: Patient and family will contact [facility] for assistance with residential and vocational support coordination. 2. Referral(s): [employer]:  service coordination from [facility]  Zoraya Fiorenza, [place]

## 2017-09-29 ENCOUNTER — Telehealth: Payer: Self-pay | Admitting: *Deleted

## 2017-09-29 LAB — HIV-1 RNA QUANT-NO REFLEX-BLD
HIV 1 RNA Quant: 28 copies/mL — ABNORMAL HIGH
HIV-1 RNA Quant, Log: 1.45 Log copies/mL — ABNORMAL HIGH

## 2017-09-29 NOTE — Telephone Encounter (Signed)
Prior Authorization needed for Clobetasol lotion through Medicaid.  Form completed and faxed to Medicaid.  May take up to 5 business days for authorization.

## 2017-09-29 NOTE — Telephone Encounter (Signed)
Thank you Angelique Blonderenise. Hopefully this will help his rash as he has had several others.

## 2017-10-01 DIAGNOSIS — F819 Developmental disorder of scholastic skills, unspecified: Secondary | ICD-10-CM | POA: Insufficient documentation

## 2017-10-04 ENCOUNTER — Telehealth: Payer: Self-pay | Admitting: *Deleted

## 2017-10-04 NOTE — Telephone Encounter (Signed)
Left message asking patient to call back about his lab results. Will follow. Andree CossHowell, Michelle M, RN

## 2017-10-04 NOTE — Telephone Encounter (Signed)
-----   Message from Blanchard KelchStephanie N Dixon, NP sent at 10/01/2017 12:10 PM EST ----- Please call patient to inform him that his CD4 is still rebuilding and he needs to continue taking and refilling his Bactrim until our next visit.   Thank you!

## 2017-10-07 ENCOUNTER — Ambulatory Visit: Payer: Medicaid Other

## 2017-10-07 NOTE — Telephone Encounter (Signed)
According to the patient's mother the patient is leaving for [place] tomorrow, 10/08/17.  RN asked the patient's mother to bring him to [facility] this afternoon so that treatment can be started.  RN placed the patient on the Nurse Schedule for this afternoon.

## 2017-10-13 MED FILL — BIKTARVY 50-200-25 MG TABS: 50-200-25 | 30 days supply | Qty: 30 | Fill #2

## 2017-10-13 MED FILL — SULFAMETHOXAZOLE-TMP DS TAB: 800-160 | 30 days supply | Qty: 30 | Fill #2

## 2017-11-08 ENCOUNTER — Other Ambulatory Visit: Payer: Self-pay | Admitting: Pharmacist

## 2017-11-08 DIAGNOSIS — B20 Human immunodeficiency virus [HIV] disease: Secondary | ICD-10-CM

## 2017-11-08 MED ORDER — SULFAMETHOXAZOLE-TRIMETHOPRIM 800-160 MG PO TABS: 1.0000 | ORAL_TABLET | Freq: Every day | ORAL | 5 refills | Status: DC

## 2017-11-08 MED FILL — SULFAMETHOXAZOLE-TMP DS TAB: 800-160 | 30 days supply | Qty: 30 | Fill #0

## 2017-11-08 MED FILL — BIKTARVY 50-200-25 MG TABS: 50-200-25 | 30 days supply | Qty: 30 | Fill #3

## 2017-11-11 ENCOUNTER — Ambulatory Visit: Payer: Medicaid Other

## 2017-11-11 DIAGNOSIS — Z8619 Personal history of other infectious and parasitic diseases: Secondary | ICD-10-CM

## 2017-11-12 LAB — FLUORESCENT TREPONEMAL AB(FTA)-IGG-BLD: Fluorescent Treponemal ABS: REACTIVE — AB

## 2017-11-12 LAB — RPR: RPR: REACTIVE — AB

## 2017-11-12 LAB — RPR TITER: RPR Titer: 1:16 {titer} — ABNORMAL HIGH

## 2017-11-25 ENCOUNTER — Ambulatory Visit (INDEPENDENT_AMBULATORY_CARE_PROVIDER_SITE_OTHER): Payer: Medicaid Other | Admitting: Infectious Diseases

## 2017-11-25 ENCOUNTER — Encounter: Payer: Self-pay | Admitting: Infectious Diseases

## 2017-11-25 VITALS — BP 134/85 | HR 99 | Wt 112.0 lb

## 2017-11-25 DIAGNOSIS — E441 Mild protein-calorie malnutrition: Secondary | ICD-10-CM

## 2017-11-25 DIAGNOSIS — B20 Human immunodeficiency virus [HIV] disease: Secondary | ICD-10-CM | POA: Diagnosis not present

## 2017-11-25 DIAGNOSIS — Z23 Encounter for immunization: Secondary | ICD-10-CM

## 2017-11-25 DIAGNOSIS — Z0000 Encounter for general adult medical examination without abnormal findings: Secondary | ICD-10-CM

## 2017-11-25 DIAGNOSIS — J302 Other seasonal allergic rhinitis: Secondary | ICD-10-CM | POA: Diagnosis not present

## 2017-11-25 DIAGNOSIS — Z8619 Personal history of other infectious and parasitic diseases: Secondary | ICD-10-CM | POA: Diagnosis not present

## 2017-11-25 MED ORDER — FLUTICASONE PROPIONATE 50 MCG/ACT NA SUSP: 1.0000 | Freq: Every day | NASAL | 2 refills | Status: DC

## 2017-11-25 NOTE — Assessment & Plan Note (Signed)
Recheck RPR next visit to follow titer.

## 2017-11-25 NOTE — Patient Instructions (Addendum)
Continue both the Biktarvy and your Bactrim every day.   You look GREAT!   Please come back in 3 months with labs 2 weeks before your visit   I sent in a prescription for nose spray for your allergies

## 2017-11-25 NOTE — Assessment & Plan Note (Signed)
Wt Readings from Last 3 Encounters:  11/25/17 112 lb (50.8 kg)  09/27/17 115 lb (52.2 kg)  08/03/17 96 lb (43.5 kg)   He has regained a good amount of weight and has a much improved appetite. Advised to continue these good nutritional habits while he is on his own.

## 2017-11-25 NOTE — Assessment & Plan Note (Signed)
Excellent viral suppression on Biktarvy. His immune system is also reconstituting well. For simplicity I will have him continue Bactrim until we see each other again in addition to the [place]. Will recheck CD4 and VL today and have him return in 3 months with labs prior to.

## 2017-11-25 NOTE — Assessment & Plan Note (Signed)
Will send in Rx for Flonase for him.

## 2017-11-25 NOTE — Assessment & Plan Note (Signed)
Menveo #1 today. Will administer second dose at next visit. Counseled on HPV vaccines today and will consider this series at upcoming appointments.

## 2017-11-25 NOTE — Progress Notes (Signed)
TRAVIOUS VANOVER November 26, 1990 [id number] PCP: Eulah Pont, MD   Chief Complaint  Patient presents with  . Follow-up   Patient Active Problem List   Diagnosis Date Noted  . Cognitive developmental delay 10/01/2017  . History of syphilis 08/26/2017  . Anemia 08/02/2017  . Healthcare maintenance 08/02/2017  . HIV (human immunodeficiency virus infection) (HCC) 07/26/2017  . External hemorrhoid 07/22/2017  . Atopic dermatitis 03/31/2017  . Seasonal allergic rhinitis 03/31/2017   Subjective: Brief Narrative:  27 y.o. AA male with HIV infection. Diagnosed in November 2018 with CD4 nadir < 10, VL 192,000 copies. HIV Risk: MSM. OI Hx: none   Previous Regimens:   Biktarvy --> suppressed    Genotype:   07/2017 - K103S (R-nevirapine)  HPI:  Augusta returns today for follow up with his mother. He is feeling very well today and only complaint is nasal congestion and rhinorrhea due to allergies and requesting rx spray for this. He has continued taking his "little red and big white" pills once daily and has not missed a single dose. He is feeling much better and reports excellent sleep, appetite and regained weight nicely. He is even in an apartment now living with his sister. No recent sexual encounters or new partners. Reports no complaints today suggestive of associated opportunistic infection or advancing HIV disease such as fevers, night sweats, weight loss, anorexia, cough, SOB, nausea, vomiting, diarrhea, headache, sensory changes, lymphadenopathy or oral thrush.   Review of Systems  Constitutional: Negative for chills, fever, malaise/fatigue and weight loss.  HENT: Positive for congestion. Negative for sore throat.   Eyes: Negative for double vision.  Respiratory: Negative for cough, sputum production and shortness of breath.   Cardiovascular: Negative.   Gastrointestinal: Negative for abdominal pain, diarrhea and vomiting.  Genitourinary: Negative.   Musculoskeletal: Negative  for joint pain, myalgias and neck pain.  Skin: Negative for rash.  Neurological: Negative for headaches.  Psychiatric/Behavioral: Negative for depression and substance abuse. The patient is not nervous/anxious.     Past Medical History:  Diagnosis Date  . HIV (human immunodeficiency virus infection) (HCC)     Social History   Tobacco Use  . Smoking status: Light Tobacco Smoker    Packs/day: 0.10    Types: Cigarettes  . Smokeless tobacco: Never Used  Substance Use Topics  . Alcohol use: No    Comment: occ  . Drug use: Yes    Types: Marijuana    Family History  Problem Relation Age of Onset  . Cancer Mother   . Hypertension Mother     No Known Allergies   Objective: Vitals:   11/25/17 1348  BP: 134/85  Pulse: 99   Physical Exam  Constitutional: He is oriented to person, place, and time and well-developed, well-nourished, and in no distress.  HENT:  Mouth/Throat: Oropharynx is clear and moist. No oral lesions. Normal dentition. No dental caries.  Eyes: Pupils are equal, round, and reactive to light. No scleral icterus.  Cardiovascular: Normal rate, regular rhythm and normal heart sounds.  Pulmonary/Chest: Effort normal and breath sounds normal.  Abdominal: Soft. He exhibits no distension. There is no tenderness.  Lymphadenopathy:    He has no cervical adenopathy.  Neurological: He is alert and oriented to person, place, and time.  Skin: Skin is warm and dry. No rash noted.  Psychiatric: Mood and affect normal.   Lab Results Lab Results  Component Value Date   WBC 4.4 08/02/2017   HGB 8.2 (L) 08/02/2017  HCT 26.2 (L) 08/02/2017   MCV 81.9 08/02/2017   PLT 156 08/02/2017    Lab Results  Component Value Date   CREATININE 0.88 08/02/2017   BUN 17 08/02/2017   NA 142 08/02/2017   K 4.2 08/02/2017   CL 106 08/02/2017   CO2 30 08/02/2017    Lab Results  Component Value Date   ALT 9 08/02/2017   AST 15 08/02/2017   BILITOT 0.3 08/02/2017    HIV 1 RNA  Quant  Date Value  09/27/2017 28 copies/mL (H)  08/16/2017 619 copies/mL (H)  08/02/2017 192,000 Copies/mL (H)   CD4 T Cell Abs (/uL)  Date Value  09/27/2017 190 (L)  08/16/2017 640  08/02/2017 <10 (L)   Lab Results  Component Value Date   HEPBSAG NON-REACTIVE 08/02/2017   HEPBSAB REACTIVE (A) 08/02/2017   Assessment and Plan: Problem List Items Addressed This Visit      Respiratory   Seasonal allergic rhinitis    Will send in Rx for Flonase for him.        Other   RESOLVED: Protein-calorie malnutrition (HCC)    Wt Readings from Last 3 Encounters:  11/25/17 112 lb (50.8 kg)  09/27/17 115 lb (52.2 kg)  08/03/17 96 lb (43.5 kg)   He has regained a good amount of weight and has a much improved appetite. Advised to continue these good nutritional habits while he is on his own.       HIV (human immunodeficiency virus infection) (HCC) - Primary (Chronic)    Excellent viral suppression on Biktarvy. His immune system is also reconstituting well. For simplicity I will have him continue Bactrim until we see each other again in addition to the [place]. Will recheck CD4 and VL today and have him return in 3 months with labs prior to.       Relevant Orders   HIV 1 RNA quant-no reflex-bld   T-helper cell (CD4)- ([facility] only)   HIV 1 RNA quant-no reflex-bld   T-helper cell (CD4)- ([facility] clinic only)   History of syphilis    Recheck RPR next visit to follow titer.       Relevant Orders   RPR   Healthcare maintenance    Menveo #1 today. Will administer second dose at next visit. Counseled on HPV vaccines today and will consider this series at upcoming appointments.         Rexene Alberts, MSN, NP-C [facility]  11/25/2017 2:19 PM

## 2017-11-26 LAB — T-HELPER CELL (CD4) - (RCID CLINIC ONLY)
CD4 % Helper T Cell: 24 % — ABNORMAL LOW (ref 33–55)
CD4 T Cell Abs: 140 /uL — ABNORMAL LOW (ref 400–2700)

## 2017-11-27 LAB — HIV-1 RNA QUANT-NO REFLEX-BLD
HIV 1 RNA Quant: <20 copies/mL — AB
HIV-1 RNA QUANT, LOG: DETECTED {Log_copies}/mL — AB

## 2017-12-06 MED FILL — BIKTARVY 50-200-25 MG TABS: 50-200-25 | 30 days supply | Qty: 30 | Fill #4

## 2017-12-06 MED FILL — SULFAMETHOXAZOLE-TMP DS TAB: 800-160 | 30 days supply | Qty: 30 | Fill #1

## 2018-01-05 MED FILL — BIKTARVY 50-200-25 MG TABS: 50-200-25 | 30 days supply | Qty: 30 | Fill #5

## 2018-01-05 MED FILL — SULFAMETHOXAZOLE-TMP DS TAB: 800-160 | 30 days supply | Qty: 30 | Fill #2

## 2018-01-28 MED FILL — SULFAMETHOXAZOLE-TMP DS TAB: 800-160 | 30 days supply | Qty: 30 | Fill #3

## 2018-01-28 MED FILL — BIKTARVY 50-200-25 MG TABS: 50-200-25 | 30 days supply | Qty: 30 | Fill #6

## 2018-02-18 ENCOUNTER — Other Ambulatory Visit: Payer: Self-pay | Admitting: Pharmacist

## 2018-02-18 NOTE — Progress Notes (Signed)
Patient's mother states he is doing well on Biktarvy and not having any issues, side effects, or missed doses.  Reminded her about his upcoming appointments.

## 2018-02-24 MED FILL — BIKTARVY 50-200-25 MG TABS: 50-200-25 | 30 days supply | Qty: 30 | Fill #7

## 2018-02-24 MED FILL — SULFAMETHOXAZOLE-TMP DS TAB: 800-160 | 30 days supply | Qty: 30 | Fill #4

## 2018-02-28 ENCOUNTER — Other Ambulatory Visit: Payer: Medicaid Other

## 2018-02-28 DIAGNOSIS — Z8619 Personal history of other infectious and parasitic diseases: Secondary | ICD-10-CM

## 2018-02-28 DIAGNOSIS — B20 Human immunodeficiency virus [HIV] disease: Secondary | ICD-10-CM

## 2018-03-01 LAB — RPR TITER: RPR Titer: 1:8 {titer} — ABNORMAL HIGH

## 2018-03-01 LAB — T-HELPER CELL (CD4) - (RCID CLINIC ONLY)
CD4 T CELL ABS: 180 /uL — AB (ref 400–2700)
CD4 T CELL HELPER: 21 % — AB (ref 33–55)

## 2018-03-01 LAB — FLUORESCENT TREPONEMAL AB(FTA)-IGG-BLD: FLUORESCENT TREPONEMAL ABS: REACTIVE — AB

## 2018-03-01 LAB — RPR: RPR Ser Ql: REACTIVE — AB

## 2018-03-02 LAB — HIV-1 RNA QUANT-NO REFLEX-BLD
HIV 1 RNA QUANT: 209 {copies}/mL — AB
HIV-1 RNA QUANT, LOG: 2.32 {Log_copies}/mL — AB

## 2018-03-14 ENCOUNTER — Ambulatory Visit (INDEPENDENT_AMBULATORY_CARE_PROVIDER_SITE_OTHER): Payer: Medicaid Other | Admitting: Infectious Diseases

## 2018-03-14 ENCOUNTER — Encounter: Payer: Self-pay | Admitting: Infectious Diseases

## 2018-03-14 VITALS — BP 125/73 | HR 78 | Temp 97.6°F | Resp 16 | Ht 63.0 in | Wt 109.0 lb

## 2018-03-14 DIAGNOSIS — Z79899 Other long term (current) drug therapy: Secondary | ICD-10-CM

## 2018-03-14 DIAGNOSIS — Z720 Tobacco use: Secondary | ICD-10-CM

## 2018-03-14 DIAGNOSIS — Z0000 Encounter for general adult medical examination without abnormal findings: Secondary | ICD-10-CM

## 2018-03-14 DIAGNOSIS — Z21 Asymptomatic human immunodeficiency virus [HIV] infection status: Secondary | ICD-10-CM

## 2018-03-14 DIAGNOSIS — Z23 Encounter for immunization: Secondary | ICD-10-CM

## 2018-03-14 DIAGNOSIS — R0981 Nasal congestion: Secondary | ICD-10-CM

## 2018-03-14 NOTE — Assessment & Plan Note (Signed)
He was previously undetectable but now viremic with VL > 200. I think much of this is due to the fact that he fell out of habit with pill tray. I have reinforced need for daily medication adherence. I have asked Kelby FamManuel to meet with him for ongoing support. We have provided him with another pill tray and asked him to use it. I would like for him to continue with his Biktarvy and Bactrim daily. Discussed U=U concept in addition to safe sex counseling and prevention of other STIs--> I also provided information to him today about getting male friend/partner in for PrEP and asked him to call Pharmacy team for appointment. I welcomed him to bring her to upcoming appointment to discuss more in person. He will follow up with me again in 2 months with labs prior to visit.

## 2018-03-14 NOTE — Patient Instructions (Addendum)
PrEP - this is a medication your girlfriend can take every day to prevent getting HIV virus. We can help get her this medication. Please have her call our pharmacy team at [phone] to set an appointment.   The BEST way to prevent transferring HIV virus is for you to take your medication everyday.   Please start using the pill tray for your Biktarvy (red pill) and the Bactrim (white pill).   Please come back in 2 months to check in.

## 2018-03-14 NOTE — Progress Notes (Signed)
Aaron Lee 11/10/1990 [id number] PCP: Aaron PontBlum, Nina, MD   Chief Complaint  Patient presents with  . HIV Positive/AIDS   Patient Active Problem List   Diagnosis Date Noted  . Cognitive developmental delay 10/01/2017  . History of syphilis 08/26/2017  . Anemia 08/02/2017  . Healthcare maintenance 08/02/2017  . HIV (human immunodeficiency virus infection) (HCC) 07/26/2017  . Atopic dermatitis 03/31/2017  . Seasonal allergic rhinitis 03/31/2017   Subjective: Brief Narrative:  27 y.o. AA male with HIV infection. Diagnosed in November 2018 with CD4 nadir < 10, VL 192,000 copies. HIV Risk: MSM. OI Hx: none   Previous Regimens:   Biktarvy --> suppressed   Genotype:   07/2017 - K103S (R-nevirapine)  HPI:   Aaron Lee returns today for follow up on his HIV disease. He is feeling very well today. Would like to discuss measures his male friend can take to prevent acquiring HIV infection from him as she would like to start sexual relationship and eventually achieve conception. He is still living in an apartment. Has not been using his pill tray as he had in the past and now only takes medications from the bottle "daily" although he acknowledges he misses doses with this practice. Cannot estimate how many doses he has missed since our last meeting. He has not had any new sexual partners since our last visit. Reports no complaints today suggestive of associated opportunistic infection or advancing HIV disease such as fevers, night sweats, weight loss, anorexia, cough, SOB, nausea, vomiting, diarrhea, headache, sensory changes, lymphadenopathy or oral thrush.    He has not yet received menveo or HPV vaccines. Pneumovax provided at entry to care 09/27/17.   Review of Systems  Constitutional: Negative for chills, fever, malaise/fatigue and weight loss.  HENT: Positive for congestion. Negative for sore throat.   Eyes: Negative for double vision.  Respiratory: Negative for cough, sputum  production and shortness of breath.   Cardiovascular: Negative.   Gastrointestinal: Negative for abdominal pain, diarrhea and vomiting.  Genitourinary: Negative.   Musculoskeletal: Negative for joint pain, myalgias and neck pain.  Skin: Negative for rash.  Neurological: Negative for headaches.  Psychiatric/Behavioral: Negative for depression and substance abuse. The patient is not nervous/anxious.     Past Medical History:  Diagnosis Date  . HIV (human immunodeficiency virus infection) (HCC)     Social History   Tobacco Use  . Smoking status: Light Tobacco Smoker    Packs/day: 0.10    Types: Cigarettes  . Smokeless tobacco: Never Used  Substance Use Topics  . Alcohol use: No    Comment: occ  . Drug use: Yes    Types: Marijuana    Family History  Problem Relation Age of Onset  . Cancer Mother   . Hypertension Mother     No Known Allergies   Objective: Vitals:   03/14/18 1345  BP: 125/73  Pulse: 78  Resp: 16  Temp: 97.6 F (36.4 C)  SpO2: 100%   Physical Exam  Constitutional: He is oriented to person, place, and time and well-developed, well-nourished, and in no distress.  HENT:  Mouth/Throat: Oropharynx is clear and moist. No oral lesions. Normal dentition. No dental caries.  Eyes: Pupils are equal, round, and reactive to light. No scleral icterus.  Cardiovascular: Normal rate, regular rhythm and normal heart sounds.  Pulmonary/Chest: Effort normal and breath sounds normal.  Abdominal: Soft. He exhibits no distension. There is no tenderness.  Lymphadenopathy:    He has no cervical adenopathy.  Neurological: He is alert and oriented to person, place, and time.  Skin: Skin is warm and dry. No rash noted.  Psychiatric: Mood and affect normal.   Lab Results Lab Results  Component Value Date   WBC 4.4 08/02/2017   HGB 8.2 (L) 08/02/2017   HCT 26.2 (L) 08/02/2017   MCV 81.9 08/02/2017   PLT 156 08/02/2017    Lab Results  Component Value Date    CREATININE 0.88 08/02/2017   BUN 17 08/02/2017   NA 142 08/02/2017   K 4.2 08/02/2017   CL 106 08/02/2017   CO2 30 08/02/2017    Lab Results  Component Value Date   ALT 9 08/02/2017   AST 15 08/02/2017   BILITOT 0.3 08/02/2017    HIV 1 RNA Quant (copies/mL)  Date Value  02/28/2018 209 (H)  11/25/2017 <20 DETECTED (A)  09/27/2017 28 (H)   CD4 T Cell Abs (/uL)  Date Value  02/28/2018 180 (L)  11/25/2017 140 (L)  09/27/2017 190 (L)   Lab Results  Component Value Date   HEPBSAG NON-REACTIVE 08/02/2017   HEPBSAB REACTIVE (A) 08/02/2017   Assessment and Plan: Problem List Items Addressed This Visit      Other   HIV (human immunodeficiency virus infection) (HCC) - Primary (Chronic)    He was previously undetectable but now viremic with VL > 200. I think much of this is due to the fact that he fell out of habit with pill tray. I have reinforced need for daily medication adherence. I have asked Aaron Lee to meet with him for ongoing support. We have provided him with another pill tray and asked him to use it. I would like for him to continue with his Biktarvy and Bactrim daily. Discussed U=U concept in addition to safe sex counseling and prevention of other STIs--> I also provided information to him today about getting male friend/partner in for PrEP and asked him to call Pharmacy team for appointment. I welcomed him to bring her to upcoming appointment to discuss more in person. He will follow up with me again in 2 months with labs prior to visit.       Relevant Orders   MENINGOCOCCAL MCV4O(MENVEO) (Completed)   HPV vaccine bivalent 3 dose IM (Completed)   Healthcare maintenance    Will get him second Menvo today and HPV#1. Back in 2 months for check in and HPV #2 as well.  Prevnar due 09/27/2018. He declined condoms today.        Other Visit Diagnoses    Need for meningococcus vaccine       Relevant Orders   MENINGOCOCCAL MCV4O(MENVEO) (Completed)   Need for HPV vaccination        Relevant Orders   HPV vaccine bivalent 3 dose IM (Completed)     I spent greater than 25 minutes with the patient including greater than 50% of time in face to face counsel of the patient re HIV, U=U, importance of adherence, pre-conception counseling, PrEP, vaccine counseling and in coordination of their care.  Rexene Alberts, MSN, NP-C [facility]  03/14/2018

## 2018-03-14 NOTE — Assessment & Plan Note (Addendum)
Will get him second Menvo today and HPV#1. Back in 2 months for check in and HPV #2 as well.  Prevnar due 09/27/2018. He declined condoms today.

## 2018-03-22 MED FILL — SULFAMETHOXAZOLE-TMP DS TAB: 800-160 | 30 days supply | Qty: 30 | Fill #5

## 2018-03-22 MED FILL — BIKTARVY 50-200-25 MG TABS: 50-200-25 | 30 days supply | Qty: 30 | Fill #8

## 2018-04-08 ENCOUNTER — Emergency Department (HOSPITAL_COMMUNITY)
Admission: EM | Admit: 2018-04-08 | Discharge: 2018-04-08 | Disposition: A | Discharge status: Home / Self Care | Payer: Medicaid Other | Attending: Emergency Medicine | Admitting: Emergency Medicine

## 2018-04-08 ENCOUNTER — Encounter (HOSPITAL_COMMUNITY): Payer: Self-pay | Admitting: Emergency Medicine

## 2018-04-08 ENCOUNTER — Emergency Department (HOSPITAL_COMMUNITY): Payer: Medicaid Other

## 2018-04-08 DIAGNOSIS — S62307A Unspecified fracture of fifth metacarpal bone, left hand, initial encounter for closed fracture: Secondary | ICD-10-CM | POA: Diagnosis not present

## 2018-04-08 DIAGNOSIS — Y929 Unspecified place or not applicable: Secondary | ICD-10-CM | POA: Diagnosis not present

## 2018-04-08 DIAGNOSIS — S62397A Other fracture of fifth metacarpal bone, left hand, initial encounter for closed fracture: Secondary | ICD-10-CM | POA: Diagnosis not present

## 2018-04-08 DIAGNOSIS — Y939 Activity, unspecified: Secondary | ICD-10-CM | POA: Insufficient documentation

## 2018-04-08 DIAGNOSIS — Y999 Unspecified external cause status: Secondary | ICD-10-CM | POA: Insufficient documentation

## 2018-04-08 DIAGNOSIS — W2209XA Striking against other stationary object, initial encounter: Secondary | ICD-10-CM | POA: Diagnosis not present

## 2018-04-08 DIAGNOSIS — F17210 Nicotine dependence, cigarettes, uncomplicated: Secondary | ICD-10-CM | POA: Insufficient documentation

## 2018-04-08 DIAGNOSIS — S62317A Displaced fracture of base of fifth metacarpal bone. left hand, initial encounter for closed fracture: Secondary | ICD-10-CM | POA: Diagnosis not present

## 2018-04-08 DIAGNOSIS — Z79899 Other long term (current) drug therapy: Secondary | ICD-10-CM | POA: Diagnosis not present

## 2018-04-08 DIAGNOSIS — S6992XA Unspecified injury of left wrist, hand and finger(s), initial encounter: Secondary | ICD-10-CM | POA: Diagnosis present

## 2018-04-08 MED ORDER — HYDROCODONE-ACETAMINOPHEN 5-325 MG PO TABS: 1.0000 | ORAL_TABLET | Freq: Two times a day (BID) | ORAL | 0 refills | Status: DC | PRN

## 2018-04-08 NOTE — ED Notes (Signed)
Patient transported to X-ray 

## 2018-04-08 NOTE — ED Notes (Signed)
Pt given water 

## 2018-04-08 NOTE — Discharge Instructions (Addendum)
You may alternate taking Tylenol and Ibuprofen as needed for pain control. You may take 400-600 mg of ibuprofen every 6 hours and [phone] mg of Tylenol every 6 hours. Do not exceed 4000 mg of Tylenol daily as this can lead to liver damage. Also, make sure to take Ibuprofen with meals as it can cause an upset stomach. Do not take other NSAIDs while taking Ibuprofen such as (Aleve, Naprosyn, Aspirin, Celebrex, etc) and do not take more than the prescribed dose as this can lead to ulcers and bleeding in your GI tract. You may use warm and cold compresses to help with your symptoms.   Prescription given for Norco. Take medication as directed and do not operate machinery, drive a car, or work while taking this medication as it can make you drowsy.   Please follow up with Dr. Amanda PeaGramig. Call the office on Monday 04/11/18 to make an appointment.   Please return to the ER sooner if you have any new or worsening symptoms.

## 2018-04-08 NOTE — ED Notes (Signed)
Ortho paged. 

## 2018-04-08 NOTE — ED Notes (Signed)
Pt back from x-ray.

## 2018-04-08 NOTE — ED Notes (Signed)
Pt verbalized understanding discharge instructions and denies any further needs or questions at this time. VS stable, ambulatory and steady gait.   
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
## 2018-04-11 DIAGNOSIS — S62336A Displaced fracture of neck of fifth metacarpal bone, right hand, initial encounter for closed fracture: Secondary | ICD-10-CM | POA: Diagnosis not present

## 2018-04-11 DIAGNOSIS — M79642 Pain in left hand: Secondary | ICD-10-CM | POA: Insufficient documentation

## 2018-04-13 ENCOUNTER — Other Ambulatory Visit: Payer: Self-pay | Admitting: Pharmacist

## 2018-04-13 DIAGNOSIS — B20 Human immunodeficiency virus [HIV] disease: Secondary | ICD-10-CM

## 2018-04-18 MED FILL — BIKTARVY 50-200-25 MG TABS: 50-200-25 | 30 days supply | Qty: 30 | Fill #9

## 2018-04-18 MED FILL — SULFAMETHOXAZOLE-TMP DS TAB: 800-160 | 30 days supply | Qty: 30 | Fill #0

## 2018-05-05 DIAGNOSIS — S62308A Unspecified fracture of other metacarpal bone, initial encounter for closed fracture: Secondary | ICD-10-CM | POA: Insufficient documentation

## 2018-05-05 DIAGNOSIS — S62347D Nondisplaced fracture of base of fifth metacarpal bone. left hand, subsequent encounter for fracture with routine healing: Secondary | ICD-10-CM | POA: Diagnosis not present

## 2018-05-05 DIAGNOSIS — M79642 Pain in left hand: Secondary | ICD-10-CM | POA: Diagnosis not present

## 2018-05-16 ENCOUNTER — Encounter: Payer: Self-pay | Admitting: Infectious Diseases

## 2018-05-16 ENCOUNTER — Ambulatory Visit (INDEPENDENT_AMBULATORY_CARE_PROVIDER_SITE_OTHER): Payer: Medicaid Other | Admitting: Infectious Diseases

## 2018-05-16 VITALS — BP 132/87 | HR 80 | Temp 98.4°F | Wt 106.0 lb

## 2018-05-16 DIAGNOSIS — Z0000 Encounter for general adult medical examination without abnormal findings: Secondary | ICD-10-CM | POA: Diagnosis not present

## 2018-05-16 DIAGNOSIS — Z23 Encounter for immunization: Secondary | ICD-10-CM

## 2018-05-16 DIAGNOSIS — Z8619 Personal history of other infectious and parasitic diseases: Secondary | ICD-10-CM | POA: Diagnosis not present

## 2018-05-16 DIAGNOSIS — B20 Human immunodeficiency virus [HIV] disease: Secondary | ICD-10-CM

## 2018-05-16 MED FILL — SULFAMETHOXAZOLE-TMP DS TAB: 800-160 | 30 days supply | Qty: 30 | Fill #1

## 2018-05-16 MED FILL — BIKTARVY 50-200-25 MG TABS: 50-200-25 | 30 days supply | Qty: 30 | Fill #10

## 2018-05-16 NOTE — Assessment & Plan Note (Signed)
Previously undetectable on Biktarvy but having trouble with adherence. Reviewed his VL's and educated on preventing resistance with daily medication adherence. No signs of OI's today. Check VL/CD4. Kelby FamManuel saw him and reinforced the above. He is open to working with Molli PoseyAmbre - will place referral to assist him with adherence. Discussed recommended follow up for his GF on PrEP and advised to schedule appointment.  Needs to continue Bacrim and Biktarvy daily.  Return in 2 months --> if adherence still spotty [facility].

## 2018-05-16 NOTE — Assessment & Plan Note (Signed)
HPV #2 today. Final in December to complete.  Prevnar due 09/27/18

## 2018-05-16 NOTE — Progress Notes (Signed)
Name: Aaron Lee  DOB: July 27, 1991  MRN: [id number]  PCP: Eulah Pont, MD   Patient Active Problem List   Diagnosis Date Noted  . Cognitive developmental delay 10/01/2017  . History of syphilis 08/26/2017  . Anemia 08/02/2017  . Healthcare maintenance 08/02/2017  . AIDS (acquired immune deficiency syndrome) (HCC) 07/26/2017  . Atopic dermatitis 03/31/2017  . Seasonal allergic rhinitis 03/31/2017   SUBJECTIVE: Brief Narrative:  Aaron Lee is a 27 y.o. AA male with HIV diagnosed in November 2018 with CD4 nadir < 10, VL 192,000 copies. HIV Risk: bisexual. OI Hx: none    Previous Regimens:   Biktarvy --> suppressed   Genotype:   07/2017 - K103S (R-nevirapine)  Chief Complaint  Patient presents with  . Follow-up    HIV     HPI/ROS:  Esker returns today for follow up on his HIV disease. He tells me he "feels like he does not have HIV he feels so good." Due to this he does feel like he misses doses of Biktarvy. He estimates approx 2 missed doses over 14d period. This applies for his Bactrim as well. Sets up pill tray sometimes. He has an alarm on his phone but sometimes he just ignores it. Has a girlfriend that is taking PrEP once a day, asking about refills for this. Reports no complaints today suggestive of associated opportunistic infection or advancing HIV disease such as fevers, night sweats, weight loss, anorexia, cough, SOB, nausea, vomiting, diarrhea, headache, sensory changes, lymphadenopathy or oral thrush.   Would like all recommended vaccines.    Review of Systems  Constitutional: Negative for chills, fever, malaise/fatigue and weight loss.  HENT: Negative for congestion and sore throat.   Eyes: Negative for double vision.  Respiratory: Negative for cough, sputum production and shortness of breath.   Cardiovascular: Negative.   Gastrointestinal: Negative for abdominal pain, diarrhea and vomiting.  Genitourinary: Negative.   Musculoskeletal:  Negative for joint pain, myalgias and neck pain.  Skin: Negative for rash.  Neurological: Negative for headaches.  Psychiatric/Behavioral: Negative for depression and substance abuse. The patient is not nervous/anxious.     Past Medical History:  Diagnosis Date  . HIV (human immunodeficiency virus infection) (HCC)     Social History   Tobacco Use  . Smoking status: Light Tobacco Smoker    Packs/day: 0.10    Types: Cigarettes  . Smokeless tobacco: Never Used  Substance Use Topics  . Alcohol use: No    Comment: occ  . Drug use: Yes    Types: Marijuana    Family History  Problem Relation Age of Onset  . Cancer Mother   . Hypertension Mother     No Known Allergies   Objective: Vitals:   05/16/18 1116  BP: 132/87  Pulse: 80  Temp: 98.4 F (36.9 C)   Physical Exam  Constitutional: He is oriented to person, place, and time and well-developed, well-nourished, and in no distress.  Seated comfortably today. Mental delay noted.   HENT:  Mouth/Throat: Oropharynx is clear and moist. No oral lesions. Normal dentition. No dental caries.  Eyes: Pupils are equal, round, and reactive to light. No scleral icterus.  Cardiovascular: Normal rate, regular rhythm and normal heart sounds.  Pulmonary/Chest: Effort normal and breath sounds normal.  Abdominal: Soft. He exhibits no distension. There is no tenderness.  Lymphadenopathy:    He has no cervical adenopathy.  Neurological: He is alert and oriented to person, place, and time.  Skin: Skin is warm  and dry. No rash noted.  Psychiatric: Mood and affect normal.  Vitals reviewed.  Lab Results Lab Results  Component Value Date   WBC 4.4 08/02/2017   HGB 8.2 (L) 08/02/2017   HCT 26.2 (L) 08/02/2017   MCV 81.9 08/02/2017   PLT 156 08/02/2017    Lab Results  Component Value Date   CREATININE 0.88 08/02/2017   BUN 17 08/02/2017   NA 142 08/02/2017   K 4.2 08/02/2017   CL 106 08/02/2017   CO2 30 08/02/2017    Lab Results    Component Value Date   ALT 9 08/02/2017   AST 15 08/02/2017   BILITOT 0.3 08/02/2017    HIV 1 RNA Quant (copies/mL)  Date Value  02/28/2018 209 (H)  11/25/2017 <20 DETECTED (A)  09/27/2017 28 (H)   CD4 T Cell Abs (/uL)  Date Value  02/28/2018 180 (L)  11/25/2017 140 (L)  09/27/2017 190 (L)    Assessment and Plan: Problem List Items Addressed This Visit      Other   History of syphilis    No sx indicating recurrent or re-infection. Recheck titer in 2 months. Discussed safe sex/condoms.       Healthcare maintenance    HPV #2 today. Final in December to complete.  Prevnar due 09/27/18      AIDS (acquired immune deficiency syndrome) (HCC) - Primary    Previously undetectable on Biktarvy but having trouble with adherence. Reviewed his VL's and educated on preventing resistance with daily medication adherence. No signs of OI's today. Check VL/CD4. Kelby FamManuel saw him and reinforced the above. He is open to working with Molli PoseyAmbre - will place referral to assist him with adherence. Discussed recommended follow up for his GF on PrEP and advised to schedule appointment.  Needs to continue Bacrim and Biktarvy daily.  Return in 2 months --> if adherence still spotty will change [facility] for higher barrier.       Relevant Orders   Ambulatory Referral for Home Visit   COMPLETE METABOLIC PANEL WITH GFR   T-helper cell (CD4)- ([facility] clinic only)   HIV 1 RNA quant-no reflex-bld   HPV 9-valent vaccine,Recombinat (Completed)    Other Visit Diagnoses    Need for HPV vaccination       Relevant Orders   HPV 9-valent vaccine,Recombinat (Completed)      No orders of the defined types were placed in this encounter.  Return in about 2 months (around 07/16/2018).   I spent greater than 25 minutes with the patient including greater than 50% of time in face to face counsel of the patient re HIV, U=U, importance of adherence, pre-conception counseling, PrEP, vaccine counseling and in  coordination of their care.  Rexene AlbertsStephanie Dixon, MSN, NP-C [facility]  05/16/2018

## 2018-05-16 NOTE — Patient Instructions (Addendum)
Nice to see you!  Remember to take your biktarvy and bactrim EVERY DAY.   Your girlfriend needs to come back for HIV testing every 3 months while taking PrEP  Please come back to see [name] in 2 months.

## 2018-05-16 NOTE — Assessment & Plan Note (Addendum)
No sx indicating recurrent or re-infection. Recheck titer in 2 months. Discussed safe sex/condoms.

## 2018-05-17 LAB — T-HELPER CELL (CD4) - (RCID CLINIC ONLY)
CD4 % Helper T Cell: 10 % — ABNORMAL LOW (ref 33–55)
CD4 T CELL ABS: 100 /uL — AB (ref 400–2700)

## 2018-05-17 NOTE — Progress Notes (Signed)
CD4 still low at 100 due to intermittent adherence. Referral made to [name]. He needs to continue taking his Bactrim one DS tablet QD in addition to his Biktarvy.

## 2018-05-18 LAB — COMPLETE METABOLIC PANEL WITH GFR
AG RATIO: 1.6 (calc) (ref 1.0–2.5)
ALKALINE PHOSPHATASE (APISO): 55 U/L (ref 40–115)
ALT: 12 U/L (ref 9–46)
AST: 20 U/L (ref 10–40)
Albumin: 4.2 g/dL (ref 3.6–5.1)
BUN: 16 mg/dL (ref 7–25)
CO2: 29 mmol/L (ref 20–32)
Calcium: 8.8 mg/dL (ref 8.6–10.3)
Chloride: 107 mmol/L (ref 98–110)
Creat: 1.18 mg/dL (ref 0.60–1.35)
GFR, EST AFRICAN AMERICAN: 97 mL/min/{1.73_m2} (ref >=60)
GFR, Est Non African American: 84 mL/min/{1.73_m2} (ref >=60)
GLOBULIN: 2.6 g/dL (ref 1.9–3.7)
GLUCOSE: 104 mg/dL — AB (ref 65–99)
POTASSIUM: 3.9 mmol/L (ref 3.5–5.3)
SODIUM: 143 mmol/L (ref 135–146)
TOTAL PROTEIN: 6.8 g/dL (ref 6.1–8.1)
Total Bilirubin: 0.4 mg/dL (ref 0.2–1.2)

## 2018-05-18 LAB — HIV-1 RNA QUANT-NO REFLEX-BLD
HIV 1 RNA Quant: 59400 copies/mL — ABNORMAL HIGH
HIV-1 RNA QUANT, LOG: 4.77 {Log_copies}/mL — AB
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
## 2018-05-19 NOTE — Progress Notes (Signed)
I have never met her but would presume that to be the case. I have the same worries about you and gave Sekou the # to [facility] again to get her here to discuss again.

## 2018-05-20 ENCOUNTER — Ambulatory Visit: Payer: Self-pay | Admitting: *Deleted

## 2018-05-20 ENCOUNTER — Telehealth: Payer: Self-pay | Admitting: *Deleted

## 2018-05-20 DIAGNOSIS — B20 Human immunodeficiency virus [HIV] disease: Secondary | ICD-10-CM

## 2018-05-30 NOTE — Telephone Encounter (Signed)
Order received on 05/16/18 by Rexene Alberts NP/Dr Hatcher to evaluate patient for [facility]).  1st attempt made on 05/17/18 with evaluation completed on 05/20/18 for [facility]. Patient was consented to care at this time.   Frequency / Duration of [facility] visits: Effective 05/20/18 63mo1, 82mo1, 54mo2 4 PRN's for complications with disease process/progression, medication changes or concerns   [facility] will assess for learning needs related to diagnosis and treatment regimen, provide education as needed, fill pill box if needed, address any barriers which may be preventing medication compliance, and communicating with care team including physician and case manager.   Individualized Plan Of Care with Certification Period effective 05/20/18 to 08/18/18  a. Type of service(s) and care to be delivered: RN Case Management  b. Frequency and duration of service: Effective 05/20/18 72mo1, 54mo1, 37mo2, 4 prns for complications  with disease process/progression, medication changes or  concerns . Visits/Contact may be conducted telephonically or in person to best suit the patient. Home visits will be offered as a 1st choice. Home is safe for visits  c. Activity restrictions: Pt may be up as tolerated and can safely ambulate without the need for a assistive device   d. Safety Measures: Standard Precautions/Infection Control   e. Service Objectives and Goals: Service Objectives are to assist the pt with HIV medication regimen adherence and staying in care with the Infectious Disease Clinic by identifying barriers to care. RN will address the barriers that are identified by the patient. Patient mother is his legal guardian and states she just wants to be sure he is taking his medications. Viremia noted on his last lab draw but he states he is adherent to his medications.   f. Equipment required: No additional equipment needs at this time   g. Functional Limitations: None noted  h.  Rehabilitation potential: Guarded   i. Diet and Nutritional Needs: Regular Diet   j. Medications and treatments: Medications have been reconciled and reviewed and are a part of EPIC electronic file   k. Specific therapies if needed: RN   l. Pertinent diagnoses: HIV disease,  Hx of medication NonCompliance, Patient has a legal guardian   m. Expected outcome: Guarded

## 2018-05-31 ENCOUNTER — Ambulatory Visit: Payer: Self-pay | Admitting: *Deleted

## 2018-05-31 DIAGNOSIS — B20 Human immunodeficiency virus [HIV] disease: Secondary | ICD-10-CM

## 2018-05-31 DIAGNOSIS — Z21 Asymptomatic human immunodeficiency virus [HIV] infection status: Secondary | ICD-10-CM

## 2018-05-31 NOTE — Telephone Encounter (Signed)
I have reviewed and approve Ms McNeil's plan of care for this patient.   

## 2018-06-01 ENCOUNTER — Other Ambulatory Visit: Payer: Self-pay | Admitting: *Deleted

## 2018-06-01 DIAGNOSIS — B999 Unspecified infectious disease: Secondary | ICD-10-CM

## 2018-06-01 MED ORDER — HYDROCORTISONE 2.5 % EX CREA: TOPICAL_CREAM | Freq: Two times a day (BID) | CUTANEOUS | 3 refills | Status: DC

## 2018-06-01 MED ORDER — HYDROCORTISONE 2.5 % EX CREA: TOPICAL_CREAM | Freq: Two times a day (BID) | CUTANEOUS | 0 refills | Status: DC

## 2018-06-01 MED FILL — HYDROCORTISONE 2.5% CREAM: 2.5 | 15 days supply | Qty: 30 | Fill #0

## 2018-06-09 NOTE — Progress Notes (Signed)
   CHIEF COMPLAINT:   Chief Complaint  Patient presents with  . Initial Home Visit    Subjective  Aaron Lee stated he has been doing pretty good with taking his medications but is in agreement with having me come and offer assistance. Aaron Lee states he received a postive HIV test in 2018 and doesn't like to take medications but he does. He states that he does not have any side effects with the medications and takes them as ordered. Aaron Lee has the medications on hand.     Objective Education provided on the importance of medication adherence. At this time we pulled up last lab results electronically and I had Aaron Lee read to me the results.(59,400). Explained to Aaron Lee that this number should say less than 20 when his medication is working. Aaron Lee and I discussed that when taken the medication worked for him less than 6 months ago. Aaron Lee agreed and opened mailed medications from [facility]. I prefilled 7 days of medications in a pillbox for Aaron Lee. Aaron Lee states his girlfriend is still adherent to her PrEP and they are still in a relationship.   Examination:  General exam: Appears calm and comfortable  Respiratory system: Clear to auscultation. Respiratory effort normal. HEENT:no thrush noted Cardiovascular system: S1 & S2 heard, No pedal edema. Gastrointestinal system: Abdomen is nondistended, Normal bowel sounds heard. Central nervous system: Alert and oriented. N Extremities: Symmetric 5 x 5 power. Skin: Not assessed Psychiatry: Judgement and insight appear normal. Mood & affect appropriate. Graduated High School/[employer]  VITALS: Vitals taken and are within the normal limits  I personally reviewed Labs under Results section. Results for Aaron Lee, Aaron Lee (MRN [id number]) as of 06/09/2018 11:50  Ref. Range 05/16/2018 11:45  HIV 1 RNA Quant Latest Ref Range: NOT DETECT copies/mL 59,400 (H)    Radiology Reports No results found.     Assessment/Plan:  Continue to assess  the patient and his needs  Code Status: Full  Family Communication: Mom is legal guardian  Disposition Plan: Lives independently at home. Home appears safe for home visits   Time spent: 30 minutes  Aaron Lee, Aaron Lee

## 2018-06-09 NOTE — Telephone Encounter (Signed)
Referral received to engage with [name] and assist him with medications adherence. Spoke with his mom and she agreed to a home visit today. Berna SpareMarcus as well agreed to a home visit. His mother is the legal guardian but Berna SpareMarcus lives independently

## 2018-06-09 NOTE — Progress Notes (Signed)
Home visit made with Aaron Lee today to re-evalute his medication adherence. Aaron Lee and I discussed his birthday celebration and he stated things are going well for him. He has prefilled his pillbox and on assessment  has the exact amount of pills that he should have which implies that he is adherent to his medications. Nursing bag left in the car since Aaron Lee made me aware that his neighbors will be sitting outside his apartment and he stated they are "nosey". On physical assessment Aaron Lee is concerned about a fine rash that is present only on is anterior chest. The rash is limited to his anterior chest and does not cover his abdomen. Aaron Lee stated he had some cream in the past that helped. On review of his chart Hydrocortisone cream was prescribed by another office so I f/u with Aaron Lee to see if we can do a refill. Refill approved and order placed for hydrocortisone cream.

## 2018-06-09 NOTE — Patient Instructions (Signed)
RN met with client and spoke with caregiver for assessment. Home Care Providers safetly booklet declined. RN reviewed Transport planner, [place], Home Safety Management Information Booklet. Home Fire Safety Assessment, Fall Risk Assessment and Suicide Risk Assessment was performed. RN also discussed information on a Living Will, Advanced Directives, and [place]. RN and Client/Designated Party educated/reviewed/signed Client Agreement and Consent for Service form along with Patient Rights and Responsibilities statement. RN developed patient specific and centered care plan. RN provided contact information and reviewed how to receive emergency help after hours for schedule changes, billing  questions, reporting of safety issues, falls, concerns or any needs/questions. Standard Precaution and Infection control along with interventions to correct or prevent high risk behaviors instructed to the patient. Client/Caregiver reports understanding and agreement with the above

## 2018-06-13 MED FILL — SULFAMETHOXAZOLE-TMP DS TAB: 800-160 | 30 days supply | Qty: 30 | Fill #2

## 2018-06-13 MED FILL — BIKTARVY 50-200-25 MG TABS: 50-200-25 | 30 days supply | Qty: 30 | Fill #11

## 2018-07-07 ENCOUNTER — Other Ambulatory Visit: Payer: Self-pay | Admitting: Pharmacist

## 2018-07-07 DIAGNOSIS — B20 Human immunodeficiency virus [HIV] disease: Secondary | ICD-10-CM

## 2018-07-11 MED FILL — BIKTARVY 50-200-25 MG TABS: 50-200-25 | 30 days supply | Qty: 30 | Fill #0

## 2018-07-11 MED FILL — SULFAMETHOXAZOLE-TMP DS TAB: 800-160 | 30 days supply | Qty: 30 | Fill #3

## 2018-07-14 ENCOUNTER — Encounter: Payer: Self-pay | Admitting: Infectious Diseases

## 2018-07-14 ENCOUNTER — Ambulatory Visit (INDEPENDENT_AMBULATORY_CARE_PROVIDER_SITE_OTHER): Payer: Medicaid Other | Admitting: Infectious Diseases

## 2018-07-14 VITALS — BP 124/83 | HR 83 | Temp 98.0°F | Wt 106.0 lb

## 2018-07-14 DIAGNOSIS — L2089 Other atopic dermatitis: Secondary | ICD-10-CM

## 2018-07-14 DIAGNOSIS — D638 Anemia in other chronic diseases classified elsewhere: Secondary | ICD-10-CM | POA: Diagnosis not present

## 2018-07-14 DIAGNOSIS — B20 Human immunodeficiency virus [HIV] disease: Secondary | ICD-10-CM

## 2018-07-14 DIAGNOSIS — Z0000 Encounter for general adult medical examination without abnormal findings: Secondary | ICD-10-CM

## 2018-07-14 DIAGNOSIS — Z8619 Personal history of other infectious and parasitic diseases: Secondary | ICD-10-CM

## 2018-07-14 DIAGNOSIS — Z23 Encounter for immunization: Secondary | ICD-10-CM | POA: Diagnosis not present

## 2018-07-14 MED ORDER — SULFAMETHOXAZOLE-TRIMETHOPRIM 800-160 MG PO TABS: 1.0000 | ORAL_TABLET | Freq: Every day | ORAL | 5 refills | Status: DC

## 2018-07-14 MED ORDER — BICTEGRAVIR-EMTRICITAB-TENOFOV 50-200-25 MG PO TABS: 1.0000 | ORAL_TABLET | Freq: Every day | ORAL | 5 refills | Status: DC

## 2018-07-14 NOTE — Progress Notes (Signed)
Name: Aaron Lee  DOB: 01-Apr-1991  MRN: [id number]  PCP: Eulah Pont, MD   Patient Active Problem List   Diagnosis Date Noted  . Cognitive developmental delay 10/01/2017  . History of syphilis 08/26/2017  . Anemia 08/02/2017  . Healthcare maintenance 08/02/2017  . AIDS (acquired immune deficiency syndrome) (HCC) 07/26/2017  . Atopic dermatitis 03/31/2017  . Seasonal allergic rhinitis 03/31/2017   SUBJECTIVE: Aaron Lee is a 27 y.o. AA male with HIV diagnosed in November 2018 with CD4 nadir < 10, VL 192,000 copies. HIV Risk: bisexual. OI Hx: none    Previous Regimens:   Biktarvy --> suppressed   Genotype:   07/2017 - K103S (R-nevirapine)  CC:  Patty is here today for routine follow up on HIV infection. Needs refill on topical steroid for eczema.    HPI/ROS:  Reports he is doing better with taking his Biktarvy and Bactrim once a day. He has really enjoyed [name] coming to check in on him and help him set his pill box. Estimates no missed pills since our last visit when he told me he was only taking it sporadically because he felt so well. He is still in a relationship with his same girlfriend and having sex with condoms. She is supposed to be on PrEP but he is not certain if she is taking it still. He continues to feel well and has no complaints about his health today. Requesting refill on topical steroid cream.   Needs flu vaccine.   Review of Systems  Constitutional: Negative for chills, fever, malaise/fatigue and weight loss.  HENT: Negative for congestion and sore throat.   Eyes: Negative for double vision.  Respiratory: Negative for cough, sputum production and shortness of breath.   Cardiovascular: Negative.   Gastrointestinal: Negative for abdominal pain, diarrhea and vomiting.  Genitourinary: Negative.   Musculoskeletal: Negative for joint pain, myalgias and neck pain.  Skin: Negative for rash.  Neurological: Negative for headaches.    Psychiatric/Behavioral: Negative for depression and substance abuse. The patient is not nervous/anxious.     Past Medical History:  Diagnosis Date  . Eczema   . HIV (human immunodeficiency virus infection) (HCC)     Social History   Tobacco Use  . Smoking status: Light Tobacco Smoker    Packs/day: 0.10    Types: Cigarettes  . Smokeless tobacco: Never Used  Substance Use Topics  . Alcohol use: No    Comment: occ  . Drug use: Yes    Types: Marijuana   He  reports that he currently engages in sexual activity and has had partner(s) who are Male and Male. He reports using the following method of birth control/protection: Condom.  No Known Allergies   Outpatient Medications Prior to Visit  Medication Sig Dispense Refill  . fluticasone (FLONASE) 50 MCG/ACT nasal spray Place 1 spray into both nostrils daily. 16 g 2  . BIKTARVY 50-200-25 MG TABS tablet TAKE 1 TABLET BY MOUTH DAILY. 30 tablet 0  . hydrocortisone 2.5 % cream Apply topically 2 (two) times daily. To anterior chest 30 g 3  . sulfamethoxazole-trimethoprim (BACTRIM DS,SEPTRA DS) 800-160 MG tablet TAKE 1 TABLET BY MOUTH DAILY. 30 tablet 3   No facility-administered medications prior to visit.     Objective: Vitals:   07/14/18 1344  BP: 124/83  Pulse: 83  Temp: 98 F (36.7 C)   Physical Exam  Constitutional: He is oriented to person, place, and time and well-developed, well-nourished, and in no distress.  Seated comfortably today. Delayed   HENT:  Mouth/Throat: Oropharynx is clear and moist. No oral lesions. Normal dentition. No dental caries.  Eyes: Pupils are equal, round, and reactive to light. No scleral icterus.  Cardiovascular: Normal rate, regular rhythm and normal heart sounds.  Pulmonary/Chest: Effort normal and breath sounds normal.  Abdominal: Soft. He exhibits no distension. There is no tenderness.  Lymphadenopathy:    He has no cervical adenopathy.  Neurological: He is alert and oriented to  person, place, and time.  Skin: Skin is warm and dry. No rash noted.  Psychiatric: Mood and affect normal.  Vitals reviewed.  Lab Results Lab Results  Component Value Date   WBC 4.4 08/02/2017   HGB 8.2 (L) 08/02/2017   HCT 26.2 (L) 08/02/2017   MCV 81.9 08/02/2017   PLT 156 08/02/2017    Lab Results  Component Value Date   CREATININE 1.18 05/16/2018   BUN 16 05/16/2018   NA 143 05/16/2018   K 3.9 05/16/2018   CL 107 05/16/2018   CO2 29 05/16/2018    Lab Results  Component Value Date   ALT 12 05/16/2018   AST 20 05/16/2018   BILITOT 0.4 05/16/2018    HIV 1 RNA Quant (copies/mL)  Date Value  05/16/2018 59,400 (H)  02/28/2018 209 (H)  11/25/2017 <20 DETECTED (A)   CD4 T Cell Abs (/uL)  Date Value  05/16/2018 100 (L)  02/28/2018 180 (L)  11/25/2017 140 (L)    Assessment and Plan: Problem List Items Addressed This Visit      Unprioritized   AIDS (acquired immune deficiency syndrome) (HCC) - Primary    Reviewed lab results in depth and showed him how in the setting of poor adherence his HIV gets out of control. He was surprised at this today. Appreciate [name]'s and Manuel's help to keep him on track with medications. I asked him to please continue to use condoms with his sexual partners until he achieves better adherence with Biktarvy. Continue both biktarvy and bactrim daily. Viral load today. Will have him return in 2 months for adherence check in again.       Relevant Medications   bictegravir-emtricitabine-tenofovir AF (BIKTARVY) 50-200-25 MG TABS tablet   sulfamethoxazole-trimethoprim (BACTRIM DS,SEPTRA DS) 800-160 MG tablet   Other Relevant Orders   HIV-1 RNA quant-no reflex-bld   T-helper cell (CD4)- ([facility] clinic only)   RPR   CBC with Differential/Platelet   Anemia    Recheck CBC today       Atopic dermatitis    I called in refills a month ago - he needs to contact pharmacy. OK to continue refilling for him. If it does not resolve rashes will refer to  dermatology.       Healthcare maintenance    Flu vaccine today. He will need HPV #2 at next visit and Prevnar in January 2020      History of syphilis    Other Visit Diagnoses    Need for immunization against influenza       Relevant Orders   Flu Vaccine QUAD 36+ mos IM (Completed)     Meds ordered this encounter  Medications  . bictegravir-emtricitabine-tenofovir AF (BIKTARVY) 50-200-25 MG TABS tablet    Sig: Take 1 tablet by mouth daily.    Dispense:  30 tablet    Refill:  5    Order Specific Question:   Supervising Provider    Answer:   HATCHER, JEFFREY C [[id number]]  . sulfamethoxazole-trimethoprim (BACTRIM DS,SEPTRA DS) 800-160  MG tablet    Sig: Take 1 tablet by mouth daily.    Dispense:  30 tablet    Refill:  5    Order Specific Question:   Supervising Provider    Answer:   HATCHER, JEFFREY C [[id number]]   Return in about 2 months (around 09/13/2018).   Rexene Alberts, MSN, NP-C [facility]  07/14/2018

## 2018-07-14 NOTE — Assessment & Plan Note (Signed)
I called in refills a month ago - he needs to contact pharmacy. OK to continue refilling for him. If it does not resolve rashes will refer to dermatology.

## 2018-07-14 NOTE — Assessment & Plan Note (Signed)
Flu vaccine today. He will need HPV #2 at next visit and Prevnar in January 2020

## 2018-07-14 NOTE — Patient Instructions (Addendum)
Please keep taking your Biktarvy and your Bactrim once a day like you are.   Will see you back in 2 months to check in again.

## 2018-07-14 NOTE — Assessment & Plan Note (Signed)
Reviewed lab results in depth and showed him how in the setting of poor adherence his HIV gets out of control. He was surprised at this today. Appreciate [name]'s and Manuel's help to keep him on track with medications. I asked him to please continue to use condoms with his sexual partners until he achieves better adherence with Biktarvy. Continue both biktarvy and bactrim daily. Viral load today. Will have him return in 2 months for adherence check in again.

## 2018-07-14 NOTE — Assessment & Plan Note (Signed)
Recheck CBC today. 

## 2018-07-15 LAB — T-HELPER CELL (CD4) - (RCID CLINIC ONLY)
CD4 T CELL HELPER: 12 % — AB (ref 33–55)
CD4 T Cell Abs: 70 /uL — ABNORMAL LOW (ref 400–2700)

## 2018-07-15 NOTE — Progress Notes (Signed)
Anemia improved with HIV treatment - now 13.1. Platelets are low at 79 - likely dropped with recent significant HIV viremia. Hopefully with continuous use this will normalize - Will recheck at his next appointment to follow.

## 2018-07-18 LAB — RPR: RPR: REACTIVE — AB

## 2018-07-18 LAB — CBC WITH DIFFERENTIAL/PLATELET
BASOS ABS: 19 {cells}/uL (ref 0–200)
Basophils Relative: 0.7 %
EOS ABS: 230 {cells}/uL (ref 15–500)
Eosinophils Relative: 8.5 %
HCT: 40 % (ref 38.5–50.0)
HEMOGLOBIN: 13.1 g/dL — AB (ref 13.2–17.1)
LYMPHS ABS: 597 {cells}/uL — AB (ref 850–3900)
MCH: 28.4 pg (ref 27.0–33.0)
MCHC: 32.8 g/dL (ref 32.0–36.0)
MCV: 86.8 fL (ref 80.0–100.0)
MPV: 12.5 fL (ref 7.5–12.5)
Monocytes Relative: 21 %
NEUTROS ABS: 1288 {cells}/uL — AB (ref 1500–7800)
Neutrophils Relative %: 47.7 %
Platelets: 79 10*3/uL — ABNORMAL LOW (ref 140–400)
RBC: 4.61 10*6/uL (ref 4.20–5.80)
RDW: 13.1 % (ref 11.0–15.0)
Total Lymphocyte: 22.1 %
WBC mixed population: 567 cells/uL (ref 200–950)
WBC: 2.7 10*3/uL — ABNORMAL LOW (ref 3.8–10.8)

## 2018-07-18 LAB — FLUORESCENT TREPONEMAL AB(FTA)-IGG-BLD: Fluorescent Treponemal ABS: REACTIVE — AB

## 2018-07-18 LAB — RPR TITER: RPR Titer: 1:4 {titer} — ABNORMAL HIGH

## 2018-07-18 LAB — HIV-1 RNA QUANT-NO REFLEX-BLD
HIV 1 RNA Quant: 43100 copies/mL — ABNORMAL HIGH
HIV-1 RNA Quant, Log: 4.63 Log copies/mL — ABNORMAL HIGH

## 2018-07-19 ENCOUNTER — Telehealth: Payer: Self-pay | Admitting: *Deleted

## 2018-07-19 ENCOUNTER — Ambulatory Visit: Payer: Self-pay | Admitting: *Deleted

## 2018-07-19 DIAGNOSIS — B20 Human immunodeficiency virus [HIV] disease: Secondary | ICD-10-CM

## 2018-07-19 DIAGNOSIS — B999 Unspecified infectious disease: Secondary | ICD-10-CM

## 2018-07-19 DIAGNOSIS — F1210 Cannabis abuse, uncomplicated: Secondary | ICD-10-CM

## 2018-07-19 NOTE — Progress Notes (Signed)
I will give her a call too - I think hearing from a provider can be helpful and stress urgency with needing to manage this.

## 2018-07-19 NOTE — Progress Notes (Signed)
Two important points - I am not certain (now that I recall his bicillin treatments) that he would consider. I will talk with him about it more at his next visit.   As far as his consent purpose he has a legal guardian so I think she would likely need to participate.

## 2018-07-19 NOTE — Progress Notes (Signed)
I have spoken with her a couple of time. I don't mind speaking with her. He also is not returning my calls or answering my text messages

## 2018-07-19 NOTE — Telephone Encounter (Signed)
Calls, VM and text messages have been sent to the patient without a response. I did speak with him once since our last visit and he said we could make a home visit another day because he was not home.   Called Ms. Dave and let her know that I am concerned that Selso is not taking his medications. We discussed his last 2 viral loads and his weakened immune system. Ms. Cambre stated she will give her son a call.  She called me back and stated she spoke with [name].  He states he has been taking the medications and I can come by today.   Visit arranged for today.

## 2018-07-19 NOTE — Addendum Note (Signed)
Addended by: Blanchard Kelch on: 07/19/2018 11:14 AM   Modules accepted: Orders

## 2018-07-21 NOTE — Progress Notes (Signed)
Prior calls, VM and text messages have been sent to the patient without a response.Called Aaron Lee and let her know that I am concerned that Aaron Lee is not taking his medications. We discussed his last 2 viral loads and his weakened immune system. Aaron Lee stated she will give her son a call.  She called me back and stated she spoke with Aaron Lee.  He states he has been taking the medications and I can come by today.   Visit made today and Aaron Lee admitted that he is not taking his medications.  Aaron Lee states it feels overwhelming when thinking about he has to take a medication for the rest of his life. I instructed Aaron Lee that sometimes when we think so far ahead it can be overwhelming. We agreed to focus on the next 7 days. I asked Aaron Lee what he could commit too. He could not commit to taking 2 pills everyday for the next 7 days, I asked if he would commit to taking his Biktarvy daily and Bactrim 3 times a week. Aaron Lee could not commit. Aaron Lee would commit to taking his Biktarvy daily and I committed to coming each week to check in with him. It's better for him to take his  Biktarvy consistently that to not take either medications.   Educated Aaron Lee on the effects to his immune system. Together we discussed what happens when the viral load is high and the hit our immune system takes. We discussed the season and the increased risk of illness. Aaron Lee states he is up to date with his immunizations and I reinforced the need for him to follow good hand hygiene and to take his Biktavry every day since he is not committing to taking his Bactrim. Aaron Lee verbalized understanding.

## 2018-07-26 ENCOUNTER — Ambulatory Visit: Payer: Self-pay | Admitting: *Deleted

## 2018-07-26 DIAGNOSIS — Z9119 Patient's noncompliance with other medical treatment and regimen: Secondary | ICD-10-CM

## 2018-07-26 DIAGNOSIS — Z91199 Patient's noncompliance with other medical treatment and regimen due to unspecified reason: Secondary | ICD-10-CM

## 2018-07-26 DIAGNOSIS — B20 Human immunodeficiency virus [HIV] disease: Secondary | ICD-10-CM

## 2018-08-02 ENCOUNTER — Other Ambulatory Visit: Payer: Medicaid Other | Admitting: *Deleted

## 2018-08-04 MED FILL — SULFAMETHOXAZOLE-TMP DS TAB: 800-160 | 30 days supply | Qty: 30 | Fill #0

## 2018-08-04 MED FILL — BIKTARVY 50-200-25 MG TABS: 50-200-25 | 30 days supply | Qty: 30 | Fill #0

## 2018-08-09 ENCOUNTER — Other Ambulatory Visit: Payer: Medicaid Other | Admitting: *Deleted

## 2018-08-15 ENCOUNTER — Telehealth: Payer: Self-pay | Admitting: *Deleted

## 2018-08-15 NOTE — Telephone Encounter (Signed)
I have reviewed and approve Ms McNeil's plan of care for this patient.   

## 2018-08-15 NOTE — Telephone Encounter (Signed)
Initial order received on 05/16/18 by Rexene AlbertsStephanie Dixon NP/Dr Hatcher to [facility]  1st attempt made on 05/17/18 with evaluation completed on 05/20/18 for [facility]. Patient was consented to care at this time.   Frequency / Duration of [facility] visits: Effective 08/19/18 871mo1, 363mo1, 762mo2 4 PRN's for complications with disease process/progression, medication changes or concerns   [facility] will assess for learning needs related to diagnosis and treatment regimen, provide education as needed, fill pill box if needed, address any barriers which may be preventing medication compliance, and communicating with care team including physician and case manager.   Individualized Plan Of Care with Certification Period effective 08/19/18  to 11/17/18  a. Type of service(s) and care to be delivered: RN Case Management  b. Frequency and duration of service: Effective 08/19/18 841mo1, 393mo1, 642mo2, 4 prns for complications  with disease process/progression, medication changes or  concerns . Visits/Contact may be conducted telephonically or in person to best suit the patient. Home visits will be offered as a 1st choice. Home is safe for visits  c. Activity restrictions: Pt may be up as tolerated and can safely ambulate without the need for a assistive device   d. Safety Measures: Standard Precautions/Infection Control   e. Service Objectives and Goals: Service Objectives are to assist the pt with HIV medication regimen adherence and staying in care with the Infectious Disease Clinic by identifying barriers to care. RN will address the barriers that are identified by the patient. Patient mother is his legal guardian and states she just wants to be sure he is taking his medications. Viremia noted on his last lab draw but he states he is adherent to his medications.   f. Equipment required: No additional equipment needs at this time   g. Functional Limitations: None  noted  h. Rehabilitation potential: Guarded   i. Diet and Nutritional Needs: Regular Diet   j. Medications and treatments: Medications have been reconciled and reviewed and are a part of EPIC electronic file   k. Specific therapies if needed: RN   l. Pertinent diagnoses: HIV disease,  Hx of medication NonCompliance, Patient has a legal guardian   m. Expected outcome: Guarded

## 2018-08-16 ENCOUNTER — Other Ambulatory Visit: Payer: Medicaid Other | Admitting: *Deleted

## 2018-08-29 MED FILL — BIKTARVY 50-200-25 MG TABS: 50-200-25 | 30 days supply | Qty: 30 | Fill #1

## 2018-09-05 NOTE — Progress Notes (Signed)
Home visit made today with Berna SpareMarcus. Focus of today's visit is to discuss and assess medication adherence. Berna SpareMarcus states he has began to take his medications again without any problems. [facility] to the pill bottle fill date of March 2019. Berna SpareMarcus still have about 10 pills in his pill bottle with medications in his pillbox as well. Berna SpareMarcus explained, stating he has continued to use the same pillbottle but reassured me that he has been taking his medications daily.

## 2018-09-20 MED FILL — BIKTARVY 50-200-25 MG TABS: 50-200-25 | 30 days supply | Qty: 30 | Fill #2

## 2018-09-26 ENCOUNTER — Encounter: Payer: Self-pay | Admitting: Infectious Diseases

## 2018-09-26 ENCOUNTER — Ambulatory Visit (INDEPENDENT_AMBULATORY_CARE_PROVIDER_SITE_OTHER): Payer: Medicaid Other | Admitting: Infectious Diseases

## 2018-09-26 ENCOUNTER — Ambulatory Visit (INDEPENDENT_AMBULATORY_CARE_PROVIDER_SITE_OTHER): Payer: Medicaid Other | Admitting: Licensed Clinical Social Worker

## 2018-09-26 VITALS — BP 150/83 | HR 81 | Temp 97.9°F | Wt 106.0 lb

## 2018-09-26 DIAGNOSIS — Z8619 Personal history of other infectious and parasitic diseases: Secondary | ICD-10-CM | POA: Diagnosis not present

## 2018-09-26 DIAGNOSIS — F4320 Adjustment disorder, unspecified: Secondary | ICD-10-CM

## 2018-09-26 DIAGNOSIS — B20 Human immunodeficiency virus [HIV] disease: Secondary | ICD-10-CM

## 2018-09-26 DIAGNOSIS — B029 Zoster without complications: Secondary | ICD-10-CM

## 2018-09-26 HISTORY — DX: Zoster without complications: B02.9

## 2018-09-26 MED ORDER — MUPIROCIN CALCIUM 2 % NA OINT: 1.0000 "application " | TOPICAL_OINTMENT | Freq: Two times a day (BID) | NASAL | 0 refills | Status: DC

## 2018-09-26 MED ORDER — VALACYCLOVIR HCL 1 G PO TABS: 1000.0000 mg | ORAL_TABLET | Freq: Every day | ORAL | 3 refills | Status: DC

## 2018-09-26 MED ORDER — MUPIROCIN CALCIUM 2 % EX CREA: 1.0000 "application " | TOPICAL_CREAM | Freq: Two times a day (BID) | CUTANEOUS | 0 refills | Status: DC

## 2018-09-26 MED ORDER — BICTEGRAVIR-EMTRICITAB-TENOFOV 50-200-25 MG PO TABS: 1.0000 | ORAL_TABLET | Freq: Every day | ORAL | 5 refills | Status: DC

## 2018-09-26 MED ORDER — SULFAMETHOXAZOLE-TRIMETHOPRIM 800-160 MG PO TABS: 1.0000 | ORAL_TABLET | Freq: Every day | ORAL | 5 refills | Status: DC

## 2018-09-26 NOTE — Assessment & Plan Note (Signed)
RPR with next lab draw

## 2018-09-26 NOTE — Patient Instructions (Addendum)
For your back please use mupirocin cream twice a day to the area to help this heal. Would also need you to use Eucerin cream.   You have had a episode of shingles recently - I want you to start taking valcyclovir one tab once a day.   Please restart your Biktarvy and bactrim one a day.   Please come back in 1 month to see Judeth Cornfield again.

## 2018-09-26 NOTE — Assessment & Plan Note (Signed)
All lesions have crusted over. Secondary ulcerations on his back do not appear infected today but ulcerated d/t picking. Will give rx for mupirocin cream BID as well as frequent moisturization with over the counter Eucerin cream/vasaline to prevent itching. Will reassess again in 4 weeks. No role for antiviral therapy today aside from initiation of suppression dose of valtrex 1gm QD.

## 2018-09-26 NOTE — Assessment & Plan Note (Addendum)
HIV 1 RNA Quant (copies/mL)  Date Value  07/14/2018 43,100 (H)  05/16/2018 59,400 (H)  02/28/2018 209 (H)   CD4 T Cell Abs (/uL)  Date Value  07/14/2018 70 (L)  05/16/2018 100 (L)  02/28/2018 180 (L)   Poorly controlled. Again reviewed labs with him. Now that his phone is back on (same number) will have [name] and Kelby Fam reach out to him to help with adherence support. He will need to continue Bactrim once a day as well as his Biktarvy once a day. Will repeat viral load and CD4 in 4 weeks at his follow up visit with me.  Referral to [facility] today for depressed mood/adherence counseling and family discordance.  RTC in 4 weeks.

## 2018-09-26 NOTE — Progress Notes (Signed)
Name: Aaron Lee  DOB: 06/06/1991  MRN: [id number]  PCP: Eulah PontBlum, Nina, MD   Patient Active Problem List   Diagnosis Date Noted  . Shingles outbreak 09/26/2018  . Cognitive developmental delay 10/01/2017  . History of syphilis 08/26/2017  . Anemia 08/02/2017  . Healthcare maintenance 08/02/2017  . AIDS (acquired immune deficiency syndrome) (HCC) 07/26/2017  . Atopic dermatitis 03/31/2017  . Seasonal allergic rhinitis 03/31/2017   SUBJECTIVE: Aaron HeldMarcus D Cinque is a 28 y.o. AA male with HIV diagnosed in November 2018 with CD4 nadir < 10, VL 192,000 copies. HIV Risk: bisexual. OI Hx: none    Previous Regimens:   Biktarvy --> suppressed   Genotype:   07/2017 - K103S (R-nevirapine)   CC:  Berna SpareMarcus is here today for routine follow up on HIV infection. Rash present on left chest and abdomen that is very itchy.   HPI/ROS:  He is here today with his father however does not want him present during visit for discussion of care. Requested him to step out (after I gave them personal time to discuss amongst themselves.)   He is upset today, feels his father "just yells and nags at him". His mother is his legal guardian and usually talks much calmer with him. He tells me that he has been back on his medication for 1 month now although only taking one pill. He has a new phone now and turned back on. Requesting to work with Molli PoseyAmbre and Kelby FamManuel again as he needs help taking his medications. Reports no complaints today suggestive of associated opportunistic infection or advancing HIV disease such as fevers, night sweats, weight loss, anorexia, cough, SOB, nausea, vomiting, diarrhea, headache, sensory changes, lymphadenopathy or oral thrush. He does have a pruritic rash that is present on his back and abdomen. Started as blisters that ruptured clear fluid. Cannot tell me how long this has been present. Needs refill of trimcinolone cream.   Review of Systems  Constitutional: Negative for chills,  fever, malaise/fatigue and weight loss.  HENT: Negative for congestion and sore throat.   Eyes: Negative for double vision.  Respiratory: Negative for cough, sputum production and shortness of breath.   Cardiovascular: Negative.   Gastrointestinal: Negative for abdominal pain, diarrhea and vomiting.  Genitourinary: Negative.   Musculoskeletal: Negative for joint pain, myalgias and neck pain.  Skin: Positive for itching and rash.  Neurological: Negative for weakness and headaches.  Psychiatric/Behavioral: Negative for depression and substance abuse. The patient is not nervous/anxious.     Past Medical History:  Diagnosis Date  . Eczema   . HIV (human immunodeficiency virus infection) (HCC)   . Shingles outbreak 09/26/2018    Social History   Tobacco Use  . Smoking status: Light Tobacco Smoker    Packs/day: 0.10    Types: Cigarettes  . Smokeless tobacco: Never Used  Substance Use Topics  . Alcohol use: No    Comment: occ  . Drug use: Yes    Types: Marijuana   He  reports being sexually active and has had partner(s) who are Male and Male. He reports using the following method of birth control/protection: Condom.  No Known Allergies   Outpatient Medications Prior to Visit  Medication Sig Dispense Refill  . fluticasone (FLONASE) 50 MCG/ACT nasal spray Place 1 spray into both nostrils daily. 16 g 2  . hydrocortisone 2.5 % cream Apply topically 2 (two) times daily. To anterior chest 30 g 3  . bictegravir-emtricitabine-tenofovir AF (BIKTARVY) 50-200-25 MG TABS tablet Take  1 tablet by mouth daily. 30 tablet 5  . sulfamethoxazole-trimethoprim (BACTRIM DS,SEPTRA DS) 800-160 MG tablet Take 1 tablet by mouth daily. 30 tablet 5   No facility-administered medications prior to visit.     Objective: Vitals:   09/26/18 1430  BP: (!) 150/83  Pulse: 81  Temp: 97.9 F (36.6 C)   Physical Exam  Constitutional: He is oriented to person, place, and time and well-developed,  well-nourished, and in no distress.  Seated in chair. Emotionally upset today. Does not appear ill.   HENT:  Mouth/Throat: No oral lesions. Normal dentition. No dental caries. No oropharyngeal exudate.  Eyes: Pupils are equal, round, and reactive to light. No scleral icterus.  Cardiovascular: Normal rate, regular rhythm and normal heart sounds.  Pulmonary/Chest: Effort normal and breath sounds normal.  Abdominal: Soft. He exhibits no distension. There is no abdominal tenderness.  Lymphadenopathy:    He has no cervical adenopathy.  Neurological: He is alert and oriented to person, place, and time.  Skin: Skin is warm and dry. No rash noted.  Scabbed over rash that appears to have once been vesicular on trunk only on left side; stretches around rib cage to posterior near spine. Full thickness skin loss and ulcerations present with picking/scratching. No purulent drainage or erythema. Dry.   Psychiatric: Mood and affect normal.  Vitals reviewed.        Lab Results Lab Results  Component Value Date   WBC 2.7 (L) 07/14/2018   HGB 13.1 (L) 07/14/2018   HCT 40.0 07/14/2018   MCV 86.8 07/14/2018   PLT 79 (L) 07/14/2018    Lab Results  Component Value Date   CREATININE 1.18 05/16/2018   BUN 16 05/16/2018   NA 143 05/16/2018   K 3.9 05/16/2018   CL 107 05/16/2018   CO2 29 05/16/2018    Lab Results  Component Value Date   ALT 12 05/16/2018   AST 20 05/16/2018   BILITOT 0.4 05/16/2018    HIV 1 RNA Quant (copies/mL)  Date Value  07/14/2018 43,100 (H)  05/16/2018 59,400 (H)  02/28/2018 209 (H)   CD4 T Cell Abs (/uL)  Date Value  07/14/2018 70 (L)  05/16/2018 100 (L)  02/28/2018 180 (L)    Assessment and Plan: Problem List Items Addressed This Visit      Unprioritized   AIDS (acquired immune deficiency syndrome) (HCC)    Again reviewed labs with him. Now that his phone is back on (same number) will have [name] and Kelby Fam reach out to him to help with adherence support.  He will need to continue Bactrim once a day as well as his Biktarvy once a day. Will repeat viral load and CD4 in 4 weeks at his follow up visit with me.  Referral to [facility] today for depressed mood/adherence counseling and family discordance.  RTC in 4 weeks.       Relevant Medications   valACYclovir (VALTREX) 1000 MG tablet   mupirocin cream (BACTROBAN) 2 %   bictegravir-emtricitabine-tenofovir AF (BIKTARVY) 50-200-25 MG TABS tablet   sulfamethoxazole-trimethoprim (BACTRIM DS,SEPTRA DS) 800-160 MG tablet   History of syphilis    RPR with next lab draw       Shingles outbreak    All lesions have crusted over. Secondary ulcerations on his back do not appear infected today but ulcerated d/t picking. Will give rx for mupirocin cream BID as well as frequent moisturization with over the counter Eucerin cream/vasaline to prevent itching. Will reassess again in 4 weeks.  No role for antiviral therapy today aside from initiation of suppression dose of valtrex 1gm QD.       Relevant Medications   valACYclovir (VALTREX) 1000 MG tablet   mupirocin cream (BACTROBAN) 2 %   bictegravir-emtricitabine-tenofovir AF (BIKTARVY) 50-200-25 MG TABS tablet   sulfamethoxazole-trimethoprim (BACTRIM DS,SEPTRA DS) 800-160 MG tablet    Other Visit Diagnoses    HIV disease (HCC)    -  Primary   Relevant Medications   valACYclovir (VALTREX) 1000 MG tablet   mupirocin cream (BACTROBAN) 2 %   bictegravir-emtricitabine-tenofovir AF (BIKTARVY) 50-200-25 MG TABS tablet   sulfamethoxazole-trimethoprim (BACTRIM DS,SEPTRA DS) 800-160 MG tablet     Meds ordered this encounter  Medications  . DISCONTD: mupirocin nasal ointment (BACTROBAN) 2 %    Sig: Place 1 application into the nose 2 (two) times daily. Use one-half of tube in each nostril twice daily for five (5) days. After application, press sides of nose together and gently massage.    Dispense:  10 g    Refill:  0  . valACYclovir (VALTREX) 1000 MG tablet     Sig: Take 1 tablet (1,000 mg total) by mouth daily.    Dispense:  30 tablet    Refill:  3  . mupirocin cream (BACTROBAN) 2 %    Sig: Apply 1 application topically 2 (two) times daily.    Dispense:  15 g    Refill:  0  . bictegravir-emtricitabine-tenofovir AF (BIKTARVY) 50-200-25 MG TABS tablet    Sig: Take 1 tablet by mouth daily.    Dispense:  30 tablet    Refill:  5  . sulfamethoxazole-trimethoprim (BACTRIM DS,SEPTRA DS) 800-160 MG tablet    Sig: Take 1 tablet by mouth daily.    Dispense:  30 tablet    Refill:  5   No follow-ups on file.   Rexene Alberts, MSN, NP-C [facility] for Infectious Disease [facility]  09/26/2018

## 2018-09-26 NOTE — BH Specialist Note (Signed)
Integrated Behavioral Health Initial Visit  MRN: [id number] Name: Aaron Lee  Number of Integrated Behavioral Health Clinician visits:: 1/6 Session Start time: 3:18pm Session End time:  3:42pm Total time: 20 minutes  Type of Service: Integrated Behavioral Health- Individual/Family Interpretor:No. Interpretor Name and Language: n/a   Warm Hand Off Completed.       SUBJECTIVE: Aaron Lee is a 28 y.o. male accompanied by Father, but father was not present for counseling session Patient was referred by Rexene Alberts for adjustment and medication nonadherence. Patient reports the following symptoms/concerns: irritability, trouble concentrating, anxiety/stress, poor self-care Duration of problem: unknown; Severity of problem: moderate  OBJECTIVE: Mood: Depressed and Affect: Constricted Risk of harm to self or others: No plan to harm self or others  LIFE CONTEXT: Patient lives with his parents, there is tension between and his dad. He has cognitive delays and struggles with interactions with others at times. Patient has support of parents and girlfriend.  GOALS ADDRESSED: Patient will: 1. Demonstrate ability to: Increase healthy adjustment to current life circumstances  INTERVENTIONS: Interventions utilized: Motivational Interviewing and Solution-Focused Strategies    ASSESSMENT: Patient currently experiencing trouble with memory and concentration, irritability, relationship with dad, feeling stress/anxiety and poor self care. The most appropriate diagnosis at this time is Adjustment Disorder, Unspecified. Counselor will continue to assess in follow-up appointments.  Counselor and patient discussed patient's concerns about the rash he has, and how he has trouble taking his medication. Patient stated that he liked when someone came to his house and administered his medication everyday, because then he was more likely to take it. Counselor validated that accountability  is helpful in medication adherence. Counselor pointed out that if someone can come to the house and assist him, it will still take a while to set this up and he will need to be able to take his medications regularly and correctly on his own during that time. Counselor guided patient in identifying the problems he has with taking his medication. Patient stated that he feels "normal, like a person who doesn't have this" and that makes him think that he does not need to continue taking the medication until he gets sick. Counselor used concrete examples and explained to patient that the medication puts his virus effectively to sleep, which makes him feel "normal and healthy" because he is, but if he stops the medicine the virus may wake up, and then he may have to take a different or more medications to get back to the normal feeling. Patient was able to demonstrate the understanding that the medication cannot be stopped or the virus will "awaken" again. Counselor and patient brainstormed ways for patient to remember this. Patient stated that he needed a sign with this message. Counselor and patient made a sign for him that said "The medicine makes your virus go away. If you stop the medicine, the virus will come back." Patient stated he will put this where he sees it every day, and will accept help remembering to take his medication.   Patient may benefit from ongoing counseling sessions. He benefits from working with Paramedic, Financial risk analyst.   PLAN: 1. Patient did not wish to set a followup appointment but took counselor's card and agreed to call her if he wants to return.   Angus Palms, LCSW

## 2018-09-27 ENCOUNTER — Telehealth: Payer: Self-pay | Admitting: *Deleted

## 2018-09-27 ENCOUNTER — Other Ambulatory Visit: Payer: Self-pay | Admitting: Infectious Diseases

## 2018-09-27 MED ORDER — MUPIROCIN 2 % EX OINT: 1.0000 "application " | TOPICAL_OINTMENT | Freq: Two times a day (BID) | CUTANEOUS | 0 refills | Status: DC

## 2018-09-27 MED FILL — SULFAMETHOXAZOLE-TMP DS TAB: 800-160 | 30 days supply | Qty: 30 | Fill #0

## 2018-09-27 MED FILL — valACYclovir HCL 1 GM TABS: 1 | 30 days supply | Qty: 30 | Fill #0

## 2018-09-27 MED FILL — MUPIROCIN 2% OINTMENT: 2 | 10 days supply | Qty: 22 | Fill #0

## 2018-09-27 NOTE — Progress Notes (Signed)
Received message from pharmacy requesting replacement prescription.  Bactroban cream not covered by insurance, only ointment.  Andree Coss, RN

## 2018-09-27 NOTE — Progress Notes (Signed)
I need to arrange a visit with him and his mom at the same time. He is not honest with me and I need her to hear what I am telling him and hopefully she can help.

## 2018-09-27 NOTE — Progress Notes (Signed)
I pray that I can get through to him. He is a great guy! I hope visiting Rene Kocher will help too.

## 2018-09-27 NOTE — Addendum Note (Signed)
Addended by: Andree CossHOWELL, Foye Damron M on: 09/27/2018 04:52 PM   Modules accepted: Orders

## 2018-09-27 NOTE — Telephone Encounter (Signed)
I would like to arrange a home visit with Khaza and his mom so she can offer support and reinforce the education provided to MammothMarcus. Called Mom and left a message. Virgina Eveneralled Rune and he agreed to see me tomorrow for us to discuss ways to help with his adherence. Historically we start off well and he tells me he is taking the medications. Then at the next visit,  I may question why the same amount of pills are still present.  He then stops answering my calls and text messages During tomorrow's visit I will be sure he understands that I am not coming to judge him.

## 2018-09-28 ENCOUNTER — Ambulatory Visit: Payer: Self-pay | Admitting: *Deleted

## 2018-09-28 DIAGNOSIS — B20 Human immunodeficiency virus [HIV] disease: Secondary | ICD-10-CM

## 2018-09-28 DIAGNOSIS — B029 Zoster without complications: Secondary | ICD-10-CM

## 2018-10-17 MED FILL — BIKTARVY 50-200-25 MG TABS: 50-200-25 | 30 days supply | Qty: 30 | Fill #3

## 2018-10-21 NOTE — Progress Notes (Signed)
Home visit made with Aaron Lee today to readdress his adherence. Aaron Lee stated he is struggling to take his medications daily. Together we discussed his adherence and the risk of not taking his medication to include his recent diagnosis of shingles. Educated Aaron Lee that HIV slowly progresses and breaks down his ability to fight infections and sickness. Explained that right now without his medications he is ok but as months and years go by with medication his body will begin to suffer and it doesn't have too. Aaron Lee states his mom has decided to manage his medications since he has not been taking the medications correctly. Aaron Lee states his mom is having the medications mailed to her home and each morning  She is coming to his house to give him his medications.

## 2018-10-27 ENCOUNTER — Ambulatory Visit (INDEPENDENT_AMBULATORY_CARE_PROVIDER_SITE_OTHER): Payer: Medicaid Other | Admitting: Infectious Diseases

## 2018-10-27 ENCOUNTER — Encounter: Payer: Self-pay | Admitting: Infectious Diseases

## 2018-10-27 VITALS — BP 136/89 | HR 85 | Temp 98.1°F | Wt 108.0 lb

## 2018-10-27 DIAGNOSIS — Z8619 Personal history of other infectious and parasitic diseases: Secondary | ICD-10-CM | POA: Diagnosis not present

## 2018-10-27 DIAGNOSIS — B20 Human immunodeficiency virus [HIV] disease: Secondary | ICD-10-CM

## 2018-10-27 NOTE — Patient Instructions (Addendum)
Great job with getting back on track with your medications.   Every day I want to make sure you are taking the following pills one time a day every day:  1. BIKTARVY (small red pill) 2. BACTRIM (larger white pill) 3. VALTREX (larger blue pill)   Please come back in 6 weeks and check in with me again. I am so proud of you and thankful that [name]/Manuel is working with you.   Your rash looks much better.

## 2018-10-27 NOTE — Progress Notes (Signed)
Name: Aaron Lee  DOB: 1991-09-06  MRN: [id number]  PCP: Eulah Pont, MD   Patient Active Problem List   Diagnosis Date Noted  . History of shingles 09/26/2018  . Cognitive developmental delay 10/01/2017  . History of syphilis 08/26/2017  . Anemia 08/02/2017  . Healthcare maintenance 08/02/2017  . AIDS (acquired immune deficiency syndrome) (HCC) 07/26/2017  . Seasonal allergic rhinitis 03/31/2017   SUBJECTIVE: Aaron Lee is a 28 y.o. AA male with HIV diagnosed in November 2018 with CD4 nadir < 10, VL 192,000 copies. HIV Risk: bisexual. OI Hx: none    Previous Regimens:   Biktarvy --> suppressed   Genotype:   07/2017 - K103S (R-nevirapine)   CC:  Aaron Lee is here today for routine follow up on HIV infection and adherence counseling.   HPI/ROS:  He is so thankful for [name] and [facility] to help with making sure he has what he needs to treat his HIV. Says that [name] calls and checks on him all the time and to make sure he takes his medications. He reports taking 3 pills every day, white, red and blue. His rash has improved and is using topical medication I gave him also.   Review of Systems  Constitutional: Negative for chills, fever, malaise/fatigue and weight loss.  HENT: Negative for congestion and sore throat.   Eyes: Negative for double vision.  Respiratory: Negative for cough, sputum production and shortness of breath.   Cardiovascular: Negative.   Gastrointestinal: Negative for abdominal pain, diarrhea and vomiting.  Genitourinary: Negative.   Musculoskeletal: Negative for joint pain, myalgias and neck pain.  Skin: Positive for itching and rash.  Neurological: Negative for weakness and headaches.  Psychiatric/Behavioral: Negative for depression and substance abuse. The patient is not nervous/anxious.     Past Medical History:  Diagnosis Date  . Eczema   . HIV (human immunodeficiency virus infection) (HCC)   . Shingles outbreak 09/26/2018    Social  History   Tobacco Use  . Smoking status: Light Tobacco Smoker    Packs/day: 0.10    Types: Cigarettes  . Smokeless tobacco: Never Used  Substance Use Topics  . Alcohol use: No    Comment: occ  . Drug use: Yes    Types: Marijuana   He  reports being sexually active and has had partner(s) who are Male and Male. He reports using the following method of birth control/protection: Condom.  No Known Allergies   Outpatient Medications Prior to Visit  Medication Sig Dispense Refill  . bictegravir-emtricitabine-tenofovir AF (BIKTARVY) 50-200-25 MG TABS tablet Take 1 tablet by mouth daily. 30 tablet 5  . fluticasone (FLONASE) 50 MCG/ACT nasal spray Place 1 spray into both nostrils daily. (Patient not taking: Reported on 10/27/2018) 16 g 2  . hydrocortisone 2.5 % cream Apply topically 2 (two) times daily. To anterior chest (Patient not taking: Reported on 10/27/2018) 30 g 3  . mupirocin cream (BACTROBAN) 2 % Apply 1 application topically 2 (two) times daily. (Patient not taking: Reported on 10/27/2018) 15 g 0  . mupirocin ointment (BACTROBAN) 2 % Apply 1 application topically 2 (two) times daily. (Patient not taking: Reported on 10/27/2018) 22 g 0  . sulfamethoxazole-trimethoprim (BACTRIM DS,SEPTRA DS) 800-160 MG tablet Take 1 tablet by mouth daily. (Patient not taking: Reported on 10/27/2018) 30 tablet 5  . valACYclovir (VALTREX) 1000 MG tablet Take 1 tablet (1,000 mg total) by mouth daily. (Patient not taking: Reported on 10/27/2018) 30 tablet 3   No facility-administered medications  prior to visit.     Objective: Vitals:   10/27/18 1453  BP: 136/89  Pulse: 85  Temp: 98.1 F (36.7 C)   Physical Exam  Constitutional: He is oriented to person, place, and time and well-developed, well-nourished, and in no distress.  HENT:  Mouth/Throat: No oral lesions. Normal dentition. No dental caries.  Eyes: No scleral icterus.  Cardiovascular: Normal rate, regular rhythm and normal heart sounds.    Pulmonary/Chest: Effort normal and breath sounds normal.  Abdominal: Soft. He exhibits no distension. There is no abdominal tenderness.  Lymphadenopathy:    He has no cervical adenopathy.  Neurological: He is alert and oriented to person, place, and time.  Skin: Skin is warm and dry. No rash noted.  Left torso/back rash improved. Scarring noted but not surprised considering severity.   Psychiatric: Mood and affect normal.   Lab Results Lab Results  Component Value Date   WBC 2.7 (L) 07/14/2018   HGB 13.1 (L) 07/14/2018   HCT 40.0 07/14/2018   MCV 86.8 07/14/2018   PLT 79 (L) 07/14/2018    Lab Results  Component Value Date   CREATININE 1.18 05/16/2018   BUN 16 05/16/2018   NA 143 05/16/2018   K 3.9 05/16/2018   CL 107 05/16/2018   CO2 29 05/16/2018    Lab Results  Component Value Date   ALT 12 05/16/2018   AST 20 05/16/2018   BILITOT 0.4 05/16/2018    HIV 1 RNA Quant (copies/mL)  Date Value  07/14/2018 43,100 (H)  05/16/2018 59,400 (H)  02/28/2018 209 (H)   CD4 T Cell Abs (/uL)  Date Value  07/14/2018 70 (L)  05/16/2018 100 (L)  02/28/2018 180 (L)    Assessment and Plan: Problem List Items Addressed This Visit      Unprioritized   AIDS (acquired immune deficiency syndrome) (HCC) - Primary    Greatly appreciate [facility] / Manuel for support of Aaron Lee. I explained to him I was very proud of him but we need to as a goal get him independent to take his medications on his own volition. He agrees.  Will repeat viral load today.  Return in about 6 weeks (around 12/08/2018).       Relevant Orders   HIV-1 RNA quant-no reflex-bld   History of shingles    Rash healing nicely. Scarred on the back. Continue valtrex 1g qd for now until he has some improvement in immune system.          Return in about 6 weeks (around 12/08/2018).   Rexene AlbertsStephanie , MSN, NP-C [facility]  10/28/2018

## 2018-10-28 NOTE — Assessment & Plan Note (Signed)
Greatly appreciate Ll[facility] for support of Aaron Lee. I explained to him I was very proud of him but we need to as a goal get him independent to take his medications on his own volition. He agrees.  Will repeat viral load today.  Return in about 6 weeks (around 12/08/2018).

## 2018-10-28 NOTE — Assessment & Plan Note (Signed)
Rash healing nicely. Scarred on the back. Continue valtrex 1g qd for now until he has some improvement in immune system.

## 2018-10-29 LAB — HIV-1 RNA QUANT-NO REFLEX-BLD
HIV 1 RNA QUANT: 138000 {copies}/mL — AB
HIV-1 RNA QUANT, LOG: 5.14 {Log_copies}/mL — AB

## 2018-10-31 ENCOUNTER — Telehealth: Payer: Self-pay | Admitting: Infectious Diseases

## 2018-10-31 NOTE — Telephone Encounter (Signed)
HIV 1 RNA Quant (copies/mL)  Date Value  10/27/2018 138,000 (H)  07/14/2018 43,100 (H)  05/16/2018 59,400 (H)   CD4 T Cell Abs (/uL)  Date Value  07/14/2018 70 (L)  05/16/2018 100 (L)  02/28/2018 180 (L)   Unfortunately Aaron Lee is not taking his medications like he is telling me. I called to discuss with his mom today. She will start going over to his house in the morning to watch him take his medications to help reinforce for him. She was trying to "give him his space and be patient." with him however he is not able to maintain on his own. He was previously in March of 2019 when undetectable lived with his sister for more supervised care setting. I discussed with mom that we may need to consider this again as he came into care with advanced HIV/AIDS disease and if he does not increase adherence will likely suffer more side effects of immunocompromised condition.   Explained the rash he had was shingles and at risk for this coming back again. She was not aware it was shingles.   Informed [name] as well to help with community support.

## 2018-11-08 ENCOUNTER — Other Ambulatory Visit: Payer: Self-pay | Admitting: Infectious Diseases

## 2018-11-08 DIAGNOSIS — B20 Human immunodeficiency virus [HIV] disease: Secondary | ICD-10-CM

## 2018-11-08 DIAGNOSIS — B029 Zoster without complications: Secondary | ICD-10-CM

## 2018-11-08 MED FILL — BIKTARVY 50-200-25 MG TABS: 50-200-25 | 30 days supply | Qty: 30 | Fill #4

## 2018-11-09 MED FILL — MUPIROCIN 2% OINTMENT: 2 | 10 days supply | Qty: 22 | Fill #0

## 2018-11-18 ENCOUNTER — Telehealth: Payer: Self-pay | Admitting: *Deleted

## 2018-11-18 NOTE — Telephone Encounter (Signed)
I have reviewed and approve Ms McNeil's plan of care for this patient.   

## 2018-11-18 NOTE — Telephone Encounter (Signed)
Initial order received on 05/16/18 by Rexene Alberts NP/Dr Hatcher to evaluate patient for [facility]).  1st attempt made on 05/17/18 with evaluation completed on 05/20/18 for [facility]. Patient was consented to care at this time.   Frequency / Duration of [facility] visits: Effective 11/18/18 24mo1, 33mo2 4 PRN's for complications with disease process/progression, medication changes or concerns   [facility] will assess for learning needs related to diagnosis and treatment regimen, provide education as needed, fill pill box if needed, address any barriers which may be preventing medication compliance, and communicating with care team including physician and case manager.   Individualized Plan Of Care with Certification Period effective 11/18/18 to 02/16/19  a. Type of service(s) and care to be delivered: RN Case Management  b. Frequency and duration of service: Effective 11/18/18 56mo1,  71mo2, 4 prns for complications with disease process/progression, medication changes or  concerns . Visits/Contact may be conducted telephonically or in person to best suit the patient. Home visits will be offered as a 1st choice. Home is safe for visits  c. Activity restrictions: Pt may be up as tolerated and can safely ambulate without the need for a assistive device   d. Safety Measures: Standard Precautions/Infection Control   e. Service Objectives and Goals: Service Objectives are to assist the pt with HIV medication regimen adherence and staying in care with the Infectious Disease Clinic by identifying barriers to care. RN will address the barriers that are identified by the patient. Patient mother is his legal guardian and states she just wants to be sure he is taking his medications. Viremia noted on his last lab draw but he states he is adherent to his medications.   f. Equipment required: No additional equipment needs at this time   g. Functional Limitations: None noted  h.  Rehabilitation potential: Guarded   i. Diet and Nutritional Needs: Regular Diet   j. Medications and treatments: Medications have been reconciled and reviewed and are a part of EPIC electronic file   k. Specific therapies if needed: RN   l. Pertinent diagnoses: HIV disease,  Hx of medication NonCompliance, Patient has a legal guardian   m. Expected outcome: Guarded

## 2018-11-22 ENCOUNTER — Ambulatory Visit (INDEPENDENT_AMBULATORY_CARE_PROVIDER_SITE_OTHER): Payer: Medicaid Other | Admitting: Infectious Diseases

## 2018-11-22 ENCOUNTER — Encounter: Payer: Self-pay | Admitting: Infectious Diseases

## 2018-11-22 VITALS — BP 127/72 | HR 90 | Temp 98.3°F | Wt 107.0 lb

## 2018-11-22 DIAGNOSIS — B20 Human immunodeficiency virus [HIV] disease: Secondary | ICD-10-CM | POA: Diagnosis not present

## 2018-11-22 NOTE — Progress Notes (Signed)
Name: Aaron Lee  DOB: [birth year]  MRN: [id number]  PCP: Eulah Pont, MD   Patient Active Problem List   Diagnosis Date Noted  . History of shingles 09/26/2018  . Cognitive developmental delay 10/01/2017  . History of syphilis 08/26/2017  . Anemia 08/02/2017  . Healthcare maintenance 08/02/2017  . AIDS (acquired immune deficiency syndrome) (HCC) 07/26/2017  . Seasonal allergic rhinitis 03/31/2017   SUBJECTIVE: Aaron Lee is a 28 y.o. AA male with HIV diagnosed in November 2018 with CD4 nadir < 10, VL 192,000 copies. HIV Risk: bisexual. OI Hx: none    Previous Regimens:   Biktarvy --> suppressed   Genotype:   07/2017 - K103S (R-nevirapine)   CC:  [name] is here today for routine follow up on HIV infection and adherence counseling.   HPI/ROS:  He tells me that for the last 1/2 a month now he has taken his biktarvy consistently. He was not aware that he could not stop the medicine and thought that it would be cured. He has not received bactrim. He has ben thinking about how his mom can help him and would like for her to come over once in the morning before work to make sure he takes his medicine. His shingles rash has healed over nicely but still itchy on the back where it was more ulcerated at one point.   Review of Systems  Constitutional: Negative for chills, fever, malaise/fatigue and weight loss.  HENT: Negative for congestion and sore throat.   Eyes: Negative for double vision.  Respiratory: Negative for cough, sputum production and shortness of breath.   Cardiovascular: Negative.   Gastrointestinal: Negative for abdominal pain, diarrhea and vomiting.  Genitourinary: Negative.   Musculoskeletal: Negative for joint pain, myalgias and neck pain.  Skin: Positive for itching and rash.  Neurological: Negative for weakness and headaches.  Psychiatric/Behavioral: Negative for depression and substance abuse. The patient is not nervous/anxious.     Past  Medical History:  Diagnosis Date  . Eczema   . HIV (human immunodeficiency virus infection) (HCC)   . Shingles outbreak 09/26/2018    Social History   Tobacco Use  . Smoking status: Light Tobacco Smoker    Packs/day: 0.10    Types: Cigarettes  . Smokeless tobacco: Never Used  Substance Use Topics  . Alcohol use: No    Comment: occ  . Drug use: Yes    Types: Marijuana   He  reports being sexually active and has had partner(s) who are Male and Male. He reports using the following method of birth control/protection: Condom.  No Known Allergies   Outpatient Medications Prior to Visit  Medication Sig Dispense Refill  . bictegravir-emtricitabine-tenofovir AF (BIKTARVY) 50-200-25 MG TABS tablet Take 1 tablet by mouth daily. 30 tablet 5  . fluticasone (FLONASE) 50 MCG/ACT nasal spray Place 1 spray into both nostrils daily. 16 g 2  . hydrocortisone 2.5 % cream Apply topically 2 (two) times daily. To anterior chest 30 g 3  . mupirocin cream (BACTROBAN) 2 % Apply 1 application topically 2 (two) times daily. 15 g 0  . mupirocin ointment (BACTROBAN) 2 % APPLY 1 APPLICATION TOPICALLY 2 (TWO) TIMES DAILY. 22 g 5  . sulfamethoxazole-trimethoprim (BACTRIM DS,SEPTRA DS) 800-160 MG tablet Take 1 tablet by mouth daily. 30 tablet 5  . valACYclovir (VALTREX) 1000 MG tablet Take 1 tablet (1,000 mg total) by mouth daily. 30 tablet 3   No facility-administered medications prior to visit.     Objective:  Vitals:   11/22/18 1449  BP: 127/72  Pulse: 90  Temp: 98.3 F (36.8 C)   Physical Exam  Constitutional: He is oriented to person, place, and time and well-developed, well-nourished, and in no distress.  HENT:  Mouth/Throat: No oral lesions. Normal dentition. No dental caries.  Eyes: No scleral icterus.  Cardiovascular: Normal rate, regular rhythm and normal heart sounds.  Pulmonary/Chest: Effort normal and breath sounds normal.  Abdominal: Soft. He exhibits no distension. There is no  abdominal tenderness.  Lymphadenopathy:    He has no cervical adenopathy.  Neurological: He is alert and oriented to person, place, and time.  Skin: Skin is warm and dry. No rash noted.  Left torso/back rash continues to improve.   Psychiatric: Mood and affect normal.    Lab Results Lab Results  Component Value Date   WBC 2.7 (L) 07/14/2018   HGB 13.1 (L) 07/14/2018   HCT 40.0 07/14/2018   MCV 86.8 07/14/2018   PLT 79 (L) 07/14/2018    Lab Results  Component Value Date   CREATININE 1.18 05/16/2018   BUN 16 05/16/2018   NA 143 05/16/2018   K 3.9 05/16/2018   CL 107 05/16/2018   CO2 29 05/16/2018    Lab Results  Component Value Date   ALT 12 05/16/2018   AST 20 05/16/2018   BILITOT 0.4 05/16/2018    HIV 1 RNA Quant (copies/mL)  Date Value  10/27/2018 138,000 (H)  07/14/2018 43,100 (H)  05/16/2018 59,400 (H)   CD4 T Cell Abs (/uL)  Date Value  07/14/2018 70 (L)  05/16/2018 100 (L)  02/28/2018 180 (L)    Assessment and Plan: Problem List Items Addressed This Visit      Unprioritized   AIDS (acquired immune deficiency syndrome) (HCC) - Primary    Will have him return in 4 weeks to see me again and repeat viral load. I will reach out to [name] to help get him back on Bactrim once daily for now also. Called mother (detailed below) about plan for adherence.  He seemed to not understand that he would need indefinite medication for his condition. We spent a lot of time talking about how this was not like other infections that we kill the virus; discussed that it 'goes to sleep' but when we stop medications it 'wakes back up again.' He seemed to understand.   Will start having him rotate visits with me and Cassie for adherence support.        Other Visit Diagnoses    HIV disease (HCC)         11/23/2018 1:01 PM I called to discuss the care plan with his mother/legal guardian. Medications will continue to be shipped to address on [address] and she will come to  his house each morning to ensure he takes his medications. Will reach out to [name] to ensure she can help make certain th Bactrim is filled too.   I spent greater than 25 minutes with the patient including greater than 50% of time in face to face counsel. Other time spent in direct coordination of his care.   Rexene Alberts, MSN, NP-C [facility]  11/23/2018

## 2018-11-22 NOTE — Patient Instructions (Addendum)
Please continue taking your Biktarvy (red pill) and your bactrim (white pill) every single day.   This cannot ever be stopped because the HIV virus will "wake back up" and cause damage to your body again.   Would like to see you back in 1 month again to check in.  Please make sure you take your pill tray with you to [facility] to visit your grandma - I can't wait to hear about the trip!

## 2018-11-23 NOTE — Assessment & Plan Note (Signed)
Will have him return in 4 weeks to see me again and repeat viral load. I will reach out to [name] to help get him back on Bactrim once daily for now also. Called mother (detailed below) about plan for adherence.  He seemed to not understand that he would need indefinite medication for his condition. We spent a lot of time talking about how this was not like other infections that we kill the virus; discussed that it 'goes to sleep' but when we stop medications it 'wakes back up again.' He seemed to understand.   Will start having him rotate visits with me and Cassie for adherence support.

## 2018-11-28 ENCOUNTER — Telehealth: Payer: Self-pay | Admitting: *Deleted

## 2018-11-29 ENCOUNTER — Telehealth: Payer: Self-pay | Admitting: *Deleted

## 2018-11-29 NOTE — Telephone Encounter (Signed)
Call made today to Uel' mom. I would like to see if we can offer any additional assistance to her with keeping Colbie adherent to his medication regimen. VM left at this time.

## 2018-11-29 NOTE — Telephone Encounter (Signed)
Call made today to Aaron Lee' mom. I would like to see if we can offer any additional assistance to her with keeping Teion adherent to his medication regimen. No answer at this time.

## 2018-12-01 ENCOUNTER — Ambulatory Visit: Payer: Self-pay | Admitting: Infectious Diseases

## 2018-12-05 MED FILL — BIKTARVY 50-200-25 MG TABS: 50-200-25 | 30 days supply | Qty: 30 | Fill #5

## 2018-12-05 MED FILL — SULFAMETHOXAZOLE-TMP DS TAB: 800-160 | 30 days supply | Qty: 30 | Fill #1

## 2018-12-27 ENCOUNTER — Encounter: Payer: Self-pay | Admitting: Infectious Diseases

## 2018-12-27 ENCOUNTER — Other Ambulatory Visit: Payer: Self-pay

## 2018-12-27 ENCOUNTER — Ambulatory Visit (INDEPENDENT_AMBULATORY_CARE_PROVIDER_SITE_OTHER): Payer: Medicaid Other | Admitting: Infectious Diseases

## 2018-12-27 VITALS — BP 116/75 | HR 101 | Temp 98.0°F | Ht 63.0 in | Wt 101.0 lb

## 2018-12-27 DIAGNOSIS — Z7689 Persons encountering health services in other specified circumstances: Secondary | ICD-10-CM | POA: Diagnosis not present

## 2018-12-27 DIAGNOSIS — B20 Human immunodeficiency virus [HIV] disease: Secondary | ICD-10-CM | POA: Diagnosis not present

## 2018-12-27 DIAGNOSIS — Z0000 Encounter for general adult medical examination without abnormal findings: Secondary | ICD-10-CM

## 2018-12-27 MED ORDER — SULFAMETHOXAZOLE-TRIMETHOPRIM 800-160 MG PO TABS: 1.0000 | ORAL_TABLET | Freq: Every day | ORAL | 5 refills | Status: DC

## 2018-12-27 MED ORDER — BICTEGRAVIR-EMTRICITAB-TENOFOV 50-200-25 MG PO TABS: 1.0000 | ORAL_TABLET | Freq: Every day | ORAL | 5 refills | Status: DC

## 2018-12-27 NOTE — Progress Notes (Signed)
Name: Aaron Lee  DOB: 05/23/1991  MRN: [id number]  PCP: Eulah Pont, MD   Patient Active Problem List   Diagnosis Date Noted  . History of shingles 09/26/2018  . Cognitive developmental delay 10/01/2017  . History of syphilis 08/26/2017  . Anemia 08/02/2017  . Healthcare maintenance 08/02/2017  . AIDS (acquired immune deficiency syndrome) (HCC) 07/26/2017  . Seasonal allergic rhinitis 03/31/2017   SUBJECTIVE: [name] is a 28 y.o. AA male with HIV diagnosed in November 2018 with CD4 nadir < 10, VL 192,000 copies. HIV Risk: bisexual. OI Hx: none    Previous Regimens:   Biktarvy --> suppressed   Genotype:   07/2017 - K103S (R-nevirapine)   CC:  Aaron Lee is here today for routine follow up on HIV infection and adherence counseling.   HPI/ROS:  Darinel tells me that he is doing well. No changes to his health history to report. He has been staying at home most of the time and has only been out of the house for necessary trips.  He tells me he is taking his little red pill and little right white pills much better.  His mother continues to come over to help him with medications in the morning.  This is easier now that she is not working.  He is happy to see me today.  Would like to discuss coronavirus and what this means for him.  His mother has a cough and it worries him.   Review of Systems  Constitutional: Negative for chills, fever, malaise/fatigue and weight loss.  HENT: Negative for congestion and sore throat.   Eyes: Negative for double vision.  Respiratory: Negative for cough, sputum production and shortness of breath.   Cardiovascular: Negative.   Gastrointestinal: Negative for abdominal pain, diarrhea and vomiting.  Genitourinary: Negative.   Musculoskeletal: Negative for joint pain, myalgias and neck pain.  Skin: Negative for itching and rash.  Neurological: Negative for weakness and headaches.  Psychiatric/Behavioral: Negative for depression and  substance abuse. The patient is not nervous/anxious.     Past Medical History:  Diagnosis Date  . Eczema   . HIV (human immunodeficiency virus infection) (HCC)   . Shingles outbreak 09/26/2018    Social History   Tobacco Use  . Smoking status: Light Tobacco Smoker    Packs/day: 0.10    Types: Cigarettes  . Smokeless tobacco: Never Used  Substance Use Topics  . Alcohol use: No    Comment: occ  . Drug use: Yes    Types: Marijuana   He  reports being sexually active and has had partner(s) who are Male and Male. He reports using the following method of birth control/protection: Condom.  No Known Allergies   Outpatient Medications Prior to Visit  Medication Sig Dispense Refill  . fluticasone (FLONASE) 50 MCG/ACT nasal spray Place 1 spray into both nostrils daily. 16 g 2  . hydrocortisone 2.5 % cream Apply topically 2 (two) times daily. To anterior chest 30 g 3  . mupirocin cream (BACTROBAN) 2 % Apply 1 application topically 2 (two) times daily. 15 g 0  . mupirocin ointment (BACTROBAN) 2 % APPLY 1 APPLICATION TOPICALLY 2 (TWO) TIMES DAILY. 22 g 5  . valACYclovir (VALTREX) 1000 MG tablet Take 1 tablet (1,000 mg total) by mouth daily. 30 tablet 3  . bictegravir-emtricitabine-tenofovir AF (BIKTARVY) 50-200-25 MG TABS tablet Take 1 tablet by mouth daily. 30 tablet 5  . sulfamethoxazole-trimethoprim (BACTRIM DS,SEPTRA DS) 800-160 MG tablet Take 1 tablet by mouth  daily. 30 tablet 5   No facility-administered medications prior to visit.     Objective: Vitals:   12/27/18 1510  BP: 116/75  Pulse: (!) 101  Temp: 98 F (36.7 C)   Physical Exam  Constitutional: He is oriented to person, place, and time and well-developed, well-nourished, and in no distress.  HENT:  Mouth/Throat: No oral lesions. Normal dentition. No dental caries.  Eyes: No scleral icterus.  Cardiovascular: Normal rate, regular rhythm and normal heart sounds.  Pulmonary/Chest: Effort normal and breath sounds  normal.  Abdominal: Soft. He exhibits no distension. There is no abdominal tenderness.  Lymphadenopathy:    He has no cervical adenopathy.  Neurological: He is alert and oriented to person, place, and time.  Skin: Skin is warm and dry. No rash noted.  Psychiatric: Mood and affect normal.  Appears little anxious in the chair today.  Smells heavily of marijuana    Lab Results Lab Results  Component Value Date   WBC 2.7 (L) 07/14/2018   HGB 13.1 (L) 07/14/2018   HCT 40.0 07/14/2018   MCV 86.8 07/14/2018   PLT 79 (L) 07/14/2018    Lab Results  Component Value Date   CREATININE 1.18 05/16/2018   BUN 16 05/16/2018   NA 143 05/16/2018   K 3.9 05/16/2018   CL 107 05/16/2018   CO2 29 05/16/2018    Lab Results  Component Value Date   ALT 12 05/16/2018   AST 20 05/16/2018   BILITOT 0.4 05/16/2018    HIV 1 RNA Quant (copies/mL)  Date Value  10/27/2018 138,000 (H)  07/14/2018 43,100 (H)  05/16/2018 59,400 (H)   CD4 T Cell Abs (/uL)  Date Value  07/14/2018 70 (L)  05/16/2018 100 (L)  02/28/2018 180 (L)    Assessment and Plan: Problem List Items Addressed This Visit      Unprioritized   AIDS (acquired immune deficiency syndrome) (HCC) - Primary    Counseled to continue Bactrim once a day.  He does not have any signs of opportunistic infection on exam today.  We will repeat his CD4 count today.  I am hopeful that his adherence is as he states.  He has not been honest with us in the past however his mother is now more active in his care.  I will call her with his results from today's viral load and CD4.  He will return again in 2 months for adherence counseling and follow-up.      Relevant Medications   bictegravir-emtricitabine-tenofovir AF (BIKTARVY) 50-200-25 MG TABS tablet   sulfamethoxazole-trimethoprim (BACTRIM DS,SEPTRA DS) 800-160 MG tablet   Other Relevant Orders   HIV-1 RNA quant-no reflex-bld   T-helper cell (CD4)- ([facility] clinic only)   Healthcare maintenance     Will offer Prevnar vaccine at next office visit.  We are unable to administer today.       Other Visit Diagnoses    Encounter for assessment of STD exposure       Relevant Orders   RPR      I spent greater than 25 minutes with the patient including greater than 50% of time in face to face counsel. Other time spent in direct coordination of his care.   Rexene AlbertsStephanie Sylus Stgermain, MSN, NP-C [facility]  12/27/2018

## 2018-12-27 NOTE — Assessment & Plan Note (Signed)
Will offer Prevnar vaccine at next office visit.  We are unable to administer today.

## 2018-12-27 NOTE — Patient Instructions (Addendum)
It is nice to see you today.  We will repeat your blood work and I will give your mom a call with the results today.  I am hopeful that we will see good news that you have been taking your medications better on a daily basis  For the coronavirus going around: Typical symptoms include -Fever -Cough -Shortness of breath (difficulty catching her breate) -Chest pain  For your protection I want you to make certain to stay inside away from other people as much as you can.  Wash your hands multiple times a day, after all encounters with other people, before you eat, you come in from going outside.  Make trips out of your home only the necessary ones.  Wearing a mask out in public is now recommended from the [facility] website.  This could be a cloth scarf or a bandanna.  Please come back in 2 months and we will repeat your blood work and check in with you again.  You have refills at [facility] available.  Please call to reorder your Biktarvy and her Bactrim.  Please continue taking both of these pills every single day.

## 2018-12-27 NOTE — Assessment & Plan Note (Addendum)
Counseled to continue Bactrim once a day.  He does not have any signs of opportunistic infection on exam today.  We will repeat his CD4 count today.  I am hopeful that his adherence is as he states.  He has not been honest with [place] in the past however his mother is now more active in his care.  I will call her with his results from today's viral load and CD4.  He will return again in 2 months for adherence counseling and follow-up.

## 2018-12-28 LAB — T-HELPER CELL (CD4) - (RCID CLINIC ONLY)
CD4 % Helper T Cell: 8 % — ABNORMAL LOW (ref 33–55)
CD4 T Cell Abs: 30 /uL — ABNORMAL LOW (ref 400–2700)

## 2018-12-28 MED FILL — SULFAMETHOXAZOLE-TMP DS TAB: 800-160 | 30 days supply | Qty: 30 | Fill #2

## 2018-12-28 MED FILL — BIKTARVY 50-200-25 MG TABS: 50-200-25 | 30 days supply | Qty: 30 | Fill #0

## 2018-12-31 LAB — RPR TITER: RPR Titer: 1:8 {titer} — ABNORMAL HIGH

## 2018-12-31 LAB — HIV-1 RNA QUANT-NO REFLEX-BLD
HIV 1 RNA Quant: 113000 copies/mL — ABNORMAL HIGH
HIV-1 RNA Quant, Log: 5.05 Log copies/mL — ABNORMAL HIGH

## 2018-12-31 LAB — RPR: RPR Ser Ql: REACTIVE — AB

## 2018-12-31 LAB — FLUORESCENT TREPONEMAL AB(FTA)-IGG-BLD: Fluorescent Treponemal ABS: REACTIVE — AB

## 2019-01-02 ENCOUNTER — Telehealth: Payer: Self-pay | Admitting: *Deleted

## 2019-01-02 NOTE — Progress Notes (Signed)
Viral load is unchanged after 2 months. I suspect he is not taking his medications as he declares.   Aaron Lee can you please ask Clydie Braun to run a genotype for him (orders entered)?   Molli Posey can you please reach out to Jonhatan's mom for some support too and help me get in touch with her about his labs? I suspect that I will need to switch him to [facility] but will wait on his lab results.

## 2019-01-02 NOTE — Telephone Encounter (Signed)
Reached out to the patient today to reassess needs. I sent a texted message asking the patient if he is well and could I assist in anyway? Received a message stating "Yea" and so I replied again stating "glad to know you are well and asked him in anyway?" I did not receive another reply.

## 2019-01-02 NOTE — Addendum Note (Signed)
Addended by: Blanchard Kelch on: 01/02/2019 12:32 PM   Modules accepted: Orders

## 2019-01-04 ENCOUNTER — Telehealth: Payer: Self-pay

## 2019-01-04 NOTE — Telephone Encounter (Signed)
-----   Message from Blanchard Kelch, NP sent at 01/02/2019 12:32 PM EDT ----- Viral load is unchanged after 2 months. I suspect he is not taking his medications as he declares.   Wallie Char can you please ask Clydie Braun to run a genotype for him (orders entered)?   Molli Posey can you please reach out to Bryston's mom for some support too and help me get in touch with her about his labs? I suspect that I will need to switch him to [facility] but will wait on his lab results.

## 2019-01-14 LAB — HIV-1 INTEGRASE GENOTYPE

## 2019-01-14 LAB — HIV RNA, RTPCR W/R GT (RTI, PI,INT)
HIV 1 RNA Quant: 110000 copies/mL — ABNORMAL HIGH
HIV-1 RNA Quant, Log: 5.04 Log copies/mL — ABNORMAL HIGH

## 2019-01-14 LAB — HIV-1 GENOTYPE: HIV-1 Genotype: DETECTED — AB

## 2019-01-24 MED FILL — SULFAMETHOXAZOLE-TMP DS TAB: 800-160 | 30 days supply | Qty: 30 | Fill #3

## 2019-01-24 MED FILL — BIKTARVY 50-200-25 MG TABS: 50-200-25 | 30 days supply | Qty: 30 | Fill #1

## 2019-02-16 ENCOUNTER — Telehealth: Payer: Self-pay | Admitting: *Deleted

## 2019-02-17 MED FILL — BIKTARVY 50-200-25 MG TABS: 50-200-25 | 30 days supply | Qty: 30 | Fill #2

## 2019-02-17 MED FILL — SULFAMETHOXAZOLE-TMP DS TAB: 800-160 | 30 days supply | Qty: 30 | Fill #4

## 2019-02-21 ENCOUNTER — Other Ambulatory Visit: Payer: Self-pay

## 2019-02-21 ENCOUNTER — Ambulatory Visit (INDEPENDENT_AMBULATORY_CARE_PROVIDER_SITE_OTHER): Payer: Medicaid Other | Admitting: Pharmacist

## 2019-02-21 ENCOUNTER — Ambulatory Visit (INDEPENDENT_AMBULATORY_CARE_PROVIDER_SITE_OTHER): Payer: Medicaid Other | Admitting: Infectious Diseases

## 2019-02-21 DIAGNOSIS — B370 Candidal stomatitis: Secondary | ICD-10-CM | POA: Diagnosis not present

## 2019-02-21 DIAGNOSIS — Z9114 Patient's other noncompliance with medication regimen: Secondary | ICD-10-CM

## 2019-02-21 DIAGNOSIS — B20 Human immunodeficiency virus [HIV] disease: Secondary | ICD-10-CM | POA: Diagnosis not present

## 2019-02-21 MED ORDER — FLUCONAZOLE 100 MG PO TABS: 100.0000 mg | ORAL_TABLET | Freq: Every day | ORAL | 0 refills | Status: DC

## 2019-02-21 MED ORDER — DARUN-COBIC-EMTRICIT-TENOFAF 800-150-200-10 MG PO TABS: 1.0000 | ORAL_TABLET | Freq: Every day | ORAL | 5 refills | Status: DC

## 2019-02-21 NOTE — Progress Notes (Signed)
HPI: Aaron Lee is a 28 y.o. male who presents to the [facility] pharmacy clinic for HIV follow-up.  Patient Active Problem List   Diagnosis Date Noted  . Noncompliance with medication regimen 02/23/2019  . HIV disease (HCC) 02/23/2019  . History of shingles 09/26/2018  . Cognitive developmental delay 10/01/2017  . History of syphilis 08/26/2017  . Anemia 08/02/2017  . Healthcare maintenance 08/02/2017  . AIDS (acquired immune deficiency syndrome) (HCC) 07/26/2017  . Oral candidiasis 07/22/2017  . Seasonal allergic rhinitis 03/31/2017    Patient's Medications  New Prescriptions   DARUNAVIR-COBICISCTAT-EMTRICITABINE-TENOFOVIR ALAFENAMIDE (SYMTUZA) 800-150-200-10 MG TABS    Take 1 tablet by mouth daily with breakfast.   FLUCONAZOLE (DIFLUCAN) 100 MG TABLET    Take 1 tablet (100 mg total) by mouth daily.  Previous Medications   HYDROCORTISONE 2.5 % CREAM    Apply topically 2 (two) times daily. To anterior chest   MUPIROCIN CREAM (BACTROBAN) 2 %    Apply 1 application topically 2 (two) times daily.   MUPIROCIN OINTMENT (BACTROBAN) 2 %    APPLY 1 APPLICATION TOPICALLY 2 (TWO) TIMES DAILY.   SULFAMETHOXAZOLE-TRIMETHOPRIM (BACTRIM DS,SEPTRA DS) 800-160 MG TABLET    Take 1 tablet by mouth daily.   VALACYCLOVIR (VALTREX) 1000 MG TABLET    Take 1 tablet (1,000 mg total) by mouth daily.  Modified Medications   No medications on file  Discontinued Medications   BICTEGRAVIR-EMTRICITABINE-TENOFOVIR AF (BIKTARVY) 50-200-25 MG TABS TABLET    Take 1 tablet by mouth daily.   FLUTICASONE (FLONASE) 50 MCG/ACT NASAL SPRAY    Place 1 spray into both nostrils daily.    Allergies: No Known Allergies  Past Medical History: Past Medical History:  Diagnosis Date  . Eczema   . HIV (human immunodeficiency virus infection) (HCC)   . Shingles outbreak 09/26/2018    Social History: Social History   Socioeconomic History  . Marital status: Single    Spouse name: Not on file  . Number of children:  Not on file  . Years of education: Not on file  . Highest education level: Not on file  Occupational History  . Not on file  Social Needs  . Financial resource strain: Not on file  . Food insecurity:    Worry: Not on file    Inability: Not on file  . Transportation needs:    Medical: Not on file    Non-medical: Not on file  Tobacco Use  . Smoking status: Light Tobacco Smoker    Packs/day: 0.10    Types: Cigarettes  . Smokeless tobacco: Never Used  Substance and Sexual Activity  . Alcohol use: No    Comment: occ  . Drug use: Yes    Types: Marijuana  . Sexual activity: Yes    Partners: Female, Male    Birth control/protection: Condom  Lifestyle  . Physical activity:    Days per week: Not on file    Minutes per session: Not on file  . Stress: Not on file  Relationships  . Social connections:    Talks on phone: Not on file    Gets together: Not on file    Attends religious service: Not on file    Active member of club or organization: Not on file    Attends meetings of clubs or organizations: Not on file    Relationship status: Not on file  Other Topics Concern  . Not on file  Social History Narrative  . Not on file  Labs: Lab Results  Component Value Date   HIV1RNAQUANT 113,000 (H) 12/27/2018   HIV1RNAQUANT 110,000 (H) 12/27/2018   HIV1RNAQUANT 138,000 (H) 10/27/2018   CD4TABS 30 (L) 12/27/2018   CD4TABS 70 (L) 07/14/2018   CD4TABS 100 (L) 05/16/2018    RPR and STI Lab Results  Component Value Date   LABRPR REACTIVE (A) 12/27/2018   LABRPR REACTIVE (A) 07/14/2018   LABRPR REACTIVE (A) 02/28/2018   LABRPR REACTIVE (A) 11/11/2017   LABRPR REACTIVE (A) 08/02/2017   RPRTITER 1:8 (H) 12/27/2018   RPRTITER 1:4 (H) 07/14/2018   RPRTITER 1:8 (H) 02/28/2018   RPRTITER 1:16 (H) 11/11/2017   RPRTITER 1:32 (H) 08/02/2017    STI Results GC CT  08/02/2017 **POSITIVE**(A) Negative  08/02/2017 **POSITIVE**(A) Negative    Hepatitis B Lab Results  Component  Value Date   HEPBSAB REACTIVE (A) 08/02/2017   HEPBSAG NON-REACTIVE 08/02/2017   HEPBCAB NON-REACTIVE 08/02/2017   Hepatitis C Lab Results  Component Value Date   HEPCAB NON-REACTIVE 08/02/2017   Hepatitis A Lab Results  Component Value Date   HAV REACTIVE (A) 08/02/2017   Lipids: No results found for: CHOL, TRIG, HDL, CHOLHDL, VLDL, LDLCALC  Current HIV Regimen: Biktarvy  Assessment: Aaron Lee is here today to follow-up for HIV adherence.  He has had trouble in the past with taking his ART consistently.  His HIV viral load is 113,000 with a CD4 count of 30 back in April when it was last checked. He is supposed to be taking Biktarvy daily with Bactrim for OI prophylaxis.  He tells me today that he only takes his Biktarvy maybe 3-4 times per week.  When asked why, he really doesn't have a great answer except for that he forgets.  He does tell me that he consistently takes his "pneumonia pill" every day.  I explained the issue with this and also tried to explain that if he was more consistent with his ART then he wouldn't need the pneumonia pill.  I emphasized the importance of taking the Biktarvy and explained that if he were to pick one to take, it should be the [place]. He is unable to tell me the name of his ART, just that it is a small red one.  He does tell me that [name] helped him a lot in the past and would like her assistance again.   [facility] for a higher barrier to resistance as we are worried about development of resistance with his spotty adherence.  I explained the medication to him including the need to take it with food.  He decided that he would take it each morning with a piece of toast.  He lives by himself, which is a little suprising, but his mother and sister help him sometime.   I asked him to please take his Symtuza every morning for the next 2-3 weeks and then come and see me again to repeat labs and discuss how he did.  He agreed to do so.   He is also complaining of a rash on his face that appears to be dry skin. [name] advised him to get some OTC cream.  It also appears that he may be developing thrush, so he was prescribed fluconazole for a week. Will continue to follow him and try to help him as much as I can.  Plan: - Stop Biktarvy - Start Symtuza PO once daily with breafast - Start fluconazole 100 mg PO once daily x 7 days -  Continue Bactrim once daily - F/u with me again 6/25 at 230pm   L. , PharmD, BCIDP, AAHIVP, CPP Infectious Diseases Clinical Pharmacist [facility] 02/24/2019, 10:42 AM

## 2019-02-22 ENCOUNTER — Ambulatory Visit: Payer: Self-pay | Admitting: Pharmacist

## 2019-02-22 MED FILL — SYMTUZA 800-150-200-10 MG T: 800-150-200 | 30 days supply | Qty: 30 | Fill #0

## 2019-02-22 MED FILL — FLUCONAZOLE 100 MG TAB: 100 | 7 days supply | Qty: 7 | Fill #0

## 2019-02-23 ENCOUNTER — Encounter: Payer: Self-pay | Admitting: Infectious Diseases

## 2019-02-23 DIAGNOSIS — Z9114 Patient's other noncompliance with medication regimen: Secondary | ICD-10-CM | POA: Insufficient documentation

## 2019-02-23 DIAGNOSIS — B20 Human immunodeficiency virus [HIV] disease: Secondary | ICD-10-CM | POA: Insufficient documentation

## 2019-02-23 DIAGNOSIS — Z91148 Patient's other noncompliance with medication regimen for other reason: Secondary | ICD-10-CM | POA: Insufficient documentation

## 2019-02-23 NOTE — Assessment & Plan Note (Signed)
We will reach out to Aaron Lee again to see if we can get her back in to help [place].  I told him that Joice Lofts is a wonderful help and an excellent resource for him, but ultimately he is going to have to take responsibility to take his medications on his own.  That is the goal.  He was very quiet and did not say much during our conversation.

## 2019-02-23 NOTE — Progress Notes (Signed)
Name: Aaron Lee  DOB: 1990-12-08  MRN: [id number]  PCP: Ledell Noss, MD   Patient Active Problem List   Diagnosis Date Noted  . Noncompliance with medication regimen 02/23/2019  . HIV disease ([place]) 02/23/2019  . History of shingles 09/26/2018  . Cognitive developmental delay 10/01/2017  . History of syphilis 08/26/2017  . Anemia 08/02/2017  . Healthcare maintenance 08/02/2017  . AIDS (acquired immune deficiency syndrome) ([place]) 07/26/2017  . Oral candidiasis 07/22/2017  . Seasonal allergic rhinitis 03/31/2017   SUBJECTIVE: Aaron Lee is a 28 y.o. AA male with HIV diagnosed in November 2018 with CD4 nadir < 10, VL 192,000 copies.  HIV Risk: bisexual.  OI Hx: shingles  Previous Regimens:   Biktarvy --> suppressed   Genotype:   07/2017 - K103S (R-nevirapine)   CC:  Kaylee is here today for routine follow up on HIV and adherence counseling.  He has dry rashes on his face.  HPI/ROS:  [name] met with our pharmacist Cassie this morning.  He told her that he is only been taking his Biktarvy maybe 3 days a week.  Not necessarily consecutive days.  He has been taking his "pneumonia pill" every day, however.  There is no particular reason for this necessarily.  He feels that if Amber contacted him he would have a better time.  He tells me that his mother is no longer helping him on a daily basis with his medicines but does stop by about 3 days a week.  His only concern today is a dry rash that is present on bilateral cheeks near his mouth.  He says is not sexually active right now.   Review of Systems  Constitutional: Negative for chills, fever, malaise/fatigue and weight loss.  HENT: Negative for congestion and sore throat.   Eyes: Negative for double vision.  Respiratory: Negative for cough, sputum production and shortness of breath.   Cardiovascular: Negative.   Gastrointestinal: Negative for abdominal pain, diarrhea and vomiting.  Genitourinary: Negative.    Musculoskeletal: Negative for joint pain, myalgias and neck pain.  Skin: Positive for rash. Negative for itching.  Neurological: Negative for weakness and headaches.  Psychiatric/Behavioral: Negative for depression and substance abuse. The patient is not nervous/anxious.     Past Medical History:  Diagnosis Date  . Eczema   . HIV (human immunodeficiency virus infection) ([place])   . Shingles outbreak 09/26/2018    Social History   Tobacco Use  . Smoking status: Light Tobacco Smoker    Packs/day: 0.10    Types: Cigarettes  . Smokeless tobacco: Never Used  Substance Use Topics  . Alcohol use: No    Comment: occ  . Drug use: Yes    Types: Marijuana   He  reports being sexually active and has had partner(s) who are Male and Male. He reports using the following method of birth control/protection: Condom.  No Known Allergies   Outpatient Medications Prior to Visit  Medication Sig Dispense Refill  . hydrocortisone 2.5 % cream Apply topically 2 (two) times daily. To anterior chest 30 g 3  . mupirocin cream (BACTROBAN) 2 % Apply 1 application topically 2 (two) times daily. 15 g 0  . mupirocin ointment (BACTROBAN) 2 % APPLY 1 APPLICATION TOPICALLY 2 (TWO) TIMES DAILY. 22 g 5  . sulfamethoxazole-trimethoprim (BACTRIM DS,SEPTRA DS) 800-160 MG tablet Take 1 tablet by mouth daily. 30 tablet 5  . valACYclovir (VALTREX) 1000 MG tablet Take 1 tablet (1,000 mg total) by mouth daily. [place]  tablet 3   No facility-administered medications prior to visit.     Objective: Vitals:   Physical Exam  Constitutional: He is oriented to person, place, and time and well-developed, well-nourished, and in no distress.  HENT:  Mouth/Throat: No oral lesions. Normal dentition. No dental caries.  White patches over gumline and hard palate  Eyes: No scleral icterus.  Cardiovascular: Normal rate, regular rhythm and normal heart sounds.  Pulmonary/Chest: Effort normal and breath sounds normal.  Abdominal:  Soft. He exhibits no distension. There is no abdominal tenderness.  Lymphadenopathy:    He has no cervical adenopathy.  Neurological: He is alert and oriented to person, place, and time.  Skin: Skin is warm and dry. No rash noted.  Psychiatric: Mood and affect normal.  Seems withdrawn today.    Lab Results Lab Results  Component Value Date   WBC 2.7 (L) 07/14/2018   HGB 13.1 (L) 07/14/2018   HCT 40.0 07/14/2018   MCV 86.8 07/14/2018   PLT 79 (L) 07/14/2018    Lab Results  Component Value Date   CREATININE 1.18 05/16/2018   BUN 16 05/16/2018   NA 143 05/16/2018   K 3.9 05/16/2018   CL 107 05/16/2018   CO2 29 05/16/2018    Lab Results  Component Value Date   ALT 12 05/16/2018   AST 20 05/16/2018   BILITOT 0.4 05/16/2018    HIV 1 RNA Quant (copies/mL)  Date Value  12/27/2018 113,000 (H)  12/27/2018 110,000 (H)  10/27/2018 138,000 (H)   CD4 T Cell Abs (/uL)  Date Value  12/27/2018 30 (L)  07/14/2018 70 (L)  05/16/2018 100 (L)    Assessment and Plan: Problem List Items Addressed This Visit      Unprioritized   AIDS (acquired immune deficiency syndrome) ([place]) - Primary    He has white plaques on the gumline in the posterior pharynx a little bit.  Considering his CD4 count this is likely early signs of thrush.  Will prescribe fluconazole 100 mg daily x7 days.  Attempted to connect the dots that his previous shingles rash, rectal ulcerations, nonhealing wounds and and now this oral thrush again are all directly related to the fact that he is not taking his medication and controlling his HIV.  Continue his Bactrim once daily.      HIV disease ([place])    We will change him to [place] once daily.  We spent time discussing with Raeford today and showed him photos of what his new pill will look like.  We reemphasized multiple times to not take the red pill anymore.  He seems to understand.  He will return to see Cassie in 2 weeks to follow-up.  It would be helpful if he brought  his med box with him so we can see what he is taking.      Relevant Orders   Ambulatory Referral for Home Visit   Noncompliance with medication regimen    We will reach out to Amber again to see if we can get her back in to help [place].  I told him that Luetta Nutting is a wonderful help and an excellent resource for him, but ultimately he is going to have to take responsibility to take his medications on his own.  That is the goal.  He was very quiet and did not say much during our conversation.      Relevant Orders   Ambulatory Referral for Home Visit   Oral candidiasis    Fluconazole x  7d as above         Janene Madeira, MSN, NP-C [facility]  02/23/2019

## 2019-02-23 NOTE — Assessment & Plan Note (Signed)
He has white plaques on the gumline in the posterior pharynx a little bit.  Considering his CD4 count this is likely early signs of thrush.  Will prescribe fluconazole 100 mg daily x7 days.  Attempted to connect the dots that his previous shingles rash, rectal ulcerations, nonhealing wounds and and now this oral thrush again are all directly related to the fact that he is not taking his medication and controlling his HIV.  Continue his Bactrim once daily.

## 2019-02-23 NOTE — Assessment & Plan Note (Signed)
Fluconazole x 7d as above

## 2019-02-23 NOTE — Assessment & Plan Note (Signed)
We will change him to Symtuza once daily.  We spent time discussing with Aaron Lee today and showed him photos of what his new pill will look like.  We reemphasized multiple times to not take the red pill anymore.  He seems to understand.  He will return to see Cassie in 2 weeks to follow-up.  It would be helpful if he brought his med box with him so we can see what he is taking.

## 2019-02-28 NOTE — Telephone Encounter (Signed)
PATIENT ON HOLD  Plan of Care orders have expired effective 02/16/2019 but GOALS have not been completely meet at this time. I would like to connect with the patient and received further orders from MD. If I am unable to get in contact with her within the next 30 days I will have to discharge at that time. RN WILL NOT RESUME CARE UNTIL NEW MD ORDERS OBTAINED AND PATIENT ABLE/WILLING TO RE-ENGAGE IN CARE

## 2019-03-01 ENCOUNTER — Telehealth: Payer: Self-pay | Admitting: *Deleted

## 2019-03-01 NOTE — Telephone Encounter (Signed)
Contacted Tuan today and left a message stating that I am just reaching out to let him know that I am still here. I also asked Sterlin if there is anything I can do to encourage him to take his medications? I want to help Christophor but other attempts to assist him have failed. I have educated Yaniel on the disease progression and the effectiveness of the medications when taken, I tried speaking with his mother, offering to come weekly, calling daily reminders to him and filling his pillbox for him without any success. The reason for my call today was to ask Ibn what can I do to help him with taking his medications.

## 2019-03-07 ENCOUNTER — Telehealth: Payer: Self-pay | Admitting: *Deleted

## 2019-03-07 NOTE — Telephone Encounter (Signed)
Call made to [facility]' mom. I did not receive an answer at this time. Msg left stating the reason for my call is to f/u with her on how Aaron Lee is doing and to offer her any assistance to help him. Left my number for a return call.

## 2019-03-16 ENCOUNTER — Encounter: Payer: Self-pay | Admitting: *Deleted

## 2019-03-16 ENCOUNTER — Ambulatory Visit (INDEPENDENT_AMBULATORY_CARE_PROVIDER_SITE_OTHER): Payer: Medicaid Other | Admitting: Pharmacist

## 2019-03-16 ENCOUNTER — Other Ambulatory Visit: Payer: Self-pay

## 2019-03-16 DIAGNOSIS — B20 Human immunodeficiency virus [HIV] disease: Secondary | ICD-10-CM

## 2019-03-16 NOTE — Progress Notes (Signed)
HPI: Aaron Lee is a 28 y.o. male who presents to the [facility] for HIV follow-up.  Patient Active Problem List   Diagnosis Date Noted  . Noncompliance with medication regimen 02/23/2019  . HIV disease ([place]) 02/23/2019  . History of shingles 09/26/2018  . Cognitive developmental delay 10/01/2017  . History of syphilis 08/26/2017  . Anemia 08/02/2017  . Healthcare maintenance 08/02/2017  . AIDS (acquired immune deficiency syndrome) ([place]) 07/26/2017  . Oral candidiasis 07/22/2017  . Seasonal allergic rhinitis 03/31/2017    Patient's Medications  New Prescriptions   No medications on file  Previous Medications   DARUNAVIR-COBICISCTAT-EMTRICITABINE-TENOFOVIR ALAFENAMIDE (SYMTUZA) 800-150-200-10 MG TABS    Take 1 tablet by mouth daily with breakfast.   FLUCONAZOLE (DIFLUCAN) 100 MG TABLET    Take 1 tablet (100 mg total) by mouth daily.   HYDROCORTISONE 2.5 % CREAM    Apply topically 2 (two) times daily. To anterior chest   MUPIROCIN CREAM (BACTROBAN) 2 %    Apply 1 application topically 2 (two) times daily.   MUPIROCIN OINTMENT (BACTROBAN) 2 %    APPLY 1 APPLICATION TOPICALLY 2 (TWO) TIMES DAILY.   SULFAMETHOXAZOLE-TRIMETHOPRIM (BACTRIM DS,SEPTRA DS) 800-160 MG TABLET    Take 1 tablet by mouth daily.   VALACYCLOVIR (VALTREX) 1000 MG TABLET    Take 1 tablet (1,000 mg total) by mouth daily.  Modified Medications   No medications on file  Discontinued Medications   No medications on file    Allergies: No Known Allergies  Past Medical History: Past Medical History:  Diagnosis Date  . Eczema   . HIV (human immunodeficiency virus infection) ([place])   . Shingles outbreak 09/26/2018    Social History: Social History   Socioeconomic History  . Marital status: Single    Spouse name: Not on file  . Number of children: Not on file  . Years of education: Not on file  . Highest education level: Not on file  Occupational History  . Not on file  Social Needs  .  Financial resource strain: Not on file  . Food insecurity    Worry: Not on file    Inability: Not on file  . Transportation needs    Medical: Not on file    Non-medical: Not on file  Tobacco Use  . Smoking status: Light Tobacco Smoker    Packs/day: 0.10    Types: Cigarettes  . Smokeless tobacco: Never Used  Substance and Sexual Activity  . Alcohol use: No    Comment: occ  . Drug use: Yes    Types: Marijuana  . Sexual activity: Yes    Partners: Female, Male    Birth control/protection: Condom  Lifestyle  . Physical activity    Days per week: Not on file    Minutes per session: Not on file  . Stress: Not on file  Relationships  . Social Herbalist on phone: Not on file    Gets together: Not on file    Attends religious service: Not on file    Active member of club or organization: Not on file    Attends meetings of clubs or organizations: Not on file    Relationship status: Not on file  Other Topics Concern  . Not on file  Social History Narrative  . Not on file    Labs: Lab Results  Component Value Date   HIV1RNAQUANT 113,000 (H) 12/27/2018   HIV1RNAQUANT 110,000 (H) 12/27/2018   HIV1RNAQUANT 138,000 (H)  10/27/2018   CD4TABS 30 (L) 12/27/2018   CD4TABS 70 (L) 07/14/2018   CD4TABS 100 (L) 05/16/2018    RPR and STI Lab Results  Component Value Date   LABRPR REACTIVE (A) 12/27/2018   LABRPR REACTIVE (A) 07/14/2018   LABRPR REACTIVE (A) 02/28/2018   LABRPR REACTIVE (A) 11/11/2017   LABRPR REACTIVE (A) 08/02/2017   RPRTITER 1:8 (H) 12/27/2018   RPRTITER 1:4 (H) 07/14/2018   RPRTITER 1:8 (H) 02/28/2018   RPRTITER 1:16 (H) 11/11/2017   RPRTITER 1:32 (H) 08/02/2017    STI Results GC CT  08/02/2017 **POSITIVE**(A) Negative  08/02/2017 **POSITIVE**(A) Negative    Hepatitis B Lab Results  Component Value Date   HEPBSAB REACTIVE (A) 08/02/2017   HEPBSAG NON-REACTIVE 08/02/2017   HEPBCAB NON-REACTIVE 08/02/2017   Hepatitis C Lab Results   Component Value Date   HEPCAB NON-REACTIVE 08/02/2017   Hepatitis A Lab Results  Component Value Date   HAV REACTIVE (A) 08/02/2017   Lipids: No results found for: CHOL, TRIG, HDL, CHOLHDL, VLDL, LDLCALC  Current HIV Regimen: Symtuza  Assessment: Berna SpareMarcus is here today to follow-up for his HIV infection and ART adherence. He was switched from [place]Biktarvy to [place]Symtuza about 3 weeks ago.  He tells me today that he is taking it every day. He missed his dose last Friday but tells me that he has missed no other doses since starting earlier this month. He was smiling and stating that he was pleased with himself for remembering.  He takes it most mornings with a piece of toast but sometimes wakes up earlier or later depending on the day so his times are a bit sporadic.   He is not, however, taking his Bactrim like he is supposed to.  He states that he did not get it from the pharmacy last time. I checked with Kathie RhodesBetty and he received Bactrim at the end of May and is due for a refill in a few days. Will make sure he gets both the Symtuza and Bactrim. His thrush is better and he states that he completed the fluconazole.  I will check labs today to check adherence. I told him to be honest with me as his labs will tell me one way or another and he stated that he was being honest.  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  He starts next week. I congratulated him on the good news.  Will have him f/u with [name] in a month.  Plan: - Continue Symtuza PO once daily with breakfast - Continue Bactrim PO once daily for PCP ppx HIV viral load, CD4 today - F/u Stephanie 7/28 at 245pm   L. , PharmD, BCIDP, AAHIVP, CPP Infectious Diseases Clinical Pharmacist [facility] 03/16/2019, 4:28 PM

## 2019-03-17 ENCOUNTER — Telehealth: Payer: Self-pay | Admitting: *Deleted

## 2019-03-17 LAB — T-HELPER CELL (CD4) - (RCID CLINIC ONLY)
CD4 % Helper T Cell: 19 % — ABNORMAL LOW (ref 33–65)
CD4 T Cell Abs: 132 /uL — ABNORMAL LOW (ref 400–1790)

## 2019-03-20 MED FILL — SYMTUZA 800-150-200-10 MG T: 800-150-200 | 30 days supply | Qty: 30 | Fill #1

## 2019-03-20 MED FILL — SULFAMETHOXAZOLE-TMP DS TAB: 800-160 | 30 days supply | Qty: 30 | Fill #5

## 2019-03-22 LAB — HIV-1 RNA QUANT-NO REFLEX-BLD
HIV 1 RNA Quant: 2820 copies/mL — ABNORMAL HIGH
HIV-1 RNA Quant, Log: 3.45 Log copies/mL — ABNORMAL HIGH

## 2019-04-17 ENCOUNTER — Other Ambulatory Visit: Payer: Self-pay | Admitting: Infectious Diseases

## 2019-04-17 ENCOUNTER — Telehealth: Payer: Self-pay | Admitting: Infectious Diseases

## 2019-04-17 DIAGNOSIS — B20 Human immunodeficiency virus [HIV] disease: Secondary | ICD-10-CM

## 2019-04-17 MED FILL — SULFAMETHOXAZOLE-TMP DS TAB: 800-160 | 30 days supply | Qty: 30 | Fill #0

## 2019-04-17 NOTE — Telephone Encounter (Signed)
The intent of this communication is to inform the Health Care Team that this patient will be discharged from [facility]).  Greater than 3 attempts have been made to re-engage the patient  without any success .Moving forward, the [facility] will be willing to reopen the patient to services if and when the patient is ready to discuss medication adherence and HIV disease  management. Effective 03/17/19 patient will be discharged and removed from [facility] active patient listing.

## 2019-04-17 NOTE — Telephone Encounter (Signed)
COVID-19 Pre-Screening Questions: ° °Do you currently have a fever (>100 °F), chills or unexplained body aches? no  ° °Are you currently experiencing new cough, shortness of breath, sore throat, runny nose? no °•  °Have you recently travelled outside the state of [place] in the last 14 days? no  °•  °Have you been in contact with someone that is currently pending confirmation of Covid19 testing or has been confirmed to have the Covid19 virus?  no ° °**If the patient answers NO to ALL questions -  advise the patient to please call the clinic before coming to the office should any symptoms develop.  ° ° ° °

## 2019-04-18 ENCOUNTER — Other Ambulatory Visit: Payer: Self-pay

## 2019-04-18 ENCOUNTER — Ambulatory Visit (INDEPENDENT_AMBULATORY_CARE_PROVIDER_SITE_OTHER): Payer: Medicaid Other | Admitting: Infectious Diseases

## 2019-04-18 ENCOUNTER — Encounter: Payer: Self-pay | Admitting: Infectious Diseases

## 2019-04-18 VITALS — BP 111/74 | HR 85 | Temp 97.9°F

## 2019-04-18 DIAGNOSIS — Z9114 Patient's other noncompliance with medication regimen: Secondary | ICD-10-CM

## 2019-04-18 DIAGNOSIS — R21 Rash and other nonspecific skin eruption: Secondary | ICD-10-CM

## 2019-04-18 DIAGNOSIS — Z23 Encounter for immunization: Secondary | ICD-10-CM

## 2019-04-18 DIAGNOSIS — B20 Human immunodeficiency virus [HIV] disease: Secondary | ICD-10-CM | POA: Diagnosis not present

## 2019-04-18 DIAGNOSIS — Z8619 Personal history of other infectious and parasitic diseases: Secondary | ICD-10-CM | POA: Diagnosis not present

## 2019-04-18 MED ORDER — MUPIROCIN 2 % EX OINT: 1.0000 "application " | TOPICAL_OINTMENT | Freq: Two times a day (BID) | CUTANEOUS | 0 refills | Status: DC

## 2019-04-18 MED FILL — MUPIROCIN 2% OINTMENT: 2 | 14 days supply | Qty: 22 | Fill #0

## 2019-04-18 MED FILL — SYMTUZA 800-150-200-10 MG T: 800-150-200 | 30 days supply | Qty: 30 | Fill #2

## 2019-04-18 NOTE — Assessment & Plan Note (Signed)
RPR screen today

## 2019-04-18 NOTE — Assessment & Plan Note (Signed)
Continue Symtuza with food once daily. We will update his vaccines to include Prevnar and his final HPV series today.  We will vaccinate for influenza at his upcoming office visit in late September.

## 2019-04-18 NOTE — Patient Instructions (Addendum)
I am excited to see your lab results from today!   Please continue to take your Symtuza and Bactrim every day like you are now.   For your face - please continue to use vaseline multiple times a day to help. I have refilled the cream that is helping this heal. I believe that this may be related possibly to eczema from over-drying of your skin possibly from masking.   You can also use over the counter hydrocortisone cream to this area 1-2 times a day for a week to see if this helps. Do not use prescription strength because it is too strong for your skin  Please come back to see Aaron Lee in 2 months so we can see how you are doing and give you your flu shot.

## 2019-04-18 NOTE — Assessment & Plan Note (Signed)
The facial rash seems to be most consistent with atopic dermatitis.  No signs of superinfection today. I wonder if his mask uses making his face drier.  I have asked him to use Vaseline or Cetaphil to his face multiple times per day.  Avoid scratching and touching.  I will refill his mupirocin cream but have asked him to use over-the-counter hydrocortisone 1-2 times a day for a week to see if this helps. Referral to dermatology if this does not resolve.

## 2019-04-18 NOTE — Assessment & Plan Note (Addendum)
He is starting to show some reconstitution based on lab work 1 month ago CD4 count above 100.  We will repeat today and have him continue his Bactrim for another 3 months at least. No signs of opportunistic infection on my exam today. He does have chronic ongoing skin changes consistent with atopic dermatitis.  May be partially related to IRIS given his 3 fold increase in a short time on antiretroviral therapy.

## 2019-04-18 NOTE — Assessment & Plan Note (Signed)
We will continue to have him follow with myself and our pharmacy team for adherence check-in's every 1 to 2 months.  Likely this will improve now that he is living back with his sister again.

## 2019-04-18 NOTE — Progress Notes (Signed)
Name: Aaron HeldMarcus D Spegal  DOB: 10/19/1990  MRN: [id number]  PCP: Katherine RoanMacLean, Matthew, MD    Patient Active Problem List   Diagnosis Date Noted   Facial rash 04/18/2019   Noncompliance with medication regimen 02/23/2019   HIV disease (HCC) 02/23/2019   History of shingles 09/26/2018   Cognitive developmental delay 10/01/2017   History of syphilis 08/26/2017   Anemia 08/02/2017   Healthcare maintenance 08/02/2017   AIDS (acquired immune deficiency syndrome) (HCC) 07/26/2017   Seasonal allergic rhinitis 03/31/2017     SUBJECTIVE: Aaron Lee is a 28 y.o. AA male with HIV diagnosed in November 2018 with CD4 nadir < 10, VL 192,000 copies.  HIV Risk: bisexual.  OI Hx: shingles  Previous Regimens:   Biktarvy --> suppressed   Genotype:   07/2017 - K103S (R-nevirapine)   CC:  Berna SpareMarcus is here today for routine follow up on HIV and adherence counseling.  He has dry rashes on his face.  HPI/ROS:  Berna SpareMarcus is here for routine HIV follow-up and adherence check-in.  He has been meeting both with myself and Cassie our pharmacist due to his uncontrolled HIV viremia.  Berna SpareMarcus tells me he has been continuing his Symtuza and Bactrim every single day without any missed doses.  He tells me that he has moved back in with his sister which has been very helpful to hold him accountable to take his medicine every day.  No concern with anxiety or depression.  He is sleeping and eating well.  No sexual encounters or partner since HIV diagnosis.  His main concern today is a rash on bilateral cheeks near his mouth.  He estimates this is been present for nearly 3 weeks now.  He feels that it is improving with mupirocin cream and Vaseline applications.  He describes it to be very itchy and is constantly scratching at it.  He wonders if this is possibly due to drying effect of mask use.  He has no drainage to any of these areas and states that they are drying up. These areas are not currently  and have never been painful. He has no rashes in his mouth or scalp or on periphery.    Review of Systems  Constitutional: Negative for chills, fever, malaise/fatigue and weight loss.  HENT: Negative for congestion and sore throat.   Eyes: Negative for double vision.  Respiratory: Negative for cough, sputum production and shortness of breath.   Cardiovascular: Negative.   Gastrointestinal: Negative for abdominal pain, diarrhea and vomiting.  Genitourinary: Negative.   Musculoskeletal: Negative for joint pain, myalgias and neck pain.  Skin: Positive for rash. Negative for itching.  Neurological: Negative for weakness and headaches.  Psychiatric/Behavioral: Negative for depression and substance abuse. The patient is not nervous/anxious.     Past Medical History:  Diagnosis Date   Eczema    HIV (human immunodeficiency virus infection) (HCC)    Shingles outbreak 09/26/2018    Social History   Tobacco Use   Smoking status: Light Tobacco Smoker    Packs/day: 0.10    Types: Cigarettes   Smokeless tobacco: Never Used  Substance Use Topics   Alcohol use: No    Comment: occ   Drug use: Yes    Types: Marijuana   He  reports being sexually active and has had partner(s) who are Male and Male. He reports using the following method of birth control/protection: Condom.  No Known Allergies   Outpatient Medications Prior to Visit  Medication Sig Dispense  Refill   Darunavir-Cobicisctat-Emtricitabine-Tenofovir Alafenamide (SYMTUZA) 800-150-200-10 MG TABS Take 1 tablet by mouth daily with breakfast. 30 tablet 5   hydrocortisone 2.5 % cream Apply topically 2 (two) times daily. To anterior chest 30 g 3   sulfamethoxazole-trimethoprim (BACTRIM DS) 800-160 MG tablet TAKE 1 TABLET BY MOUTH DAILY. 30 tablet 5   valACYclovir (VALTREX) 1000 MG tablet Take 1 tablet (1,000 mg total) by mouth daily. 30 tablet 3   fluconazole (DIFLUCAN) 100 MG tablet Take 1 tablet (100 mg total) by mouth  daily. 7 tablet 0   mupirocin cream (BACTROBAN) 2 % Apply 1 application topically 2 (two) times daily. 15 g 0   mupirocin ointment (BACTROBAN) 2 % APPLY 1 APPLICATION TOPICALLY 2 (TWO) TIMES DAILY. 22 g 5   No facility-administered medications prior to visit.     Objective: Vitals:   04/18/19 1435  BP: 111/74  Pulse: 85  Temp: 97.9 F (36.6 C)   Physical Exam  Constitutional: He is well-developed, well-nourished, and in no distress.  HENT:  Mouth/Throat: No oropharyngeal exudate.  Cardiovascular: Normal rate.  Pulmonary/Chest: Effort normal.  Skin: Skin is warm and dry.  Rash as pictured below to bilateral cheeks and left upper cheekbone         Lab Results Lab Results  Component Value Date   WBC 2.7 (L) 07/14/2018   HGB 13.1 (L) 07/14/2018   HCT 40.0 07/14/2018   MCV 86.8 07/14/2018   PLT 79 (L) 07/14/2018    Lab Results  Component Value Date   CREATININE 1.18 05/16/2018   BUN 16 05/16/2018   NA 143 05/16/2018   K 3.9 05/16/2018   CL 107 05/16/2018   CO2 29 05/16/2018    Lab Results  Component Value Date   ALT 12 05/16/2018   AST 20 05/16/2018   BILITOT 0.4 05/16/2018    HIV 1 RNA Quant (copies/mL)  Date Value  03/16/2019 2,820 (H)  12/27/2018 113,000 (H)  12/27/2018 110,000 (H)   CD4 T Cell Abs (/uL)  Date Value  03/16/2019 132 (L)  12/27/2018 30 (L)  07/14/2018 70 (L)    Assessment and Plan: Problem List Items Addressed This Visit      Unprioritized   AIDS (acquired immune deficiency syndrome) ([place]) - Primary    He is starting to show some reconstitution based on lab work 1 month ago CD4 count above 100.  We will repeat today and have him continue his Bactrim for another 3 months at least. No signs of opportunistic infection on my exam today. He does have chronic ongoing skin changes consistent with atopic dermatitis.  May be partially related to IRIS given his 3 fold increase in a short time on antiretroviral therapy.      Relevant  Medications   mupirocin ointment (BACTROBAN) 2 %   Other Relevant Orders   HIV-1 RNA quant-no reflex-bld   T-helper cell (CD4)- ([facility] clinic only)   RPR   Pneumococcal conjugate vaccine 13-valent IM (Completed)   HPV 9-valent vaccine,Recombinat (Completed)   History of syphilis    RPR screen today      Noncompliance with medication regimen    We will continue to have him follow with myself and our pharmacy team for adherence check-in's every 1 to 2 months.  Likely this will improve now that he is living back with his sister again.      HIV disease ([place])    Continue Symtuza with food once daily. We will update his vaccines to include Prevnar  and his final HPV series today.  We will vaccinate for influenza at his upcoming office visit in late September.      Relevant Medications   mupirocin ointment (BACTROBAN) 2 %   Facial rash    The facial rash seems to be most consistent with atopic dermatitis.  No signs of superinfection today. I wonder if his mask uses making his face drier.  I have asked him to use Vaseline or Cetaphil to his face multiple times per day.  Avoid scratching and touching.  I will refill his mupirocin cream but have asked him to use over-the-counter hydrocortisone 1-2 times a day for a week to see if this helps. Referral to dermatology if this does not resolve.       Other Visit Diagnoses    Need for vaccination with 13-polyvalent pneumococcal conjugate vaccine       Relevant Orders   Pneumococcal conjugate vaccine 13-valent IM (Completed)      Rexene AlbertsStephanie Ladarrian Asencio, MSN, NP-C [facility]  [email or web address] Pager: [phone] Office: [phone] [facility] Main Line: [phone]  04/18/2019

## 2019-04-19 LAB — T-HELPER CELL (CD4) - (RCID CLINIC ONLY)
CD4 % Helper T Cell: 11 % — ABNORMAL LOW (ref 33–65)
CD4 T Cell Abs: 45 /uL — ABNORMAL LOW (ref 400–1790)

## 2019-04-23 LAB — HIV-1 RNA QUANT-NO REFLEX-BLD
HIV 1 RNA Quant: 795000 copies/mL — ABNORMAL HIGH
HIV-1 RNA Quant, Log: 5.9 Log copies/mL — ABNORMAL HIGH

## 2019-04-23 LAB — FLUORESCENT TREPONEMAL AB(FTA)-IGG-BLD: Fluorescent Treponemal ABS: REACTIVE — AB

## 2019-04-23 LAB — RPR: RPR Ser Ql: REACTIVE — AB

## 2019-04-23 LAB — RPR TITER: RPR Titer: 1:4 {titer} — ABNORMAL HIGH

## 2019-04-24 ENCOUNTER — Telehealth: Payer: Self-pay | Admitting: Infectious Diseases

## 2019-04-24 NOTE — Telephone Encounter (Signed)
I called and spoke with Aaron Lee about his lab results from 6 days ago. I informed him that he is still missing way too many doses of his medications. He tells me that he has a hard time to remember this every day and asked if we could call him everyday to remind him. Our last visit together I put an alarm reminder on his phone for him to try to help.   I told Aaron Lee today that I am worry that he will die if he cannot get this under better control.   I requested that he have his sister and mother call our clinic (I attempted to call Mom at the listed number to inform about concern as she is his legal guardian however no response on the line). He is living with his sister Aaron Lee) currently and I am hopeful can help keep him accountable to take medicines every day.   Will route to [place] and [place] in [place] clinic to see how else we can help Aaron Lee.

## 2019-04-25 ENCOUNTER — Telehealth: Payer: Self-pay | Admitting: Infectious Diseases

## 2019-04-25 NOTE — Telephone Encounter (Signed)
Discussed Aaron Lee's lab work at the time of his visit last week with Aaron Lee,Aaron Lee.  I expressed my concern that he is unable to take his medications regularly and needs witness daily check ins for now to help. She tells me that [name] has a part time job now and is now living with his sister who in the past was very helpful getting him to take his medications every day.   My request to her was to help [name] by choosing a time that works for them both every day and have him take medications in front of her since he cannot be trusted to do this on his own. Verified that he is taking Symtuza (larger yellow pill) and Bactrim (oblong white pill) every single day together with food. She states that she believes this will get better now that she is back with him.   He has follow up with our ID pharmacist Cassie in 6 weeks to reassess again. Given his compliance with visits he would be a good candidate in the future for IM treatments every 1-2 months. To preserve this option I will for now keep him off integrase inhibitor class and continue PI therapy for now.    Janene Madeira, MSN, NP-C [facility] for Infectious Disease [email or web address] Pager: [phone] Office: [phone] [place]: [phone]

## 2019-04-25 NOTE — Telephone Encounter (Signed)
He does certainly keep appointments. I am not certain what motivates him exactly. He is hard to read sometimes - I know he likes talking to me and [name] because "we listen to him." He has some tension with his dad apparently (he came one time and that did NOT go well and Tommey just shut down completely). Mom I have only seen once; she was previously trying to go see him everyday to ensure med compliance but now she won't answer calls.   I would be happy to refer him for the study - he has been in the past suppressed on Biktarvy but not the 6 month criteria. I wonder if working with [employer] will help him though... I will loop Kim into the conversation.

## 2019-04-25 NOTE — Telephone Encounter (Signed)
What do you think motivates him?  He seems to keep appointments - is there some kind of incentive that he could work towards? Do you think maybe he would be appropriate for the study [employer] about?

## 2019-04-28 NOTE — Telephone Encounter (Signed)
I would be happy to help! I do wonder if the study would work since we can offer money for him taking his medications. Do we have a number for his sister? I would love to speak with her! With consent of course!!

## 2019-05-01 NOTE — Progress Notes (Signed)
Oh nooooo

## 2019-05-01 NOTE — Telephone Encounter (Signed)
He mentioned that she did not have a phone number. Mom called me back last week so she is aware also. Maybe mom can get [place] some more clarification about his sister's contact information.

## 2019-05-03 ENCOUNTER — Telehealth: Payer: Self-pay | Admitting: *Deleted

## 2019-05-03 ENCOUNTER — Other Ambulatory Visit: Payer: Medicaid Other | Admitting: *Deleted

## 2019-05-03 NOTE — Telephone Encounter (Signed)
Thank you so very much for your continual support of our patients! They are all lucky to have you.

## 2019-05-03 NOTE — Telephone Encounter (Signed)
Call placed to [facility] to offer assistance with medication management. I offered daily text message reminders to [facility] to assist with medication management. He definitely feels it will work. Currently he is at work but stated he will text me his current address so we can arrange a home visit for re-initiation of visits/assistance.

## 2019-05-03 NOTE — Telephone Encounter (Signed)
I can't get this guy off my mind!! [place], if he says daily reminders will help him with adherence I am willing to text him for 21 days straight with medication reminders.

## 2019-05-03 NOTE — Telephone Encounter (Signed)
He would probably love you for that while we work on a more durable, independent option :)  Please give him a call! You are fabulous by the way.

## 2019-05-03 NOTE — Telephone Encounter (Signed)
More like crazy, but I really love our patients. I want to give him every option we have to be successful. Calling him now

## 2019-05-04 ENCOUNTER — Telehealth: Payer: Self-pay | Admitting: *Deleted

## 2019-05-04 NOTE — Telephone Encounter (Signed)
Called and text Evyn today asking if he can give me the address for a home visit. We need to sign consents to re-establish care. At this time I have not received a call or response to the text message

## 2019-05-08 NOTE — Telephone Encounter (Signed)
You ladies are so wonderful!

## 2019-05-23 MED FILL — SYMTUZA 800-150-200-10 MG T: 800-150-200 | 30 days supply | Qty: 30 | Fill #3

## 2019-05-23 MED FILL — SULFAMETHOXAZOLE-TMP DS TAB: 800-160 | 30 days supply | Qty: 30 | Fill #1

## 2019-06-19 ENCOUNTER — Other Ambulatory Visit: Payer: Self-pay

## 2019-06-19 ENCOUNTER — Ambulatory Visit (INDEPENDENT_AMBULATORY_CARE_PROVIDER_SITE_OTHER): Payer: Medicaid Other | Admitting: Pharmacist

## 2019-06-19 DIAGNOSIS — Z23 Encounter for immunization: Secondary | ICD-10-CM

## 2019-06-19 DIAGNOSIS — B20 Human immunodeficiency virus [HIV] disease: Secondary | ICD-10-CM | POA: Diagnosis not present

## 2019-06-19 NOTE — Progress Notes (Signed)
HPI: Aaron Lee is a 28 y.o. male who presents to the [place] [facility] for HIV follow-up.  Patient Active Problem List   Diagnosis Date Noted  . Facial rash 04/18/2019  . Noncompliance with medication regimen 02/23/2019  . HIV disease ([place]) 02/23/2019  . History of shingles 09/26/2018  . Cognitive developmental delay 10/01/2017  . History of syphilis 08/26/2017  . Anemia 08/02/2017  . Healthcare maintenance 08/02/2017  . AIDS (acquired immune deficiency syndrome) ([place]) 07/26/2017  . Seasonal allergic rhinitis 03/31/2017    Patient's Medications  New Prescriptions   No medications on file  Previous Medications   DARUNAVIR-COBICISCTAT-EMTRICITABINE-TENOFOVIR ALAFENAMIDE (SYMTUZA) 800-150-200-10 MG TABS    Take 1 tablet by mouth daily with breakfast.   HYDROCORTISONE 2.5 % CREAM    Apply topically 2 (two) times daily. To anterior chest   MUPIROCIN OINTMENT (BACTROBAN) 2 %    Apply 1 application topically 2 (two) times daily.   SULFAMETHOXAZOLE-TRIMETHOPRIM (BACTRIM DS) 800-160 MG TABLET    TAKE 1 TABLET BY MOUTH DAILY.   VALACYCLOVIR (VALTREX) 1000 MG TABLET    Take 1 tablet (1,000 mg total) by mouth daily.  Modified Medications   No medications on file  Discontinued Medications   No medications on file    Allergies: No Known Allergies  Past Medical History: Past Medical History:  Diagnosis Date  . Eczema   . HIV (human immunodeficiency virus infection) ([place])   . Shingles outbreak 09/26/2018    Social History: Social History   Socioeconomic History  . Marital status: Single    Spouse name: Not on file  . Number of children: Not on file  . Years of education: Not on file  . Highest education level: Not on file  Occupational History  . Not on file  Social Needs  . Financial resource strain: Not on file  . Food insecurity    Worry: Not on file    Inability: Not on file  . Transportation needs    Medical: Not on file    Non-medical: Not on file   Tobacco Use  . Smoking status: Light Tobacco Smoker    Packs/day: 0.10    Types: Cigarettes  . Smokeless tobacco: Never Used  Substance and Sexual Activity  . Alcohol use: No    Comment: occ  . Drug use: Yes    Types: Marijuana  . Sexual activity: Yes    Partners: Female, Male    Birth control/protection: Condom  Lifestyle  . Physical activity    Days per week: Not on file    Minutes per session: Not on file  . Stress: Not on file  Relationships  . Social Herbalist on phone: Not on file    Gets together: Not on file    Attends religious service: Not on file    Active member of club or organization: Not on file    Attends meetings of clubs or organizations: Not on file    Relationship status: Not on file  Other Topics Concern  . Not on file  Social History Narrative  . Not on file    Labs: Lab Results  Component Value Date   HIV1RNAQUANT 795,000 (H) 04/18/2019   HIV1RNAQUANT 2,820 (H) 03/16/2019   HIV1RNAQUANT 113,000 (H) 12/27/2018   CD4TABS 50 (L) 06/19/2019   CD4TABS 45 (L) 04/18/2019   CD4TABS 132 (L) 03/16/2019    RPR and STI Lab Results  Component Value Date   LABRPR REACTIVE (A) 04/18/2019  LABRPR REACTIVE (A) 12/27/2018   LABRPR REACTIVE (A) 07/14/2018   LABRPR REACTIVE (A) 02/28/2018   LABRPR REACTIVE (A) 11/11/2017   RPRTITER 1:4 (H) 04/18/2019   RPRTITER 1:8 (H) 12/27/2018   RPRTITER 1:4 (H) 07/14/2018   RPRTITER 1:8 (H) 02/28/2018   RPRTITER 1:16 (H) 11/11/2017    STI Results GC CT  08/02/2017 **POSITIVE**(A) Negative  08/02/2017 **POSITIVE**(A) Negative    Hepatitis B Lab Results  Component Value Date   HEPBSAB REACTIVE (A) 08/02/2017   HEPBSAG NON-REACTIVE 08/02/2017   HEPBCAB NON-REACTIVE 08/02/2017   Hepatitis C Lab Results  Component Value Date   HEPCAB NON-REACTIVE 08/02/2017   Hepatitis A Lab Results  Component Value Date   HAV REACTIVE (A) 08/02/2017   Lipids: No results found for: CHOL, TRIG, HDL,  CHOLHDL, VLDL, LDLCALC  Current HIV Regimen: Symtuza  Assessment: Aaron Lee is here today to follow-up for his HIV infection and adherence issues.  Judeth Cornfield and I have been seeing him every 4-6 weeks routinely as his adherence to his ART regimen is suboptimal.  He told Judeth Cornfield at his last visit in July that he was taking his Symtuza nearly everyday but his viral load at that time was 795,000. I probed a bit today and he admits to maybe taking it 4-5 times per week.  He states that last week he took it every day but the weeks prior to that, he missed at least 2 days.  I set an alarm on his phone for 9:05am to go off every day to remind him to take his medications.  He states that he gets up at 9am for work every morning.  He states that he takes the Bactrim as well. He is no longer living with his sister and is back in his apartment living alone.  He had a bad rash on his face that Judeth Cornfield diagnosed as atopic dermatitis during his last visit (see her note for pictures). He used lotion on these patches and they have completely resolved.  No further scabs or issues with that today.  No headaches, night sweats, chest pain, SOB, sore throat, or stomach issues. No signs or symptoms of any opportunistic infections today.  I spent time today being realistic with his long term prognosis. He asked me about his friend that he said had HIV and AIDS and I bluntly told him that he also has AIDS as his CD4 hasn't been above 200 since 2018, with his most recent being 50.  I explained what AIDS is and how his body is very susceptible to fungal, bacteria, and viral infections.  I told him that he may not be here in 5 years or even less if he continues down this road.  He seemed a little worried about this and then promised me that his numbers would be better when he comes to see [place].  I really hope they are.  I will check labs today again with resistance.  I will also give him a flu shot. I'll have him follow up  with Judeth Cornfield in 6 weeks.  Plan: - Continue Symtuza PO once daily with food - HIV RNA with reflex to resistance and CD4 count today - Flu shot - F/u with Judeth Cornfield 11/10 at 245pm  Cassie L. Kuppelweiser, PharmD, BCIDP, AAHIVP, CPP Infectious Diseases Clinical Pharmacist [facility] 06/20/2019, 4:38 PM

## 2019-06-20 LAB — T-HELPER CELL (CD4) - (RCID CLINIC ONLY)
CD4 % Helper T Cell: 9 % — ABNORMAL LOW (ref 33–65)
CD4 T Cell Abs: 50 /uL — ABNORMAL LOW (ref 400–1790)

## 2019-06-22 MED FILL — SULFAMETHOXAZOLE-TMP DS TAB: 800-160 | 30 days supply | Qty: 30 | Fill #2

## 2019-06-22 MED FILL — SYMTUZA 800-150-200-10 MG T: 800-150-200 | 30 days supply | Qty: 30 | Fill #4

## 2019-07-04 LAB — HIV RNA, RTPCR W/R GT (RTI, PI,INT)
HIV 1 RNA Quant: 167000 copies/mL — ABNORMAL HIGH
HIV-1 RNA Quant, Log: 5.22 Log copies/mL — ABNORMAL HIGH

## 2019-07-04 LAB — HIV-1 GENOTYPE: HIV-1 Genotype: DETECTED — AB

## 2019-07-04 LAB — HIV-1 INTEGRASE GENOTYPE

## 2019-07-04 NOTE — Progress Notes (Signed)
It's amazing he doesn't have resistance.

## 2019-07-19 MED FILL — SULFAMETHOXAZOLE-TMP DS TAB: 800-160 | 30 days supply | Qty: 30 | Fill #3

## 2019-07-19 MED FILL — SYMTUZA 800-150-200-10 MG T: 800-150-200 | 30 days supply | Qty: 30 | Fill #5

## 2019-08-01 ENCOUNTER — Other Ambulatory Visit: Payer: Self-pay

## 2019-08-01 ENCOUNTER — Ambulatory Visit (INDEPENDENT_AMBULATORY_CARE_PROVIDER_SITE_OTHER): Payer: Medicaid Other | Admitting: Infectious Diseases

## 2019-08-01 ENCOUNTER — Encounter: Payer: Self-pay | Admitting: Infectious Diseases

## 2019-08-01 VITALS — BP 127/86 | HR 87 | Temp 98.1°F | Wt 106.0 lb

## 2019-08-01 DIAGNOSIS — Z9114 Patient's other noncompliance with medication regimen: Secondary | ICD-10-CM

## 2019-08-01 DIAGNOSIS — Z8619 Personal history of other infectious and parasitic diseases: Secondary | ICD-10-CM

## 2019-08-01 DIAGNOSIS — B20 Human immunodeficiency virus [HIV] disease: Secondary | ICD-10-CM

## 2019-08-01 NOTE — Progress Notes (Signed)
Name: Aaron HeldMarcus D Porcaro  DOB: 09/13/1991  MRN: [id number]  PCP: Katherine RoanMacLean, Matthew, MD    Patient Active Problem List   Diagnosis Date Noted  . HIV disease (HCC) 02/23/2019    Priority: High  . AIDS (acquired immune deficiency syndrome) (HCC) 07/26/2017    Priority: High  . Noncompliance with medication regimen 02/23/2019  . History of shingles 09/26/2018  . Cognitive developmental delay 10/01/2017  . History of syphilis 08/26/2017  . Anemia 08/02/2017  . Healthcare maintenance 08/02/2017  . Seasonal allergic rhinitis 03/31/2017     SUBJECTIVE: Aaron Lee is a 28 y.o. AA male with HIV diagnosed in November 2018 with CD4 nadir < 10, VL 192,000 copies.  HIV Risk: bisexual.  OI Hx: shingles  Previous Regimens:   Biktarvy --> suppressed (changed to PI for adherence concern)  Symtuza 2020  Genotype:   07/2017 - K103S (R-nevirapine)   CC:  Aaron Lee is here today for routine follow up on HIV and adherence counseling.    HPI/ROS:  Aaron Lee has been well since our last visit. No significant illnesses or hospitalizations. He is taking his HIV medications off and on still despite all attempts to keep him adherent. He states that he just forgets and is probably taking it 3 days a week. He got a new phone and no longer has alarm set for pill reminder. 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 He was working with Molli PoseyAmbre but got a new phone number and phone again so has not been able to reach her since.    Review of Systems  Constitutional: Negative for appetite change, chills, fatigue, fever and unexpected weight change.  Eyes: Negative for visual disturbance.  Respiratory: Negative for cough and shortness of breath.   Cardiovascular: Negative for chest pain and leg swelling.  Gastrointestinal: Negative for abdominal pain, diarrhea and nausea.  Genitourinary: Negative for discharge, dysuria and genital sores.  Musculoskeletal: Negative for joint swelling.   Skin: Negative for color change and rash.  Neurological: Negative for dizziness and headaches.  Hematological: Negative for adenopathy.  Psychiatric/Behavioral: Negative for sleep disturbance. The patient is not nervous/anxious.      Past Medical History:  Diagnosis Date  . Eczema   . HIV (human immunodeficiency virus infection) (HCC)   . Shingles outbreak 09/26/2018    Social History   Tobacco Use  . Smoking status: Light Tobacco Smoker    Packs/day: 0.10    Types: Cigarettes  . Smokeless tobacco: Never Used  Substance Use Topics  . Alcohol use: No    Comment: occ  . Drug use: Yes    Types: Marijuana   He  reports being sexually active and has had partner(s) who are Male and Male. He reports using the following method of birth control/protection: Condom.  No Known Allergies   Outpatient Medications Prior to Visit  Medication Sig Dispense Refill  . Darunavir-Cobicisctat-Emtricitabine-Tenofovir Alafenamide (SYMTUZA) 800-150-200-10 MG TABS Take 1 tablet by mouth daily with breakfast. 30 tablet 5  . hydrocortisone 2.5 % cream Apply topically 2 (two) times daily. To anterior chest 30 g 3  . mupirocin ointment (BACTROBAN) 2 % Apply 1 application topically 2 (two) times daily. 22 g 0  . sulfamethoxazole-trimethoprim (BACTRIM DS) 800-160 MG tablet TAKE 1 TABLET BY MOUTH DAILY. 30 tablet 5  . valACYclovir (VALTREX) 1000 MG tablet Take 1 tablet (1,000 mg total) by mouth daily. 30 tablet 3   No facility-administered medications prior to visit.  Objective: Vitals:   08/01/19 1451  BP: 127/86  Pulse: 87  Temp: 98.1 F (36.7 C)    Physical Exam Vitals signs reviewed.  Constitutional:      Appearance: He is well-developed.     Comments: Seated comfortably in chair during visit. Thin but well developed.   HENT:     Mouth/Throat:     Dentition: Normal dentition. No dental abscesses.  Cardiovascular:     Rate and Rhythm: Normal rate and regular rhythm.     Heart  sounds: Normal heart sounds.  Pulmonary:     Effort: Pulmonary effort is normal.     Breath sounds: Normal breath sounds.  Abdominal:     General: There is no distension.     Palpations: Abdomen is soft.     Tenderness: There is no abdominal tenderness.  Lymphadenopathy:     Cervical: No cervical adenopathy.  Skin:    General: Skin is warm and dry.     Findings: No rash.  Neurological:     Mental Status: He is alert and oriented to person, place, and time.  Psychiatric:        Mood and Affect: Mood normal.        Behavior: Behavior normal.        Judgment: Judgment normal.      Lab Results Lab Results  Component Value Date   WBC 2.7 (L) 07/14/2018   HGB 13.1 (L) 07/14/2018   HCT 40.0 07/14/2018   MCV 86.8 07/14/2018   PLT 79 (L) 07/14/2018    Lab Results  Component Value Date   CREATININE 1.18 05/16/2018   BUN 16 05/16/2018   NA 143 05/16/2018   K 3.9 05/16/2018   CL 107 05/16/2018   CO2 29 05/16/2018    Lab Results  Component Value Date   ALT 12 05/16/2018   AST 20 05/16/2018   BILITOT 0.4 05/16/2018    HIV 1 RNA Quant (copies/mL)  Date Value  06/19/2019 167,000 (H)  04/18/2019 795,000 (H)  03/16/2019 2,820 (H)   CD4 T Cell Abs (/uL)  Date Value  08/01/2019 <35 (L)  06/19/2019 50 (L)  04/18/2019 45 (L)    Assessment and Plan: Problem List Items Addressed This Visit      High   AIDS (acquired immune deficiency syndrome) ([place]) - Primary (Chronic)    No findings c/w opportunistic process today. Continue bactrim prophylaxis. May need to start azithromycin once a week for MAI proph since he is chronically with low CD4 < 50 now.         Relevant Orders   HIV 1 RNA quant-no reflex-bld   T-helper cell (CD4)- ([facility] clinic only) (Completed)   HIV disease ([place]) (Chronic)    Will continue Symtuza, bactrim. Lujean Rave and I again put two alarms on his phone and showed him how to fill his pill box. I will again try to re-engage him with [name]. Next step  may be to have him come once a week to have [place] fill his pill box.   He will again follow up in 1 month.         Unprioritized   History of syphilis    Repeated RPRs with appropriate serofast response. Will screen 2x/year      Noncompliance with medication regimen    I have ongoing concerns about [place] and his inability to handle taking his medications as prescribed. I think he is taking them way less than he states given his high viral  loads. I recently ran resistance assay on him and he surprisingly has no significant mutations and only revealed wildtype virus. Despite monthly adherence visits he is unable to connect what happens when he does not take his medications regularly with his health and overall risk for severe infection, cancer, organ damage and eventual death due to his condition despite multiple attempts myself, pharmacy, community nurse reinforcement.  I have discussed with his mother on several occasions however he continues to have terrible adherence. He is no longer living with his sister which is the biggest detriment to his health. I have asked him to give consideration to move back in with her or his mother to help save his life. He is at this time unwilling.           Rexene Alberts, MSN, NP-C [facility]  [email or web address] Pager: [phone] Office: [phone] [facility] Main Line: [phone]  08/02/2019

## 2019-08-01 NOTE — Patient Instructions (Addendum)
It is nice to see you!  To help you get your medications every day -   1. We set a phone alarm for you every day at 8:00 a.m. to take your medication --> as soon as this goes off I want you to take your pills (one white and one gold/yellow).   2. There is a back up alarm at 1 pm for you incase you forget in the morning  3. I want you to fill your pill box every week and carry this with you to take your medication so you don't accidentally take it twice.    Aaron Lee will see you back in 1 month

## 2019-08-02 ENCOUNTER — Encounter: Payer: Self-pay | Admitting: Infectious Diseases

## 2019-08-02 LAB — T-HELPER CELL (CD4) - (RCID CLINIC ONLY)
CD4 % Helper T Cell: 8 % — ABNORMAL LOW (ref 33–65)
CD4 T Cell Abs: <35 /uL — ABNORMAL LOW (ref 400–1790)

## 2019-08-02 NOTE — Assessment & Plan Note (Signed)
Will continue Symtuza, bactrim. Lujean Rave and I again put two alarms on his phone and showed him how to fill his pill box. I will again try to re-engage him with [name]. Next step may be to have him come once a week to have [place] fill his pill box.   He will again follow up in 1 month.

## 2019-08-02 NOTE — Assessment & Plan Note (Signed)
I have ongoing concerns about Aaron Lee and his inability to handle taking his medications as prescribed. I think he is taking them way less than he states given his high viral loads. I recently ran resistance assay on him and he surprisingly has no significant mutations and only revealed wildtype virus. Despite monthly adherence visits he is unable to connect what happens when he does not take his medications regularly with his health and overall risk for severe infection, cancer, organ damage and eventual death due to his condition despite multiple attempts myself, pharmacy, community nurse reinforcement.  I have discussed with his mother on several occasions however he continues to have terrible adherence. He is no longer living with his sister which is the biggest detriment to his health. I have asked him to give consideration to move back in with her or his mother to help save his life. He is at this time unwilling.

## 2019-08-02 NOTE — Progress Notes (Signed)
CD4 < 35. Will start Azithromycin for MAI proph given his poor adherence.

## 2019-08-02 NOTE — Assessment & Plan Note (Signed)
Repeated RPRs with appropriate serofast response. Will screen 2x/year

## 2019-08-02 NOTE — Assessment & Plan Note (Addendum)
No findings c/w opportunistic process today. Continue bactrim prophylaxis. May need to start azithromycin once a week for MAI proph since he is chronically with low CD4 < 50 now.

## 2019-08-04 LAB — HIV-1 RNA QUANT-NO REFLEX-BLD
HIV 1 RNA Quant: 311000 copies/mL — ABNORMAL HIGH
HIV-1 RNA Quant, Log: 5.49 Log copies/mL — ABNORMAL HIGH

## 2019-08-11 ENCOUNTER — Other Ambulatory Visit: Payer: Self-pay | Admitting: Pharmacist

## 2019-08-11 DIAGNOSIS — B20 Human immunodeficiency virus [HIV] disease: Secondary | ICD-10-CM

## 2019-08-15 MED FILL — SULFAMETHOXAZOLE-TMP DS TAB: 800-160 | 30 days supply | Qty: 30 | Fill #4

## 2019-08-15 MED FILL — SYMTUZA 800-150-200-10 MG T: 800-150-200 | 30 days supply | Qty: 30 | Fill #0

## 2019-08-31 ENCOUNTER — Ambulatory Visit (INDEPENDENT_AMBULATORY_CARE_PROVIDER_SITE_OTHER): Payer: Medicaid Other | Admitting: Infectious Diseases

## 2019-08-31 ENCOUNTER — Other Ambulatory Visit: Payer: Self-pay

## 2019-08-31 ENCOUNTER — Encounter: Payer: Self-pay | Admitting: Infectious Diseases

## 2019-08-31 VITALS — BP 129/82 | HR 96 | Temp 98.0°F | Ht 63.0 in | Wt 107.0 lb

## 2019-08-31 DIAGNOSIS — L2082 Flexural eczema: Secondary | ICD-10-CM | POA: Diagnosis not present

## 2019-08-31 DIAGNOSIS — B20 Human immunodeficiency virus [HIV] disease: Secondary | ICD-10-CM | POA: Diagnosis not present

## 2019-08-31 DIAGNOSIS — B999 Unspecified infectious disease: Secondary | ICD-10-CM

## 2019-08-31 MED ORDER — HYDROCORTISONE 2.5 % EX CREA: TOPICAL_CREAM | Freq: Two times a day (BID) | CUTANEOUS | 3 refills | Status: DC

## 2019-08-31 MED FILL — HYDROCORTISONE 2.5% CREAM: 2.5 | 10 days supply | Qty: 30 | Fill #0

## 2019-08-31 NOTE — Assessment & Plan Note (Signed)
No findings concerning for OI on exam today. Continue bactrim once a day with symtuza. While he should be taking azithromycin given his persistently low CD4 I am worried this will complicate things for him - will continue to focus on ARV and PJP/Toxo proph for now.

## 2019-08-31 NOTE — Progress Notes (Signed)
Name: Aaron Lee  DOB: 1991-07-30  MRN: [id number]  PCP: Jeanmarie Hubert, MD    Patient Active Problem List   Diagnosis Date Noted  . HIV disease ([place]) 02/23/2019    Priority: High  . AIDS (acquired immune deficiency syndrome) ([place]) 07/26/2017    Priority: High  . Noncompliance with medication regimen 02/23/2019  . History of shingles 09/26/2018  . Cognitive developmental delay 10/01/2017  . History of syphilis 08/26/2017  . Anemia 08/02/2017  . Healthcare maintenance 08/02/2017  . Eczema 03/31/2017  . Seasonal allergic rhinitis 03/31/2017     SUBJECTIVE: Aaron Lee is a 28 y.o. AA male with HIV diagnosed in November 2018 with CD4 nadir < 10, VL 192,000 copies.  HIV Risk: bisexual.  OI Hx: shingles  Previous Regimens:   Biktarvy --> suppressed (changed to PI for adherence concern)  Symtuza 2020  Genotype:   07/2017 - K103S (R-nevirapine)   CC:  Routine follow up on HIV and adherence counseling.    HPI/ROS:  Since our last visit his mother has helped organize daily video chats to help Aaron Lee take his HIV medications. He tells me that she has been helping do this for nearly 3 weeks now. He is taking the "big yellow pill and smaller white pill" once a day.   Only complaint today is increased eczema flare over the right AC fossa. He requests lotion that he can use for itching.  Otherwise he reports no complaints today suggestive of associated opportunistic infection or advancing HIV disease such as fevers, night sweats, weight loss, anorexia, cough, SOB, nausea, vomiting, diarrhea, headache, sensory changes, lymphadenopathy or oral thrush.    Review of Systems  Constitutional: Negative for appetite change, chills, fatigue, fever and unexpected weight change.  Eyes: Negative for visual disturbance.  Respiratory: Negative for cough and shortness of breath.   Cardiovascular: Negative for chest pain and leg swelling.  Gastrointestinal: Negative for  abdominal pain, diarrhea and nausea.  Genitourinary: Negative for discharge, dysuria and genital sores.  Musculoskeletal: Negative for joint swelling.  Skin: Positive for rash. Negative for color change.  Neurological: Negative for dizziness and headaches.  Hematological: Negative for adenopathy.  Psychiatric/Behavioral: Negative for sleep disturbance. The patient is not nervous/anxious.      Past Medical History:  Diagnosis Date  . Eczema   . HIV (human immunodeficiency virus infection) ([place])   . Shingles outbreak 09/26/2018    Social History   Tobacco Use  . Smoking status: Light Tobacco Smoker    Packs/day: 0.10    Types: Cigarettes  . Smokeless tobacco: Never Used  Substance Use Topics  . Alcohol use: No    Comment: occ  . Drug use: Yes    Frequency: 7.0 times per week    Types: Marijuana   He  reports being sexually active and has had partner(s) who are Male and Male. He reports using the following method of birth control/protection: Condom.  No Known Allergies   Outpatient Medications Prior to Visit  Medication Sig Dispense Refill  . sulfamethoxazole-trimethoprim (BACTRIM DS) 800-160 MG tablet TAKE 1 TABLET BY MOUTH DAILY. 30 tablet 5  . SYMTUZA 800-150-200-10 MG TABS TAKE 1 TABLET BY MOUTH DAILY WITH BREAKFAST. 30 tablet 5  . valACYclovir (VALTREX) 1000 MG tablet Take 1 tablet (1,000 mg total) by mouth daily. 30 tablet 3  . hydrocortisone 2.5 % cream Apply topically 2 (two) times daily. To anterior chest (Patient not taking: Reported on 08/31/2019) 30 g 3  . mupirocin  ointment (BACTROBAN) 2 % Apply 1 application topically 2 (two) times daily. (Patient not taking: Reported on 08/31/2019) 22 g 0   No facility-administered medications prior to visit.           Objective: Vitals:   08/31/19 1503  BP: 129/82  Pulse: 96  Temp: 98 F (36.7 C)    Physical Exam Constitutional:      Appearance: He is well-developed.     Comments: Seated comfortably in chair  during visit.   HENT:     Mouth/Throat:     Dentition: Normal dentition. No dental abscesses.  Cardiovascular:     Rate and Rhythm: Normal rate and regular rhythm.     Heart sounds: Normal heart sounds.  Pulmonary:     Effort: Pulmonary effort is normal.     Breath sounds: Normal breath sounds.  Abdominal:     General: There is no distension.     Palpations: Abdomen is soft.     Tenderness: There is no abdominal tenderness.  Lymphadenopathy:     Cervical: No cervical adenopathy.  Skin:    General: Skin is warm and dry.     Findings: No rash.     Comments: Patches of raised inflamed skin overlying right antecubital fossa with scattered scabbed excoriations. Dry skin.   Neurological:     Mental Status: He is alert and oriented to person, place, and time.  Psychiatric:        Judgment: Judgment normal.     Comments: In good spirits today and engaged in care discussion.     Lab Results Lab Results  Component Value Date   WBC 2.7 (L) 07/14/2018   HGB 13.1 (L) 07/14/2018   HCT 40.0 07/14/2018   MCV 86.8 07/14/2018   PLT 79 (L) 07/14/2018    Lab Results  Component Value Date   CREATININE 1.18 05/16/2018   BUN 16 05/16/2018   NA 143 05/16/2018   K 3.9 05/16/2018   CL 107 05/16/2018   CO2 29 05/16/2018    Lab Results  Component Value Date   ALT 12 05/16/2018   AST 20 05/16/2018   BILITOT 0.4 05/16/2018    HIV 1 RNA Quant (copies/mL)  Date Value  08/01/2019 311,000 (H)  06/19/2019 167,000 (H)  04/18/2019 795,000 (H)   CD4 T Cell Abs (/uL)  Date Value  08/01/2019 <35 (L)  06/19/2019 50 (L)  04/18/2019 45 (L)    Assessment and Plan: Problem List Items Addressed This Visit      High   HIV disease (HCC) - Primary (Chronic)    Will reach out to [name] for more community support - correct phone number in system.  I asked him to please continue working with his mother for help taking his medications everyday. He conveys that she is very worried about his health and  progression. I have spoken to her several times over the phone in the past to provide her an update. I am hopeful that this time he will have an improvement in his viral load. Will continue symtuza once daily with food.  RTC in 2 months for adherence check in.       Relevant Orders   HIV 1 RNA quant-no reflex-bld   AIDS (acquired immune deficiency syndrome) (HCC) (Chronic)    No findings concerning for OI on exam today. Continue bactrim once a day with symtuza. While he should be taking azithromycin given his persistently low CD4 I am worried this will complicate things for him -  will continue to focus on ARV and PJP/Toxo proph for now.         Unprioritized   Eczema    Will refill hydrocortisone 2.5%. If this becomes more wide spread can do medicated lotion for more coverage. Encouraged cool/room temp showers and immediate application of lotion - he claims lotions burn him so I suggested using coconut oil or Vaseline to help.        Other Visit Diagnoses    Rash or skin eruption accompanying infectious disease       Relevant Medications   hydrocortisone 2.5 % cream      Rexene Alberts, MSN, NP-C [facility] for Infectious Disease [facility]  [email or web address] Pager: [phone] Office: [phone] [facility] Main Line: [phone]  08/31/2019

## 2019-08-31 NOTE — Assessment & Plan Note (Signed)
Will refill hydrocortisone 2.5%. If this becomes more wide spread can do medicated lotion for more coverage. Encouraged cool/room temp showers and immediate application of lotion - he claims lotions burn him so I suggested using coconut oil or Vaseline to help.

## 2019-08-31 NOTE — Assessment & Plan Note (Signed)
Will reach out to [name] for more community support - correct phone number in system.  I asked him to please continue working with his mother for help taking his medications everyday. He conveys that she is very worried about his health and progression. I have spoken to her several times over the phone in the past to provide her an update. I am hopeful that this time he will have an improvement in his viral load. Will continue symtuza once daily with food.  RTC in 2 months for adherence check in.

## 2019-09-03 ENCOUNTER — Encounter: Payer: Self-pay | Admitting: Pediatrics

## 2019-09-03 DIAGNOSIS — Q9381 Velo-cardio-facial syndrome: Secondary | ICD-10-CM | POA: Insufficient documentation

## 2019-09-04 ENCOUNTER — Telehealth: Payer: Self-pay | Admitting: *Deleted

## 2019-09-04 NOTE — Telephone Encounter (Signed)
-----   Message from Forsyth Callas, NP sent at 08/31/2019  3:24 PM EST ----- Daron Offer,   Can you reach out to [facility] for support?

## 2019-09-04 NOTE — Telephone Encounter (Signed)
Call placed to [facility] today. Ist attempt to engage was sent via text and Aaron Lee gave me a call. We dicussed how much [place] and I care about him.  I would love to start making home visits(pillbox refills) or just an extra support for him over the phone. He declined and stated "I am fine" I followed up by stating we really want to see some great numbers (viral load) at his next appt. Aaron Lee chuckled and asked me if I would be there for his appt in February. We verified his appt date and time, I told him if he wants me to meet him for his appt I will be there. We discussed the ways that I can help him by organizing his medications and listening to help come up with a plan to reach his goals. He kindly declined again and so I asked him to please keep my number and call if he can think of ANYTHING that I can help with. Aaron Lee agreed.

## 2019-09-04 NOTE — Telephone Encounter (Signed)
This is directly conflicting with every time we meet. He always asks about "where ms [name] is". Going to address with him at follow up. Again.   Thanks Delories Heinz

## 2019-09-05 NOTE — Telephone Encounter (Signed)
I think its best if I am just present for his next appt. I'm putting him on my schedule

## 2019-09-05 NOTE — Telephone Encounter (Signed)
That is a fabulous and brilliant idea. Looking forward to seeing you.

## 2019-09-09 LAB — HIV-1 RNA QUANT-NO REFLEX-BLD
HIV 1 RNA Quant: 351000 copies/mL — ABNORMAL HIGH
HIV-1 RNA Quant, Log: 5.55 Log copies/mL — ABNORMAL HIGH

## 2019-09-11 NOTE — Progress Notes (Signed)
Happy to help. I will be out the rest of the week though so I can catch back up with him once I return.

## 2019-09-12 MED FILL — SYMTUZA 800-150-200-10 MG T: 800-150-200 | 30 days supply | Qty: 30 | Fill #1

## 2019-09-12 MED FILL — SULFAMETHOXAZOLE-TMP DS TAB: 800-160 | 30 days supply | Qty: 30 | Fill #5

## 2019-10-09 ENCOUNTER — Other Ambulatory Visit: Payer: Self-pay | Admitting: Infectious Diseases

## 2019-10-09 DIAGNOSIS — B20 Human immunodeficiency virus [HIV] disease: Secondary | ICD-10-CM

## 2019-10-09 MED FILL — SULFAMETHOXAZOLE-TMP DS TAB: 800-160 | 30 days supply | Qty: 30 | Fill #0

## 2019-10-10 MED FILL — SYMTUZA 800-150-200-10 MG T: 800-150-200 | 30 days supply | Qty: 30 | Fill #2

## 2019-11-01 ENCOUNTER — Encounter: Payer: Self-pay | Admitting: Infectious Diseases

## 2019-11-01 ENCOUNTER — Ambulatory Visit: Payer: Medicaid Other | Admitting: *Deleted

## 2019-11-01 ENCOUNTER — Other Ambulatory Visit: Payer: Self-pay

## 2019-11-01 ENCOUNTER — Ambulatory Visit (INDEPENDENT_AMBULATORY_CARE_PROVIDER_SITE_OTHER): Payer: Medicaid Other | Admitting: Infectious Diseases

## 2019-11-01 VITALS — BP 119/72 | HR 95 | Temp 98.1°F | Ht 63.0 in | Wt 105.0 lb

## 2019-11-01 DIAGNOSIS — B20 Human immunodeficiency virus [HIV] disease: Secondary | ICD-10-CM | POA: Diagnosis not present

## 2019-11-01 NOTE — Patient Instructions (Signed)
Nice to see you .   Please continue taking your Symtuza (yellow pill), Bactrim (white pill) every day.   Will have you come back when we get the new medicine ready for you. We will need to start with pills once a day for 28 days then can start the shots to treat your HIV.

## 2019-11-01 NOTE — Assessment & Plan Note (Signed)
Ongoing challenges with adherence. He would be a great candidate for Cabenuva injections monthly. Will check genotype today to ensure no mutations that would preclude use.  Will have him fill out patient consent today. He is in agreement. I also called his mother (legal guardian) whom is very excited to offer this for him.  [name] joined [place] with our meeting also - will plan to call him during 28d lead in period to ensure adherence to make certain he tolerates the oral equivalent.  Follow up once we get medication available.

## 2019-11-01 NOTE — Progress Notes (Signed)
Name: Aaron Lee  DOB: Jan 29, 1991  MRN: [id number]  PCP: Jeanmarie Hubert, MD    Patient Active Problem List   Diagnosis Date Noted  . HIV disease ([place]) 02/23/2019    Priority: High  . AIDS (acquired immune deficiency syndrome) ([place]) 07/26/2017    Priority: High  . 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 Noncompliance with medication regimen 02/23/2019  . History of shingles 09/26/2018  . Cognitive developmental delay 10/01/2017  . History of syphilis 08/26/2017  . Anemia 08/02/2017  . Healthcare maintenance 08/02/2017  . Eczema 03/31/2017  . Seasonal allergic rhinitis 03/31/2017     SUBJECTIVE: Aaron Lee is a 29 y.o. AA male with HIV diagnosed in November 2018 with CD4 nadir < 10, VL 192,000 copies.  HIV Risk: bisexual.  OI Hx: shingles  Previous Regimens:   Biktarvy --> suppressed (changed to PI for adherence concern)  Symtuza 2020  Genotype:   07/2017 - K103S (R-nevirapine)   CC:  Routine follow up on HIV and adherence counseling.    HPI/ROS:  He is doing well and overcoming flare of eczema rash on arms bilaterally with prescribed steroid and Vaseline.  Still only taking Symtuza every 3 days a week maybe.  No changes otherwise.    Review of Systems  Constitutional: Negative for appetite change, chills, fatigue, fever and unexpected weight change.  Eyes: Negative for visual disturbance.  Respiratory: Negative for cough and shortness of breath.   Cardiovascular: Negative for chest pain and leg swelling.  Gastrointestinal: Negative for abdominal pain, diarrhea and nausea.  Genitourinary: Negative for discharge, dysuria and genital sores.  Musculoskeletal: Negative for joint swelling.  Skin: Positive for rash. Negative for color change.  Neurological: Negative for dizziness and headaches.  Hematological: Negative for adenopathy.  Psychiatric/Behavioral: Negative for sleep disturbance. The patient is not nervous/anxious.      Past  Medical History:  Diagnosis Date  . Eczema   . HIV (human immunodeficiency virus infection) ([place])   . Shingles outbreak 09/26/2018    Social History   Tobacco Use  . Smoking status: Light Tobacco Smoker    Packs/day: 0.10    Types: Cigarettes  . Smokeless tobacco: Never Used  Substance Use Topics  . Alcohol use: No    Comment: occ  . Drug use: Yes    Frequency: 7.0 times per week    Types: Marijuana   He  reports being sexually active and has had partner(s) who are Male and Male. He reports using the following method of birth control/protection: Condom.  No Known Allergies   Outpatient Medications Prior to Visit  Medication Sig Dispense Refill  . sulfamethoxazole-trimethoprim (BACTRIM DS) 800-160 MG tablet TAKE 1 TABLET BY MOUTH DAILY. 30 tablet 5  . SYMTUZA 800-150-200-10 MG TABS TAKE 1 TABLET BY MOUTH DAILY WITH BREAKFAST. 30 tablet 5  . hydrocortisone 2.5 % cream Apply topically 2 (two) times daily. To anterior chest (Patient not taking: Reported on 11/01/2019) 30 g 3  . valACYclovir (VALTREX) 1000 MG tablet Take 1 tablet (1,000 mg total) by mouth daily. (Patient not taking: Reported on 11/01/2019) 30 tablet 3   No facility-administered medications prior to visit.           Objective: Vitals:   11/01/19 1436  BP: 119/72  Pulse: 95  Temp: 98.1 F (36.7 C)  SpO2: 100%    Physical Exam Vitals and nursing note reviewed.  Constitutional:      Appearance: He is well-developed.     Comments:  Thin appearing, well developed.   HENT:     Mouth/Throat:     Mouth: Mucous membranes are moist.     Dentition: Normal dentition. No dental abscesses.     Pharynx: Oropharynx is clear.  Eyes:     General: No scleral icterus.    Pupils: Pupils are equal, round, and reactive to light.  Cardiovascular:     Rate and Rhythm: Normal rate and regular rhythm.     Heart sounds: Normal heart sounds.  Pulmonary:     Effort: Pulmonary effort is normal.     Breath sounds: Normal breath  sounds.  Abdominal:     General: There is no distension.     Palpations: Abdomen is soft.     Tenderness: There is no abdominal tenderness.  Lymphadenopathy:     Cervical: No cervical adenopathy.  Skin:    General: Skin is warm and dry.     Findings: No rash.  Neurological:     Mental Status: He is alert and oriented to person, place, and time.  Psychiatric:        Judgment: Judgment normal.     Lab Results Lab Results  Component Value Date   WBC 2.7 (L) 07/14/2018   HGB 13.1 (L) 07/14/2018   HCT 40.0 07/14/2018   MCV 86.8 07/14/2018   PLT 79 (L) 07/14/2018    Lab Results  Component Value Date   CREATININE 1.18 05/16/2018   BUN 16 05/16/2018   NA 143 05/16/2018   K 3.9 05/16/2018   CL 107 05/16/2018   CO2 29 05/16/2018    Lab Results  Component Value Date   ALT 12 05/16/2018   AST 20 05/16/2018   BILITOT 0.4 05/16/2018    HIV 1 RNA Quant (copies/mL)  Date Value  08/31/2019 351,000 (H)  08/01/2019 311,000 (H)  06/19/2019 167,000 (H)   CD4 T Cell Abs (/uL)  Date Value  08/01/2019 <35 (L)  06/19/2019 50 (L)  04/18/2019 45 (L)    Assessment and Plan: Problem List Items Addressed This Visit      High   HIV disease (HCC) (Chronic)    Ongoing challenges with adherence. He would be a great candidate for Cabenuva injections monthly. Will check genotype today to ensure no mutations that would preclude use.  Will have him fill out patient consent today. He is in agreement. I also called his mother (legal guardian) whom is very excited to offer this for him.  [name] joined [place] with our meeting also - will plan to call him during 28d lead in period to ensure adherence to make certain he tolerates the oral equivalent.  Follow up once we get medication available.       AIDS (acquired immune deficiency syndrome) (HCC) - Primary (Chronic)   Relevant Orders   HIV RNA, RTPCR W/R GT (RTI, PI,INT)      Rexene Alberts, MSN, NP-C [facility]  [email or web address] Pager: [phone] Office: [phone] [facility] Main Line: [phone]  11/01/2019

## 2019-11-02 ENCOUNTER — Telehealth: Payer: Self-pay | Admitting: Pharmacist

## 2019-11-02 ENCOUNTER — Telehealth: Payer: Self-pay | Admitting: *Deleted

## 2019-11-02 NOTE — Telephone Encounter (Signed)
Medication reminder sent to [facility] today with a reply received. 

## 2019-11-02 NOTE — Telephone Encounter (Signed)
Completed and sent in application for Cabenuva. Will let Judeth Cornfield know when approval status has been determined.

## 2019-11-03 ENCOUNTER — Other Ambulatory Visit: Payer: Self-pay

## 2019-11-03 ENCOUNTER — Telehealth: Payer: Self-pay | Admitting: *Deleted

## 2019-11-03 NOTE — Progress Notes (Signed)
Meet the patient during his [facility] appt. Offer made to assist the patient with medication adherence. Patient stated he would like to have Mon-Fri medication reminder calls to help him with medication adherence. Colletta [place] and I agreed to call him Mon-Fri via text with medication reminders. During today's visit consent was signed.  RN met with client for assessment. RN reviewed Transport planner, [place], Home Safety Management Information Booklet. Home Fire Safety Assessment, Fall Risk Assessment and Suicide Risk Assessment was performed. RN also discussed information on a Living Will, Advanced Directives, and [place]. RN and Client/Designated Party educated/reviewed/signed Client Agreement and Consent for Service form along with Patient Rights and Responsibilities statement. RN developed patient specific and centered care plan. RN provided contact information and reviewed how to receive emergency help after hours for schedule changes, billing  questions, reporting of safety issues, falls, concerns or any needs/questions. Standard Precaution and Infection control along with interventions to correct or prevent high risk behaviors instructed to the patient. Client/Caregiver reports understanding and agreement with the above

## 2019-11-03 NOTE — Telephone Encounter (Signed)
Initial order received on 11/01/19 by Rexene Alberts NP to evaluate patient for [facility]).  1st attempt made on 11/01/19 with evaluation completed on 11/01/19 for [facility]. Patient was consented to care at this time.   Frequency / Duration of [facility] visits: Effective 11/01/19 3w1, 5w3, 14mo2 4 PRN's for complications with disease process/progression, medication changes or concerns   [facility] will assess for learning needs related to diagnosis and treatment regimen, provide education as needed, fill pill box if needed, address any barriers which may be preventing medication compliance, and communicating with care team including physician and case manager.   Individualized Plan Of Care with Certification Period effective 11/01/19 to 01/30/20  a. Type of service(s) and care to be delivered: RN Case Management  b. Frequency and duration of service: Effective 11/01/19 3w1,5w3,  62mo2, 4 prns for complications with disease process/progression, medication changes or  concerns . Visits/Contact may be conducted telephonically or in person to best suit the patient. Patient would like mon-fri texted messages for medication reminder. Home visits will be offered. Home is safe for visits  c. Activity restrictions: Pt may be up as tolerated and can safely ambulate without the need for a assistive device   d. Safety Measures: Standard Precautions/Infection Control   e. Service Objectives and Goals: Service Objectives are to assist the pt with HIV medication regimen adherence and staying in care with the Infectious Disease Clinic by identifying barriers to care. RN will address the barriers that are identified by the patient. Patient mother is his legal guardian and states she just wants to be sure he is taking his medications. Viremia noted on his last lab draw but he states he is adherent to his medications.   f. Equipment required: No additional equipment needs at this time   g.  Functional Limitations: None noted  h. Rehabilitation potential: Guarded   i. Diet and Nutritional Needs: Regular Diet   j. Medications and treatments: Medications have been reconciled and reviewed and are a part of EPIC electronic file   k. Specific therapies if needed: RN   l. Pertinent diagnoses: HIV disease,  Hx of medication NonCompliance, Patient has a legal guardian   m. Expected outcome: Guarded

## 2019-11-03 NOTE — Telephone Encounter (Signed)
Thank you :)

## 2019-11-03 NOTE — Telephone Encounter (Signed)
Medication reminder sent to [facility] today with a reply received.

## 2019-11-06 ENCOUNTER — Telehealth: Payer: Self-pay | Admitting: *Deleted

## 2019-11-06 NOTE — Telephone Encounter (Signed)
Medication reminder sent to [facility] today. 

## 2019-11-06 NOTE — Progress Notes (Signed)
Yes, I worry too. Patients were undetectable who started it so we really do not know. I reached out to the [facility] and asked if he knew if any patients had detectable HIV levels in any of the studies. Waiting on his response. Just let me know what you'd like to do.

## 2019-11-07 ENCOUNTER — Telehealth: Payer: Self-pay | Admitting: *Deleted

## 2019-11-07 MED FILL — SULFAMETHOXAZOLE-TMP DS TAB: 800-160 | 30 days supply | Qty: 30 | Fill #1

## 2019-11-07 MED FILL — SYMTUZA 800-150-200-10 MG T: 800-150-200 | 30 days supply | Qty: 30 | Fill #3

## 2019-11-07 NOTE — Telephone Encounter (Signed)
Medication reminder sent to [facility] today.

## 2019-11-08 NOTE — Telephone Encounter (Signed)
I agree with the planned outlined by [name].   Unfortuantely his viral load is too high to safely start injectable regimen at this time. Will work with Caryn Bee and hopefully his family to help with adherence to lower viral load enough to safely consider. Appreciate any and all help from [name] and our pharmacy team.

## 2019-11-17 LAB — HIV-1 INTEGRASE GENOTYPE

## 2019-11-17 LAB — HIV-1 GENOTYPE: HIV-1 Genotype: DETECTED — AB

## 2019-11-17 LAB — HIV RNA, RTPCR W/R GT (RTI, PI,INT)
HIV 1 RNA Quant: 716000 copies/mL — ABNORMAL HIGH
HIV-1 RNA Quant, Log: 5.85 Log copies/mL — ABNORMAL HIGH

## 2019-12-04 MED FILL — SULFAMETHOXAZOLE-TMP DS TAB: 800-160 | 30 days supply | Qty: 30 | Fill #2

## 2019-12-04 MED FILL — SYMTUZA 800-150-200-10 MG T: 800-150-200 | 30 days supply | Qty: 30 | Fill #4

## 2019-12-29 MED FILL — SYMTUZA 800-150-200-10 MG T: 800-150-200 | 30 days supply | Qty: 30 | Fill #5

## 2019-12-29 MED FILL — SULFAMETHOXAZOLE-TMP DS TAB: 800-160 | 30 days supply | Qty: 30 | Fill #3

## 2020-01-03 ENCOUNTER — Telehealth: Payer: Self-pay

## 2020-01-03 NOTE — Telephone Encounter (Signed)
COVID-19 Pre-Screening Questions:01/03/20   Do you currently have a fever (>100 F), chills or unexplained body aches?NO  Are you currently experiencing new cough, shortness of breath, sore throat, runny nose? NO .  Have you recently travelled outside the state of [place] in the last 14 days? NO  .  Have you been in contact with someone that is currently pending confirmation of Covid19 testing or has been confirmed to have the Covid19 virus?  NO  **If the patient answers NO to ALL questions -  advise the patient to please call the clinic before coming to the office should any symptoms develop.     

## 2020-01-04 ENCOUNTER — Telehealth: Payer: Self-pay

## 2020-01-04 ENCOUNTER — Encounter: Payer: Self-pay | Admitting: Infectious Diseases

## 2020-01-04 ENCOUNTER — Other Ambulatory Visit: Payer: Self-pay | Admitting: Infectious Diseases

## 2020-01-04 ENCOUNTER — Ambulatory Visit (INDEPENDENT_AMBULATORY_CARE_PROVIDER_SITE_OTHER): Payer: Medicaid Other | Admitting: Infectious Diseases

## 2020-01-04 ENCOUNTER — Other Ambulatory Visit: Payer: Self-pay

## 2020-01-04 VITALS — BP 123/69 | HR 97 | Temp 97.6°F | Ht 63.0 in | Wt 107.0 lb

## 2020-01-04 DIAGNOSIS — L309 Dermatitis, unspecified: Secondary | ICD-10-CM | POA: Diagnosis not present

## 2020-01-04 DIAGNOSIS — Z9114 Patient's other noncompliance with medication regimen: Secondary | ICD-10-CM

## 2020-01-04 DIAGNOSIS — L659 Nonscarring hair loss, unspecified: Secondary | ICD-10-CM | POA: Insufficient documentation

## 2020-01-04 DIAGNOSIS — B20 Human immunodeficiency virus [HIV] disease: Secondary | ICD-10-CM

## 2020-01-04 DIAGNOSIS — Z8619 Personal history of other infectious and parasitic diseases: Secondary | ICD-10-CM | POA: Diagnosis not present

## 2020-01-04 DIAGNOSIS — Z0000 Encounter for general adult medical examination without abnormal findings: Secondary | ICD-10-CM

## 2020-01-04 MED ORDER — PREDNISONE 20 MG PO TABS: 40.0000 mg | ORAL_TABLET | Freq: Every day | ORAL | 0 refills | Status: AC

## 2020-01-04 MED ORDER — HYDRALAZINE HCL 25 MG PO TABS: 25.0000 mg | ORAL_TABLET | Freq: Three times a day (TID) | ORAL | 0 refills | Status: DC

## 2020-01-04 MED ORDER — PENICILLIN G BENZATHINE 1200000 UNIT/2ML IM SUSP
1.2000 10*6.[IU] | Freq: Once | INTRAMUSCULAR | Status: AC
  Administered 2020-01-04: 1.2 10*6.[IU] via INTRAMUSCULAR

## 2020-01-04 MED ORDER — HYDROCORTISONE 1 % EX LOTN: 1.0000 "application " | TOPICAL_LOTION | Freq: Two times a day (BID) | CUTANEOUS | 0 refills | Status: DC

## 2020-01-04 MED ORDER — HYDROCORTISONE 2.5 % EX CREA: TOPICAL_CREAM | Freq: Two times a day (BID) | CUTANEOUS | 3 refills | Status: DC

## 2020-01-04 MED ORDER — BIKTARVY 50-200-25 MG PO TABS: 1.0000 | ORAL_TABLET | Freq: Every day | ORAL | 11 refills | Status: DC

## 2020-01-04 MED FILL — HYDROCORTISONE 2.5% CREAM: 2.5 | 15 days supply | Qty: 30 | Fill #0

## 2020-01-04 MED FILL — predniSONE 20 MG TABS: 20 | 5 days supply | Qty: 10 | Fill #0

## 2020-01-04 MED FILL — hydrALAZINE HCL 25 MG TABS: 25 | 20 days supply | Qty: 60 | Fill #0

## 2020-01-04 MED FILL — HYDROCORTISONE 1 % LOTN: 1 | 30 days supply | Qty: 118 | Fill #0

## 2020-01-04 NOTE — Telephone Encounter (Signed)
Patient's father called triage requesting to speak with Judeth Cornfield, NP. Wouldn't tell RN what he needed just requested she call him back. [phone]  Rosanna Randy, RN

## 2020-01-04 NOTE — Assessment & Plan Note (Signed)
He has a bad flare up today. Explained to both him and his father that this will continue to be poorly controlled with AIDS and uncontrolled HIV viremia. Will do short course prednisone x 5d, PRN hydralazine for itching, hydrocortisone lotion to cover larger surface areas and cream for the smaller spots that are particularly bothersome.  Discussed other measures to help prevent worsening.  Referral to dermatology for consideration of any alternative maintenance recommendations that may be helpful here.

## 2020-01-04 NOTE — Assessment & Plan Note (Signed)
Discussed he is eligible to receive COVID vaccine anytime however he may not have a full therapeutic protective benefit given his poor Tcell count. Advised to Aaron Lee and his father that he should continue preventative measures to avoid contracting with masking, washing hands/surfaces, limiting close contact with people outside the household, etc.

## 2020-01-04 NOTE — Telephone Encounter (Signed)
Returned call

## 2020-01-04 NOTE — Assessment & Plan Note (Signed)
Concerned that this is due to either newly acquired syphilis vs chronically low CD4 count. Will check RPR today and treat with 2.4 million units Bicillin x 1 today for presumed syphilis. Explained that he can still contract syphilis through performing unprotected oral sex as well.

## 2020-01-04 NOTE — Progress Notes (Signed)
Name: Aaron Lee  DOB: August 23, 1991  MRN: [id number]  PCP: No primary care provider on file.    Patient Active Problem List   Diagnosis Date Noted  . HIV disease (HCC) 02/23/2019    Priority: High  . AIDS (acquired immune deficiency syndrome) (HCC) 07/26/2017    Priority: High  . Alopecia 01/04/2020  . 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 Noncompliance with medication regimen 02/23/2019  . History of shingles 09/26/2018  . Cognitive developmental delay 10/01/2017  . History of syphilis 08/26/2017  . Anemia 08/02/2017  . Healthcare maintenance 08/02/2017  . Eczema 03/31/2017  . Seasonal allergic rhinitis 03/31/2017     SUBJECTIVE: Aaron Lee is a 29 y.o. AA male with HIV diagnosed in November 2018 with CD4 nadir < 10, VL 192,000 copies.  HIV Risk: bisexual.  OI Hx: shingles  Previous Regimens:   Biktarvy --> suppressed (changed to PI for adherence concern)  Symtuza 2020  Genotype:   07/2017 - K103S (R-nevirapine)   CC:  Routine follow up on HIV and adherence counseling. Having worse rashes and losing body hair.     HPI: He has severe eczema flare today and has rashes all over body up through neck. He is itching constantly. His father joins [place] on the phone during the visit today and verifies this and asking for help with what we can do to help him.   He is also experiencing new diffuse hair loss involving the head, eyelashes and eye brows. He is uncertain as to when this started. He denies any sexual partners recently. No genital or oral lesions.   He still is not taking his Symtuza and told his father he didn't need to take that at all because he was going to start shots. In further discussion he states that he hates his medication and is angry he has to take this everyday. He does feel like he has been having some depression with regards to this and hard to find motivation. He still has phone reminders go off everyday at 9:00 am to remind him to  take his HIV pills. He thinks that the monthly shots would really help him.  He is not having any complaints today suggestive of associated opportunistic infection or advancing HIV disease such as fevers, night sweats, weight loss, anorexia, cough, SOB, nausea, vomiting, diarrhea, headache, sensory changes, lymphadenopathy or oral thrush.    Asking about COVID vaccine and safety for him.    Review of Systems  Constitutional: Negative for appetite change, chills, fatigue, fever and unexpected weight change.  Eyes: Negative for visual disturbance.  Respiratory: Negative for cough and shortness of breath.   Cardiovascular: Negative for chest pain and leg swelling.  Gastrointestinal: Negative for abdominal pain, diarrhea and nausea.  Genitourinary: Negative for discharge, dysuria and genital sores.  Musculoskeletal: Negative for joint swelling.  Skin: Positive for rash. Negative for color change.  Neurological: Negative for dizziness and headaches.  Hematological: Negative for adenopathy.  Psychiatric/Behavioral: Negative for sleep disturbance. The patient is not nervous/anxious.      Past Medical History:  Diagnosis Date  . Eczema   . HIV (human immunodeficiency virus infection) (HCC)   . Shingles outbreak 09/26/2018    Social History   Tobacco Use  . Smoking status: Light Tobacco Smoker    Packs/day: 0.10    Types: Cigarettes  . Smokeless tobacco: Never Used  Substance Use Topics  . Alcohol use: No    Comment: occ  . Drug use:  Yes    Frequency: 7.0 times per week    Types: Marijuana   He  reports being sexually active and has had partner(s) who are Male and Male. He reports using the following method of birth control/protection: Condom.  No Known Allergies   Outpatient Medications Prior to Visit  Medication Sig Dispense Refill  . valACYclovir (VALTREX) 1000 MG tablet Take 1 tablet (1,000 mg total) by mouth daily. 30 tablet 3  . SYMTUZA 800-150-200-10 MG TABS TAKE 1  TABLET BY MOUTH DAILY WITH BREAKFAST. 30 tablet 5  . sulfamethoxazole-trimethoprim (BACTRIM DS) 800-160 MG tablet TAKE 1 TABLET BY MOUTH DAILY. (Patient not taking: Reported on 01/04/2020) 30 tablet 5  . hydrocortisone 2.5 % cream Apply topically 2 (two) times daily. To anterior chest (Patient not taking: Reported on 11/01/2019) 30 g 3   No facility-administered medications prior to visit.           Objective: Vitals:   01/04/20 1117  BP: 123/69  Pulse: 97  Temp: 97.6 F (36.4 C)  SpO2: 100%    Physical Exam Vitals and nursing note reviewed.  Constitutional:      Appearance: He is well-developed.     Comments: Thin appearing, well developed. Constantly scratching.   HENT:     Head:     Comments: Diffuse loss of hair overlying scalp. There is no pattern or patches, no rashes or trauma to hair. Generalized thinning noted.  He has lost nearly all of his eyebrows and eye lashes.     Mouth/Throat:     Mouth: Mucous membranes are moist. No oral lesions.     Dentition: Normal dentition. No dental abscesses.     Tongue: No lesions.     Pharynx: Oropharynx is clear.  Eyes:     General: No scleral icterus.    Pupils: Pupils are equal, round, and reactive to light.  Cardiovascular:     Rate and Rhythm: Normal rate and regular rhythm.     Heart sounds: Normal heart sounds.  Pulmonary:     Effort: Pulmonary effort is normal.     Breath sounds: Normal breath sounds.  Abdominal:     General: There is no distension.     Palpations: Abdomen is soft.     Tenderness: There is no abdominal tenderness.  Lymphadenopathy:     Cervical: No cervical adenopathy.  Skin:    General: Skin is warm and dry.     Capillary Refill: Capillary refill takes less than 2 seconds.     Findings: No rash.     Comments: Various wide spread patches of eczema noted along antecubital fossa b/l extending down forearm, side of neck, back, chest torso and glutes in various stages of healing. Skin is not overtly  inflamed despite multiple abrasions from scratching and there is no signs of secondary bacterial infections today.   Neurological:     Mental Status: He is alert and oriented to person, place, and time.  Psychiatric:        Judgment: Judgment normal.     Lab Results Lab Results  Component Value Date   WBC 2.7 (L) 07/14/2018   HGB 13.1 (L) 07/14/2018   HCT 40.0 07/14/2018   MCV 86.8 07/14/2018   PLT 79 (L) 07/14/2018    Lab Results  Component Value Date   CREATININE 1.18 05/16/2018   BUN 16 05/16/2018   NA 143 05/16/2018   K 3.9 05/16/2018   CL 107 05/16/2018   CO2 29 05/16/2018  Lab Results  Component Value Date   ALT 12 05/16/2018   AST 20 05/16/2018   BILITOT 0.4 05/16/2018    HIV 1 RNA Quant (copies/mL)  Date Value  11/01/2019 716,000 (H)  08/31/2019 351,000 (H)  08/01/2019 311,000 (H)   CD4 T Cell Abs (/uL)  Date Value  08/01/2019 <35 (L)  06/19/2019 50 (L)  04/18/2019 45 (L)    Assessment and Plan: Problem List Items Addressed This Visit      High   HIV disease ([place]) (Chronic)    We spent a long time discussing and recounting the last 2.5 years of working together. He has made zero progress with his health and has continued to suffer frequent flares of chronic skin problems, history of shingles outbreak amongst other infections of the nailbeds. He fortunately has not suffered more severe side effects to date.   I spoke wit his father on the phone today to convey urgency to help hold Terrian accountable for medication adherence given life saving nature of this treatment. It sounds like Taryn is causing this to be a difficult task. Darby did express to me today anger over the daily burden of taking medications and some trouble with depression. Will have him see Marcie Bal in the next 1-2 weeks to help explore and work through this in hopes he will be better adherent with meds.   I filled 4 pill trays today and observed him taking Biktarvy today. Will plan to  switch him back to [facility] for more rapid reduction of VL in hopes to transition him to [place] injections if we can get his viral load down to a more reasonable level so we can safely begin. Ideally < 1000 copies would be great. He will continue 9:00 am pill alarm.   RTC with me in 4 weeks - he is to bring his new medication bottle and pill trays so I may refill for him       Relevant Medications   bictegravir-emtricitabine-tenofovir AF (BIKTARVY) 50-200-25 MG TABS tablet   AIDS (acquired immune deficiency syndrome) ([place]) - Primary (Chronic)    His alopecia may alternatively be related to chronically low CD4 counts. Otherwise no findings concerning for OI today on exam.  While I would hope he continues taking his Bactrim for PJP/Toxo prophylaxis at this time I just want him to get the [facility] in his system consistently to drop viral load.       Relevant Medications   bictegravir-emtricitabine-tenofovir AF (BIKTARVY) 50-200-25 MG TABS tablet     Unprioritized   Noncompliance with medication regimen    Counseling efforts as above. He refuses to work with [employer] consistently. Refuse to talk with [facility] today but did agree to counseling. Provided resources for [employer].  Will also engage Audelia Hives to assist.       History of syphilis    RPR today       Relevant Orders   RPR   Healthcare maintenance    Discussed he is eligible to receive COVID vaccine anytime however he may not have a full therapeutic protective benefit given his poor Tcell count. Advised to Amiere and his father that he should continue preventative measures to avoid contracting with masking, washing hands/surfaces, limiting close contact with people outside the household, etc.       Eczema    He has a bad flare up today. Explained to both him and his father that this will continue to be poorly controlled with AIDS and uncontrolled HIV viremia. Will  do short course prednisone x 5d, PRN hydralazine for itching,  hydrocortisone lotion to cover larger surface areas and cream for the smaller spots that are particularly bothersome.  Discussed other measures to help prevent worsening.  Referral to dermatology for consideration of any alternative maintenance recommendations that may be helpful here.       Relevant Medications   predniSONE (DELTASONE) 20 MG tablet   hydrocortisone 1 % lotion   hydrALAZINE (APRESOLINE) 25 MG tablet   hydrocortisone 2.5 % cream   Other Relevant Orders   Ambulatory referral to Dermatology   Alopecia    Concerned that this is due to either newly acquired syphilis vs chronically low CD4 count. Will check RPR today and treat with 2.4 million units Bicillin x 1 today for presumed syphilis. Explained that he can still contract syphilis through performing unprotected oral sex as well.       Relevant Orders   RPR      RTC in 1 month   Rexene Alberts, MSN, NP-C [facility] Infectious Disease [facility]  [email or web address] Pager: [phone] Office: [phone] [facility] Main Line: [phone]  01/04/2020

## 2020-01-04 NOTE — Assessment & Plan Note (Addendum)
Counseling efforts as above. He refuses to work with Molli Posey consistently. Refuse to talk with [facility] today but did agree to counseling. Provided resources for [employer].  Will also engage Kelby Fam to assist.

## 2020-01-04 NOTE — Assessment & Plan Note (Signed)
--  RPR today

## 2020-01-04 NOTE — Assessment & Plan Note (Signed)
We spent a long time discussing and recounting the last 2.5 years of working together. He has made zero progress with his health and has continued to suffer frequent flares of chronic skin problems, history of shingles outbreak amongst other infections of the nailbeds. He fortunately has not suffered more severe side effects to date.   I spoke wit his father on the phone today to convey urgency to help hold Aaron Lee accountable for medication adherence given life saving nature of this treatment. It sounds like Aaron Lee is causing this to be a difficult task. Aaron Lee did express to me today anger over the daily burden of taking medications and some trouble with depression. Will have him see Marylu Lund in the next 1-2 weeks to help explore and work through this in hopes he will be better adherent with meds.   I filled 4 pill trays today and observed him taking Biktarvy today. Will plan to switch him back to [facility] for more rapid reduction of VL in hopes to transition him to [place] injections if we can get his viral load down to a more reasonable level so we can safely begin. Ideally < 1000 copies would be great. He will continue 9:00 am pill alarm.   RTC with me in 4 weeks - he is to bring his new medication bottle and pill trays so I may refill for him

## 2020-01-04 NOTE — Patient Instructions (Addendum)
I need you to take your Biktarvy every day - please try! You have 1 month worth of pills ready for you to take every day in your trays. Know that we can try to get you on the shots monthly but you are not currently able with your viral load being so high.   For your skin - you will never completely stop having rashes until your immune system gets better.   I will give you 5 days of prednisone (take 4 tablets once a day with food in the morning).   For the itching I want you to use the topical steroid cream and a medication called Hydralazine - can take this three times a day. Will be sent to your pharmacy to be filled.   I sent in a lotion for the larger areas on the arms/chest/legs that itch and a steroid cream for you to use.   I worry your hair is falling out either from syphilis re-infection or because your immune system is so low. Will treat your for presumed syphilis today.   Please schedule an appointment to see me again in 1 month.   Please schedule an appointment to see Marcie Bal in 1 week for counseling.      Eczema Eczema is a broad term for a group of skin conditions that cause skin to become rough and inflamed. Each type of eczema has different triggers, symptoms, and treatments. Eczema of any type is usually itchy and symptoms range from mild to severe. Eczema and its symptoms are not spread from person to person (are not contagious). It can appear on different parts of the body at different times. Your eczema may not look the same as someone else's eczema. What are the types of eczema? Atopic dermatitis This is a long-term (chronic) skin disease that keeps coming back (recurring). Usual symptoms are dry skin and small, solid pimples that may swell and leak fluid (weep). Contact dermatitis  This happens when something irritates the skin and causes a rash. The irritation can come from substances that you are allergic to (allergens), such as poison ivy, chemicals, or medicines that  were applied to your skin. Dyshidrotic eczema This is a form of eczema on the hands and feet. It shows up as very itchy, fluid-filled blisters. It can affect people of any age, but is more common before age 61. Hand eczema  This causes very itchy areas of skin on the palms and sides of the hands and fingers. This type of eczema is common in industrial jobs where you may be exposed to many different types of irritants. Lichen simplex chronicus This type of eczema occurs when a person constantly scratches one area of the body. Repeated scratching of the area leads to thickened skin (lichenification). Lichen simplex chronicus can occur along with other types of eczema. It is more common in adults, but may be seen in children as well. Nummular eczema This is a common type of eczema. It has no known cause. It typically causes a red, circular, crusty lesion (plaque) that may be itchy. Scratching may become a habit and can cause bleeding. Nummular eczema occurs most often in people of middle-age or older. It most often affects the hands. Seborrheic dermatitis This is a common skin disease that mainly affects the scalp. It may also affect any oily areas of the body, such as the face, sides of nose, eyebrows, ears, eyelids, and chest. It is marked by small scaling and redness of the skin (erythema). This  can affect people of all ages. In infants, this condition is known as Location manager." Stasis dermatitis This is a common skin disease that usually appears on the legs and feet. It most often occurs in people who have a condition that prevents blood from being pumped through the veins in the legs (chronic venous insufficiency). Stasis dermatitis is a chronic condition that needs long-term management. How is eczema diagnosed? Your health care provider will examine your skin and review your medical history. He or she may also give you skin patch tests. These tests involve taking patches that contain possible  allergens and placing them on your back. He or she will then check in a few days to see if an allergic reaction occurred. What are the common treatments? Treatment for eczema is based on the type of eczema you have. Hydrocortisone steroid medicine can relieve itching quickly and help reduce inflammation. This medicine may be prescribed or obtained over-the-counter, depending on the strength of the medicine that is needed. Follow these instructions at home:  Take over-the-counter and prescription medicines only as told by your health care provider.  Use creams or ointments to moisturize your skin. Do not use lotions.  Learn what triggers or irritates your symptoms. Avoid these things.  Treat symptom flare-ups quickly.  Do not itch your skin. This can make your rash worse.  Keep all follow-up visits as told by your health care provider. This is important. Where to find more information  The American Academy of Dermatology: InfoExam.si  The National Eczema Association: www.nationaleczema.org Contact a health care provider if:  You have serious itching, even with treatment.  You regularly scratch your skin until it bleeds.  Your rash looks different than usual.  Your skin is painful, swollen, or more red than usual.  You have a fever. Summary  There are eight general types of eczema. Each type has different triggers.  Eczema of any type causes itching that may range from mild to severe.  Treatment varies based on the type of eczema you have. Hydrocortisone steroid medicine can help with itching and inflammation.  Protecting your skin is the best way to prevent eczema. Use moisturizers and lotions. Avoid triggers and irritants, and treat flare-ups quickly. This information is not intended to replace advice given to you by your health care provider. Make sure you discuss any questions you have with your health care provider. Document Revised: 08/20/2017 Document Reviewed:  01/21/2017 Elsevier Patient Education  2020 [employer].

## 2020-01-04 NOTE — Assessment & Plan Note (Signed)
His alopecia may alternatively be related to chronically low CD4 counts. Otherwise no findings concerning for OI today on exam.  While I would hope he continues taking his Bactrim for PJP/Toxo prophylaxis at this time I just want him to get the [facility] in his system consistently to drop viral load.

## 2020-01-05 LAB — FLUORESCENT TREPONEMAL AB(FTA)-IGG-BLD: Fluorescent Treponemal ABS: REACTIVE — AB

## 2020-01-05 LAB — RPR TITER: RPR Titer: 1:2 {titer} — ABNORMAL HIGH

## 2020-01-05 LAB — RPR: RPR Ser Ql: REACTIVE — AB

## 2020-01-29 ENCOUNTER — Other Ambulatory Visit: Payer: Self-pay | Admitting: Infectious Diseases

## 2020-01-29 DIAGNOSIS — B20 Human immunodeficiency virus [HIV] disease: Secondary | ICD-10-CM

## 2020-01-30 ENCOUNTER — Telehealth: Payer: Self-pay | Admitting: *Deleted

## 2020-01-30 NOTE — Telephone Encounter (Signed)
The intent of this communication is to inform the Health Care Team that this patient will be discharged from [facility]).  Greater than 3 attempts have been made to re-engage the patient  without any success .Moving forward, the [facility] will be willing to reopen the patient to services if and when the patient is ready to discuss medication adherence and HIV disease  management. Effective 01/31/20 patient will be discharged and removed from [facility] active patient listing.

## 2020-01-31 ENCOUNTER — Other Ambulatory Visit: Payer: Self-pay

## 2020-01-31 ENCOUNTER — Ambulatory Visit (INDEPENDENT_AMBULATORY_CARE_PROVIDER_SITE_OTHER): Payer: Medicaid Other | Admitting: Infectious Diseases

## 2020-01-31 ENCOUNTER — Encounter: Payer: Self-pay | Admitting: Infectious Diseases

## 2020-01-31 VITALS — BP 126/83 | HR 93 | Temp 98.1°F | Ht 63.0 in | Wt 112.0 lb

## 2020-01-31 DIAGNOSIS — L2082 Flexural eczema: Secondary | ICD-10-CM | POA: Diagnosis not present

## 2020-01-31 DIAGNOSIS — L659 Nonscarring hair loss, unspecified: Secondary | ICD-10-CM

## 2020-01-31 DIAGNOSIS — B20 Human immunodeficiency virus [HIV] disease: Secondary | ICD-10-CM | POA: Diagnosis not present

## 2020-01-31 NOTE — Assessment & Plan Note (Signed)
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

## 2020-01-31 NOTE — Assessment & Plan Note (Signed)
Ongoing - explained I cannot keep him on prednisone long term because of further suppression of immune system. He did not respond to dermatology - will try again now that we have a number for him.

## 2020-01-31 NOTE — Patient Instructions (Signed)
Please stop by the lab on your way out.   Please continue getting your Biktarvy pill every single day! If we can get your viral load down to something safe we can switch you to the injectable medication.

## 2020-01-31 NOTE — Assessment & Plan Note (Signed)
Explained that the changes with his hair including loss is related to AIDS. I am not sure he 100% understands.  Hopefully his adherence has improved. Will repeat VL today and have him return in 1 month for repeat assessment. If he can get suppressed or nearly so I would feel comfortable switching him to [facility] for treatment.  He will meet with Marylu Lund in 2 weeks as scheduled to explore anger/depression as it relates to medication adherence.

## 2020-01-31 NOTE — Progress Notes (Signed)
Name: Aaron Lee  DOB: 12/12/1990  MRN: [id number]  PCP: Patient, No Pcp Per    Patient Active Problem List   Diagnosis Date Noted  . HIV disease (HCC) 02/23/2019    Priority: High  . AIDS (acquired immune deficiency syndrome) (HCC) 07/26/2017    Priority: High  . Alopecia 01/04/2020  . 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 Noncompliance with medication regimen 02/23/2019  . History of shingles 09/26/2018  . Cognitive developmental delay 10/01/2017  . History of syphilis 08/26/2017  . Anemia 08/02/2017  . Healthcare maintenance 08/02/2017  . Eczema 03/31/2017  . Seasonal allergic rhinitis 03/31/2017     SUBJECTIVE: Aaron Lee is a 29 y.o. AA male with HIV diagnosed in November 2018 with CD4 nadir < 10, VL 192,000 copies.  HIV Risk: bisexual.  OI Hx: shingles  Previous Regimens:   Biktarvy --> suppressed (changed to PI for adherence concern)  Symtuza 2020  Genotype:   07/2017 - K103S (R-nevirapine)    CC:  Routine follow up on HIV adherence counseling, AIDS+    HPI: He states he has done about 80% with regards to adherence of Biktarvy. He has not noticed any more hair loss but he thinks his hair has "stopped growing."   The prednisone seemed to help his ezcema a lot with regards to itching. Dermatology attempted to contact him for an appointment but he has since changed his number - this was updated in our system today.     Review of Systems  Constitutional: Negative for appetite change, chills, fatigue, fever and unexpected weight change.  Eyes: Negative for visual disturbance.  Respiratory: Negative for cough and shortness of breath.   Cardiovascular: Negative for chest pain and leg swelling.  Gastrointestinal: Negative for abdominal pain, diarrhea and nausea.  Genitourinary: Negative for discharge, dysuria and genital sores.  Musculoskeletal: Negative for joint swelling.  Skin: Positive for rash. Negative for color change.    Neurological: Negative for dizziness and headaches.  Hematological: Negative for adenopathy.  Psychiatric/Behavioral: Negative for sleep disturbance. The patient is not nervous/anxious.      Past Medical History:  Diagnosis Date  . Eczema   . HIV (human immunodeficiency virus infection) (HCC)   . Shingles outbreak 09/26/2018    Social History   Tobacco Use  . Smoking status: Light Tobacco Smoker    Packs/day: 0.10    Types: Cigarettes  . Smokeless tobacco: Never Used  Substance Use Topics  . Alcohol use: No    Comment: occ  . Drug use: Yes    Frequency: 7.0 times per week    Types: Marijuana   He  reports being sexually active and has had partner(s) who are Male and Male. He reports using the following method of birth control/protection: Condom.  No Known Allergies   Outpatient Medications Prior to Visit  Medication Sig Dispense Refill  . bictegravir-emtricitabine-tenofovir AF (BIKTARVY) 50-200-25 MG TABS tablet Take 1 tablet by mouth daily. 30 tablet 11  . hydrALAZINE (APRESOLINE) 25 MG tablet Take 1 tablet (25 mg total) by mouth 3 (three) times daily. 60 tablet 0  . hydrocortisone 1 % lotion Apply 1 application topically 2 (two) times daily. 118 mL 0  . hydrocortisone 2.5 % cream Apply topically 2 (two) times daily. To anterior chest 30 g 3  . sulfamethoxazole-trimethoprim (BACTRIM DS) 800-160 MG tablet TAKE 1 TABLET BY MOUTH DAILY. (Patient not taking: Reported on 01/04/2020) 30 tablet 5  . valACYclovir (VALTREX) 1000 MG tablet Take 1  tablet (1,000 mg total) by mouth daily. (Patient not taking: Reported on 01/31/2020) 30 tablet 3   No facility-administered medications prior to visit.           Objective: Vitals:   01/31/20 1439  BP: 126/83  Pulse: 93  Temp: 98.1 F (36.7 C)  SpO2: 100%    Physical Exam Vitals and nursing note reviewed.  Constitutional:      Appearance: He is well-developed.     Comments: Thin appearing, well developed.   HENT:     Head:      Comments: Diffuse loss of hair overlying scalp. There is no pattern or patches, no rashes or trauma to hair. Generalized thinning noted.  He has lost nearly all of his eyebrows and eye lashes.     Mouth/Throat:     Mouth: Mucous membranes are moist. No oral lesions.     Dentition: Normal dentition. No dental abscesses.     Tongue: No lesions.     Pharynx: Oropharynx is clear.  Eyes:     General: No scleral icterus.    Pupils: Pupils are equal, round, and reactive to light.  Cardiovascular:     Rate and Rhythm: Normal rate and regular rhythm.     Heart sounds: Normal heart sounds.  Pulmonary:     Effort: Pulmonary effort is normal.     Breath sounds: Normal breath sounds.  Abdominal:     General: There is no distension.     Palpations: Abdomen is soft.     Tenderness: There is no abdominal tenderness.  Lymphadenopathy:     Cervical: No cervical adenopathy.  Skin:    General: Skin is warm and dry.     Capillary Refill: Capillary refill takes less than 2 seconds.     Findings: No rash.     Comments: Various wide spread patches of eczema noted along antecubital fossa b/l extending down forearm, side of neck, back, chest torso and glutes in various stages of healing. Skin is not overtly inflamed despite multiple abrasions from scratching and there is no signs of secondary bacterial infections today.   Neurological:     Mental Status: He is alert and oriented to person, place, and time.  Psychiatric:        Mood and Affect: Mood normal.     Comments: Seems to be in a good mood today.      Lab Results Lab Results  Component Value Date   WBC 2.7 (L) 07/14/2018   HGB 13.1 (L) 07/14/2018   HCT 40.0 07/14/2018   MCV 86.8 07/14/2018   PLT 79 (L) 07/14/2018    Lab Results  Component Value Date   CREATININE 1.18 05/16/2018   BUN 16 05/16/2018   NA 143 05/16/2018   K 3.9 05/16/2018   CL 107 05/16/2018   CO2 29 05/16/2018    Lab Results  Component Value Date   ALT 12  05/16/2018   AST 20 05/16/2018   BILITOT 0.4 05/16/2018    HIV 1 RNA Quant (copies/mL)  Date Value  11/01/2019 716,000 (H)  08/31/2019 351,000 (H)  08/01/2019 311,000 (H)   CD4 T Cell Abs (/uL)  Date Value  08/01/2019 <35 (L)  06/19/2019 50 (L)  04/18/2019 45 (L)    Assessment and Plan: Problem List Items Addressed This Visit      High   AIDS (acquired immune deficiency syndrome) (HCC) (Chronic)    Explained that the changes with his hair including loss is related to AIDS.  I am not sure he 100% understands.  Hopefully his adherence has improved. Will repeat VL today and have him return in 1 month for repeat assessment. If he can get suppressed or nearly so I would feel comfortable switching him to [facility] for treatment.  He will meet with Marcie Bal in 2 weeks as scheduled to explore anger/depression as it relates to medication adherence.       Relevant Orders   HIV-1 RNA quant-no reflex-bld   HIV disease ([place]) - Primary (Chronic)     Unprioritized   Eczema    Ongoing - explained I cannot keep him on prednisone long term because of further suppression of immune system. He did not respond to dermatology - will try again now that we have a number for him.       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          RTC in 1 month   Janene Madeira, MSN, NP-C [email or web address] [email or web address] Pager: [phone] Office: [place]: [phone]  01/31/2020

## 2020-02-02 LAB — HIV-1 RNA QUANT-NO REFLEX-BLD
HIV 1 RNA Quant: 35000 copies/mL — ABNORMAL HIGH
HIV-1 RNA Quant, Log: 4.54 Log copies/mL — ABNORMAL HIGH

## 2020-02-06 ENCOUNTER — Telehealth: Payer: Self-pay | Admitting: *Deleted

## 2020-02-06 NOTE — Telephone Encounter (Signed)
Relayed to Wallace. He was happy, asked me to call his mother to tell her as well.  RN called Cordelia Pen, shared the good news with her as well.  SHe will keep encouraging him to stay on the right track. Andree Coss, RN

## 2020-02-06 NOTE — Telephone Encounter (Signed)
-----   Message from Blanchard Kelch, NP sent at 02/06/2020  3:33 PM EDT ----- [name] can you try to reach [facility] for me another day this week? I want to tell him his viral load and try to give him some encouragement that he seems to be doing better to get his Biktarvy in .   I tried to reach him on his cell and he picked up once but hung up. Thinking a clinic number would be better chance he picks up.   Thank you,  Judeth Cornfield

## 2020-02-15 ENCOUNTER — Ambulatory Visit: Payer: Medicaid Other

## 2020-02-15 ENCOUNTER — Other Ambulatory Visit: Payer: Self-pay

## 2020-02-26 ENCOUNTER — Encounter: Payer: Self-pay | Admitting: Infectious Diseases

## 2020-02-26 ENCOUNTER — Ambulatory Visit (INDEPENDENT_AMBULATORY_CARE_PROVIDER_SITE_OTHER): Payer: Medicaid Other | Admitting: Infectious Diseases

## 2020-02-26 ENCOUNTER — Other Ambulatory Visit: Payer: Self-pay

## 2020-02-26 VITALS — BP 116/75 | HR 87 | Temp 97.7°F | Ht 63.0 in | Wt 109.0 lb

## 2020-02-26 DIAGNOSIS — L309 Dermatitis, unspecified: Secondary | ICD-10-CM

## 2020-02-26 DIAGNOSIS — B20 Human immunodeficiency virus [HIV] disease: Secondary | ICD-10-CM

## 2020-02-26 MED ORDER — HYDROCORTISONE 1 % EX LOTN: 1.0000 "application " | TOPICAL_LOTION | Freq: Two times a day (BID) | CUTANEOUS | 11 refills | Status: DC

## 2020-02-26 MED ORDER — HYDROCORTISONE 2.5 % EX CREA: TOPICAL_CREAM | Freq: Two times a day (BID) | CUTANEOUS | 11 refills | Status: DC

## 2020-02-26 NOTE — Progress Notes (Signed)
Name: Aaron Lee  DOB: [birth year]  MRN: [id number]  PCP: Patient, No Pcp Per    Patient Active Problem List   Diagnosis Date Noted  . HIV disease (HCC) 02/23/2019    Priority: High  . AIDS (acquired immune deficiency syndrome) (HCC) 07/26/2017    Priority: High  . Alopecia 01/04/2020  . 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 Noncompliance with medication regimen 02/23/2019  . History of shingles 09/26/2018  . Cognitive developmental delay 10/01/2017  . History of syphilis 08/26/2017  . Anemia 08/02/2017  . Healthcare maintenance 08/02/2017  . Eczema 03/31/2017  . Seasonal allergic rhinitis 03/31/2017     SUBJECTIVE: Aaron Lee is a 29 y.o. AA male with HIV diagnosed in November 2018 with CD4 nadir < 10, VL 192,000 copies.  HIV Risk: bisexual.  OI Hx: shingles  Previous Regimens:   Biktarvy --> suppressed (changed to PI for adherence concern)  Symtuza 2020  Genotype:   07/2017 - K103S (R-nevirapine)    CC:  Routine follow up on HIV adherence counseling, AIDS+    HPI: Here for another 1 month follow up for HIV care, AIDS+. He had a decent drop in his viral load over the last month with increased attention to adherence. We have been working with [name] to uncover some deeper issues surrounding anger over his diagnosis and he has an appointment with Marylu Lund tomorrow for counseling.   Dionisios states he has been taking his medications every morning during the week for the most part. Took it today at 900. He states he is feeling better since getting back on medication regularly.   He is very hopeful to transition to injectable regimen Cabenuva if we can get his viral load down more.     Review of Systems  Constitutional: Negative for appetite change, chills, fatigue, fever and unexpected weight change.  Eyes: Negative for visual disturbance.  Respiratory: Negative for cough and shortness of breath.   Cardiovascular: Negative for chest pain and  leg swelling.  Gastrointestinal: Negative for abdominal pain, diarrhea and nausea.  Genitourinary: Negative for discharge, dysuria and genital sores.  Musculoskeletal: Negative for joint swelling.  Skin: Positive for rash. Negative for color change.  Neurological: Negative for dizziness and headaches.  Hematological: Negative for adenopathy.  Psychiatric/Behavioral: Negative for sleep disturbance. The patient is not nervous/anxious.      Past Medical History:  Diagnosis Date  . Eczema   . HIV (human immunodeficiency virus infection) (HCC)   . Shingles outbreak 09/26/2018    Social History   Tobacco Use  . Smoking status: Light Tobacco Smoker    Packs/day: 0.10    Types: Cigarettes  . Smokeless tobacco: Never Used  Substance Use Topics  . Alcohol use: No    Comment: occ  . Drug use: Yes    Frequency: 7.0 times per week    Types: Marijuana   He  reports being sexually active and has had partner(s) who are Male and Male. He reports using the following method of birth control/protection: Condom.  No Known Allergies   Outpatient Medications Prior to Visit  Medication Sig Dispense Refill  . bictegravir-emtricitabine-tenofovir AF (BIKTARVY) 50-200-25 MG TABS tablet Take 1 tablet by mouth daily. 30 tablet 11  . hydrALAZINE (APRESOLINE) 25 MG tablet Take 1 tablet (25 mg total) by mouth 3 (three) times daily. 60 tablet 0  . sulfamethoxazole-trimethoprim (BACTRIM DS) 800-160 MG tablet TAKE 1 TABLET BY MOUTH DAILY. 30 tablet 5  . valACYclovir (VALTREX) 1000 MG  tablet Take 1 tablet (1,000 mg total) by mouth daily. 30 tablet 3  . hydrocortisone 1 % lotion Apply 1 application topically 2 (two) times daily. 118 mL 0  . hydrocortisone 2.5 % cream Apply topically 2 (two) times daily. To anterior chest 30 g 3   No facility-administered medications prior to visit.           Objective: Vitals:   02/26/20 1452  BP: 116/75  Pulse: 87  Temp: 97.7 F (36.5 C)  SpO2: 100%    Physical  Exam Vitals and nursing note reviewed.  Constitutional:      Appearance: He is well-developed.     Comments: Thin appearing, well developed.   HENT:     Mouth/Throat:     Mouth: Mucous membranes are moist. No oral lesions.     Dentition: Normal dentition. No dental abscesses.     Tongue: No lesions.     Pharynx: Oropharynx is clear.  Eyes:     General: No scleral icterus.    Pupils: Pupils are equal, round, and reactive to light.  Cardiovascular:     Rate and Rhythm: Normal rate and regular rhythm.     Heart sounds: Normal heart sounds.  Pulmonary:     Effort: Pulmonary effort is normal.     Breath sounds: Normal breath sounds.  Abdominal:     General: There is no distension.     Palpations: Abdomen is soft.     Tenderness: There is no abdominal tenderness.  Lymphadenopathy:     Cervical: No cervical adenopathy.  Skin:    General: Skin is warm and dry.     Capillary Refill: Capillary refill takes less than 2 seconds.     Findings: No rash.     Comments: Various wide spread patches of eczema noted along antecubital fossa b/l extending down forearm, side of neck, back, chest torso and glutes in various stages of healing. Skin is not overtly inflamed despite multiple abrasions from scratching and there is no signs of secondary bacterial infections today.   Neurological:     Mental Status: He is alert and oriented to person, place, and time.  Psychiatric:        Mood and Affect: Mood normal.     Comments: Seems to be in a good mood today.      Lab Results Lab Results  Component Value Date   WBC 2.7 (L) 07/14/2018   HGB 13.1 (L) 07/14/2018   HCT 40.0 07/14/2018   MCV 86.8 07/14/2018   PLT 79 (L) 07/14/2018    Lab Results  Component Value Date   CREATININE 1.18 05/16/2018   BUN 16 05/16/2018   NA 143 05/16/2018   K 3.9 05/16/2018   CL 107 05/16/2018   CO2 29 05/16/2018    Lab Results  Component Value Date   ALT 12 05/16/2018   AST 20 05/16/2018   BILITOT 0.4  05/16/2018    HIV 1 RNA Quant (copies/mL)  Date Value  01/31/2020 35,000 (H)  11/01/2019 716,000 (H)  08/31/2019 351,000 (H)   CD4 T Cell Abs (/uL)  Date Value  08/01/2019 <35 (L)  06/19/2019 50 (L)  04/18/2019 45 (L)    Assessment and Plan: Problem List Items Addressed This Visit      High   AIDS (acquired immune deficiency syndrome) (HCC) (Chronic)    Will repeat CD4 today - Last assessed in November to be < 35. No findings for opportunistic process today.  Continue Bactrim once daily  for proph      HIV disease ([place]) - Primary (Chronic)    I am hopeful with his report on increased adherence. He is in better spirits today and does not smell of marijuana so hopeful he is getting things in line. He is aware of his appt with Marcie Bal tomorrow for counseling services to help with mental barriers for medication adherence.  VL today - will call him and his mother /guardian once available.  Hopeful we can get him on Cabenuva soon.  RTC 1-2 months pending lab results.       Relevant Orders   HIV-1 RNA quant-no reflex-bld   T-helper cell (CD4)- ([facility] clinic only)     Unprioritized   Eczema    Refill hydrocortisone cream and lotion       Relevant Medications   hydrocortisone 1 % lotion   hydrocortisone 2.5 % cream      Janene Madeira, MSN, NP-C [facility] [email or web address] Pager: [phone] Office: [place]: [phone]  02/26/2020

## 2020-02-26 NOTE — Patient Instructions (Signed)
Please stop by the lab on your way out.  It sounds like you are doing a great job with your Biktarvy and Bactrim - please continue both of these once a day until we get your lab results back. I want to get you safely started on the shots very soon!!  Will plan on seeing you back in 2 months.   Call your pharmacy to refill your creams and lotion for your skin.    For your COVID Vaccine -  There are 3 options out currently to consider:  1. Pfizer - 2 doses 3 weeks apart, 95% protective against severe disease  2. Moderna - 2 doses 4 weeks apart, 95% protective against severe disease   3. Johnsen & Johnsen - 1 dose, 70% protective against severe disease   It is still possible to contract COVID19 infection after the vaccine but the goal is to reduce the likelihood of any symptoms, especially severe symptoms. That is the true benefit of the vaccine!  If you opt for the two dose shot it takes both shots to be fully vaccinated   After any vaccine it takes your body at least 2 weeks to create the protective antibodies so please stay cautious about avoiding exposures during this time.    To Schedule at [facility] -   [email or web address]  Health Department -   Text "[email or web address]" to [phone] to get a link to schedule through the [facility] Department.   Other Vaccine Locations: Find all vaccine locations throughout the state of [place] at   [email or web address]  There are also walk in appointments at the [facility] Monday & Thursday 8-5 and Saturday 8-12

## 2020-02-26 NOTE — Assessment & Plan Note (Signed)
Will repeat CD4 today - Last assessed in November to be < 35. No findings for opportunistic process today.  Continue Bactrim once daily for proph

## 2020-02-26 NOTE — Assessment & Plan Note (Signed)
Refill hydrocortisone cream and lotion

## 2020-02-26 NOTE — Assessment & Plan Note (Signed)
I am hopeful with his report on increased adherence. He is in better spirits today and does not smell of marijuana so hopeful he is getting things in line. He is aware of his appt with Marylu Lund tomorrow for counseling services to help with mental barriers for medication adherence.  VL today - will call him and his mother /guardian once available.  Hopeful we can get him on Cabenuva soon.  RTC 1-2 months pending lab results.

## 2020-02-27 ENCOUNTER — Telehealth: Payer: Self-pay | Admitting: Pharmacist

## 2020-02-27 ENCOUNTER — Ambulatory Visit: Payer: Medicaid Other

## 2020-02-27 LAB — T-HELPER CELL (CD4) - (RCID CLINIC ONLY)
CD4 % Helper T Cell: 4 % — ABNORMAL LOW (ref 33–65)
CD4 T Cell Abs: <35 /uL — ABNORMAL LOW (ref 400–1790)

## 2020-02-27 NOTE — Telephone Encounter (Signed)
Faxed patient's Cabenuva enrollment form to [employer] today. They will assess patient's insurance and send a benefits investigation form detailing how to start the injections (where to get it from and cost). Once benefits investigation is complete, we will order oral lead-in therapy and start patient on treatment. Will update encounter with further details when available. 

## 2020-02-28 LAB — HIV-1 RNA QUANT-NO REFLEX-BLD
HIV 1 RNA Quant: 409000 copies/mL — ABNORMAL HIGH
HIV-1 RNA Quant, Log: 5.61 Log copies/mL — ABNORMAL HIGH

## 2020-03-04 ENCOUNTER — Telehealth: Payer: Self-pay | Admitting: *Deleted

## 2020-03-04 NOTE — Telephone Encounter (Signed)
Relayed results, concerns, and message that we cannot start cabenuva at this time.  Encouraged him to start the biktarvy today, and to take it every day, and that we will check in with him at his visit with Marylu Lund. Andree Coss, RN

## 2020-03-04 NOTE — Telephone Encounter (Signed)
-----   Message from Blanchard Kelch, NP sent at 03/04/2020 10:18 AM EDT ----- [name] would you mind giving Aaron Lee a call to let him know his viral load has increased since last visit indicating he isn't getting his Biktarvy in enough if at all. I unfortunately cannot switch him to [place].  Hopefully him meeting with Marylu Lund will help adherence.

## 2020-03-19 ENCOUNTER — Other Ambulatory Visit: Payer: Self-pay

## 2020-03-19 ENCOUNTER — Ambulatory Visit: Payer: Medicaid Other

## 2020-03-21 MED FILL — BIKTARVY 50-200-25 MG TABS: 50-200-25 | 30 days supply | Qty: 30 | Fill #2

## 2020-04-02 ENCOUNTER — Other Ambulatory Visit: Payer: Self-pay

## 2020-04-02 ENCOUNTER — Ambulatory Visit: Payer: Medicaid Other

## 2020-04-16 ENCOUNTER — Ambulatory Visit: Payer: Medicaid Other

## 2020-04-16 ENCOUNTER — Other Ambulatory Visit: Payer: Self-pay

## 2020-04-16 MED FILL — BIKTARVY 50-200-25 MG TABS: 50-200-25 | 30 days supply | Qty: 30 | Fill #3

## 2020-04-25 ENCOUNTER — Encounter: Payer: Self-pay | Admitting: Infectious Diseases

## 2020-04-25 ENCOUNTER — Other Ambulatory Visit: Payer: Self-pay

## 2020-04-25 ENCOUNTER — Other Ambulatory Visit: Payer: Self-pay | Admitting: Infectious Diseases

## 2020-04-25 ENCOUNTER — Ambulatory Visit (INDEPENDENT_AMBULATORY_CARE_PROVIDER_SITE_OTHER): Payer: Medicaid Other | Admitting: Infectious Diseases

## 2020-04-25 VITALS — BP 122/77 | HR 99 | Temp 97.7°F | Wt 104.0 lb

## 2020-04-25 DIAGNOSIS — L309 Dermatitis, unspecified: Secondary | ICD-10-CM

## 2020-04-25 DIAGNOSIS — B20 Human immunodeficiency virus [HIV] disease: Secondary | ICD-10-CM | POA: Diagnosis not present

## 2020-04-25 DIAGNOSIS — J302 Other seasonal allergic rhinitis: Secondary | ICD-10-CM

## 2020-04-25 DIAGNOSIS — Z91148 Patient's other noncompliance with medication regimen for other reason: Secondary | ICD-10-CM

## 2020-04-25 DIAGNOSIS — L0201 Cutaneous abscess of face: Secondary | ICD-10-CM | POA: Diagnosis not present

## 2020-04-25 DIAGNOSIS — Z9114 Patient's other noncompliance with medication regimen: Secondary | ICD-10-CM

## 2020-04-25 MED ORDER — SULFAMETHOXAZOLE-TRIMETHOPRIM 400-80 MG PO TABS
1.0000 | ORAL_TABLET | Freq: Every day | ORAL | 11 refills | Status: DC
Start: 2020-04-25 — End: 2020-10-02

## 2020-04-25 MED ORDER — AMOXICILLIN-POT CLAVULANATE 875-125 MG PO TABS: 1.0000 | ORAL_TABLET | Freq: Two times a day (BID) | ORAL | 0 refills | Status: DC

## 2020-04-25 MED FILL — SULFAMETHOXAZOLE-TMP SS TAB: 400-80 | 30 days supply | Qty: 30 | Fill #0

## 2020-04-25 MED FILL — AMOX-CLAV 875-125 MG TABLET: 875-125 | 7 days supply | Qty: 14 | Fill #0

## 2020-04-25 NOTE — Patient Instructions (Signed)
You have an infection on your skin in several areas from picking and scratching at your face.  Please try to keep your hands off rashes and keep hands clean with frequent washing with soap and water.  Please start taking AUGMENTIN (antibiotic) ONE tablet TWICE a day for infection.  If you have no improvement or it gets worse to where you have too much swelling to your eye or eye pain please go to the hospital for IV antibiotic treatment.   You need to take your Biktarvy every day to get yourself better. I worry you will become sicker and sicker and need to be hospitalized.

## 2020-04-25 NOTE — Progress Notes (Signed)
Name: Aaron Lee  DOB: Aug 17, 1991  MRN: [id number]  PCP: Patient, No Pcp Per    Patient Active Problem List   Diagnosis Date Noted  . HIV disease (HCC) 02/23/2019    Priority: High  . AIDS (acquired immune deficiency syndrome) (HCC) 07/26/2017    Priority: High  . Facial abscess 05/05/2020  . Alopecia 01/04/2020  . 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 Noncompliance with medication regimen 02/23/2019  . History of shingles 09/26/2018  . Cognitive developmental delay 10/01/2017  . History of syphilis 08/26/2017  . Anemia 08/02/2017  . Healthcare maintenance 08/02/2017  . Eczema 03/31/2017  . Seasonal allergic rhinitis 03/31/2017     SUBJECTIVE: Aaron Lee is a 29 y.o. male with HIV and AIDS in November 2018 with CD4 nadir < 10, VL 192,000 copies.  HIV Risk: bisexual.  OI Hx: shingles  Previous Regimens:   Biktarvy --> suppressed (changed to PI for adherence concern)  Symtuza 2020  Genotype:   07/2017 - K103S (R-nevirapine)    CC:  Routine follow up on HIV adherence counseling, AIDS+ Facial swelling and abscesses to his skin.     HPI: Still reporting very spotty adherence to his Biktarvy.  He overall hates taking it every day and does not feel like he should have to take his medicine.  He is not taking his Bactrim antibiotic either.  His father is here today with him and frustrated over Aaron Lee's inability to take his medication.  He is also concerned with ongoing weight loss that he has noticed with Aaron Lee despite him eating heartily.  Aaron Lee is very quiet and withdrawn.  He has had swelling to his nasal bridge under the left eye with a lump that is draining now for a week or so.  He also has other ulcerations on the upper left eyebrow that have not healed but are not swollen any longer.  Few spots here and there on his left anterior neck.  He is not had any antibiotics or care for these lesions.  He does frequently pick at his skin and bite  his nails.    Review of Systems  Constitutional: Positive for unexpected weight change. Negative for appetite change, chills, fatigue and fever.  HENT: Positive for facial swelling. Negative for ear pain and mouth sores.   Eyes: Negative for visual disturbance.  Respiratory: Negative for cough and shortness of breath.   Cardiovascular: Negative for chest pain and leg swelling.  Gastrointestinal: Negative for abdominal pain, diarrhea and nausea.  Genitourinary: Negative for discharge, dysuria and genital sores.  Musculoskeletal: Negative for joint swelling.  Skin: Positive for rash. Negative for color change.  Neurological: Negative for dizziness and headaches.  Hematological: Negative for adenopathy.  Psychiatric/Behavioral: Negative for sleep disturbance. The patient is not nervous/anxious.      Past Medical History:  Diagnosis Date  . Eczema   . HIV (human immunodeficiency virus infection) (HCC)   . Shingles outbreak 09/26/2018    Social History   Tobacco Use  . Smoking status: Light Tobacco Smoker    Packs/day: 0.10    Types: Cigarettes  . Smokeless tobacco: Never Used  Substance Use Topics  . Alcohol use: No    Comment: occ  . Drug use: Not Currently    Frequency: 7.0 times per week    Types: Marijuana   He  reports being sexually active and has had partner(s) who are male and male. He reports using the following method of birth control/protection: Condom.  No Known Allergies   Outpatient Medications Prior to Visit  Medication Sig Dispense Refill  . bictegravir-emtricitabine-tenofovir AF (BIKTARVY) 50-200-25 MG TABS tablet Take 1 tablet by mouth daily. 30 tablet 11  . hydrocortisone 1 % lotion Apply 1 application topically 2 (two) times daily. (Patient not taking: Reported on 04/25/2020) 118 mL 11  . hydrocortisone 2.5 % cream Apply topically 2 (two) times daily. To anterior chest (Patient not taking: Reported on 04/25/2020) 30 g 11  . hydrALAZINE (APRESOLINE) 25 MG  tablet Take 1 tablet (25 mg total) by mouth 3 (three) times daily. (Patient not taking: Reported on 04/25/2020) 60 tablet 0  . sulfamethoxazole-trimethoprim (BACTRIM DS) 800-160 MG tablet TAKE 1 TABLET BY MOUTH DAILY. (Patient not taking: Reported on 04/25/2020) 30 tablet 5  . valACYclovir (VALTREX) 1000 MG tablet Take 1 tablet (1,000 mg total) by mouth daily. (Patient not taking: Reported on 04/25/2020) 30 tablet 3   No facility-administered medications prior to visit.           Objective: Vitals:   04/25/20 1514  BP: 122/77  Pulse: 99  Temp: 97.7 F (36.5 C)  SpO2: 100%    Physical Exam Vitals and nursing note reviewed.  Constitutional:      Appearance: He is well-developed.     Comments: Thin appearing, well developed.   HENT:     Mouth/Throat:     Mouth: Mucous membranes are moist. No oral lesions.     Dentition: Normal dentition. No dental abscesses.     Tongue: No lesions.     Pharynx: Oropharynx is clear.  Eyes:     General: No scleral icterus.    Pupils: Pupils are equal, round, and reactive to light.  Cardiovascular:     Rate and Rhythm: Normal rate and regular rhythm.     Heart sounds: Normal heart sounds.  Pulmonary:     Effort: Pulmonary effort is normal.     Breath sounds: Normal breath sounds.  Abdominal:     General: There is no distension.     Palpations: Abdomen is soft.     Tenderness: There is no abdominal tenderness.  Lymphadenopathy:     Cervical: No cervical adenopathy.  Skin:    General: Skin is warm and dry.     Capillary Refill: Capillary refill takes less than 2 seconds.     Findings: No rash.  Neurological:     Mental Status: He is alert and oriented to person, place, and time.  Psychiatric:        Mood and Affect: Mood normal.     Comments: Seems to be in a good mood today.           Lab Results Lab Results  Component Value Date   WBC 2.7 (L) 07/14/2018   HGB 13.1 (L) 07/14/2018   HCT 40.0 07/14/2018   MCV 86.8 07/14/2018   PLT  79 (L) 07/14/2018    Lab Results  Component Value Date   CREATININE 1.18 05/16/2018   BUN 16 05/16/2018   NA 143 05/16/2018   K 3.9 05/16/2018   CL 107 05/16/2018   CO2 29 05/16/2018    Lab Results  Component Value Date   ALT 12 05/16/2018   AST 20 05/16/2018   BILITOT 0.4 05/16/2018    HIV 1 RNA Quant  Date Value  04/25/2020 335,000 Copies/mL (H)  02/26/2020 409,000 copies/mL (H)  01/31/2020 35,000 copies/mL (H)   CD4 T Cell Abs (/uL)  Date Value  02/26/2020 <35 (L)  08/01/2019 <35 (L)  06/19/2019 50 (L)    Assessment and Plan: Problem List Items Addressed This Visit      High   AIDS (acquired immune deficiency syndrome) (HCC) (Chronic)    Unfortunately Aaron Lee continues to do very poorly controlling his HIV.  We have tried various regimens, counseling, home health nurse, THP, engaging his family and multiple attempts at monthly visits with our team.  He is adherent with his appointments but he will not take his medication.  Unable to consider injectable regimen given extremely high viral load over 300,000 persistently.  With his father present today we discussed again natural progression of advancing HIV and AIDS.  Aaron Lee has had no improvement over the last 2-1/2 years in care and at arrival was already late stage for his condition.  At this point if he does not desire to take his medication I would rather have him stop to preserve treatment options in the future and not risk evolution of mutations with consistently poor adherence patterns.   I gave his father the opportunity to answer all the questions he has had.  Young did not have much to contribute today unfortunately.  I asked him to please resume his Bactrim for PJP prophylaxis daily in the hopes that he gets it in 3 times a week.  On exam he continues to have findings consistent with end-stage AIDS including weight loss, nonhealing skin lesions, alopecia.  Denies any diarrhea or abdominal pain.   We will recheck  viral load today and have him back in 3 months for another conversation surrounding adherence and readiness to treat.    Return in about 3 months (around 07/26/2020).       Relevant Medications   sulfamethoxazole-trimethoprim (BACTRIM) 400-80 MG tablet   Other Relevant Orders   HIV-1 RNA quant-no reflex-bld (Completed)     Unprioritized   Seasonal allergic rhinitis    He should begin taking either Zyrtec or Claritin once daily for allergic conjunctivitis.      Noncompliance with medication regimen    Very challenging case as detailed above.      Facial abscess    Spontaneously draining abscess next to the left nasal bridge under eye socket.  I was able to express significant purulent drainage in clinic today.  He has other ulcerations that appear clean and uninfected to the left eyebrow and left neck.  I explained to his father that I am always leery of giving Aaron Lee pills to take so if he has no improvement increased pain with moving his left eye, swelling or refusal to take antibiotic pills he should present to the emergency room for IV antibiotics.  We will treat with Augmentin to cover staph, strep and other oral flora given nail biting and picking at skin.      Eczema - Primary    He and his father asking for another referral to dermatology today.  Upon review they never answered previous attempts 6 months ago.  We will reenter referral on put his father's contact information to make appointment.  Discussed with them both that his inability to heal and frequent eczema flares will be directly impacted by his severely depleted immune system related to AIDS.  Not certain they will be able to offer much else aside topical therapy when the root of his issue is consistently not addressed.      Relevant Orders   Ambulatory referral to Dermatology     I spent in excess of 60 minutes with the  patient and his father reviewing the above including allowing for opportunity to ask ample  questions related to Aaron Lee's condition.  Aaron Alberts, MSN, NP-C [facility] for Infectious Disease [facility]  [email or web address] Pager: [phone] Office: [phone] [facility] Main Line: [phone]  05/05/2020

## 2020-04-30 LAB — HIV-1 RNA QUANT-NO REFLEX-BLD
HIV 1 RNA Quant: 335000 Copies/mL — ABNORMAL HIGH
HIV-1 RNA Quant, Log: 5.53 Log cps/mL — ABNORMAL HIGH

## 2020-05-03 NOTE — Progress Notes (Signed)
Can't say I am surprised unfortunately.

## 2020-05-04 NOTE — Progress Notes (Signed)
Aaron Lee sometime this week can you please call Dilyn to let him know that his viral load is extremely high.  I will try to get a hold of his father or mother this week also to inform them. If he is not going to accept taking medication daily we should probably offer him to stop taking it to preserve treatment options in the future should he accept it.

## 2020-05-05 DIAGNOSIS — L0201 Cutaneous abscess of face: Secondary | ICD-10-CM | POA: Insufficient documentation

## 2020-05-05 NOTE — Assessment & Plan Note (Signed)
Spontaneously draining abscess next to the left nasal bridge under eye socket.  I was able to express significant purulent drainage in clinic today.  He has other ulcerations that appear clean and uninfected to the left eyebrow and left neck.  I explained to his father that I am always leery of giving Aaron Lee pills to take so if he has no improvement increased pain with moving his left eye, swelling or refusal to take antibiotic pills he should present to the emergency room for IV antibiotics.  We will treat with Augmentin to cover staph, strep and other oral flora given nail biting and picking at skin.

## 2020-05-05 NOTE — Assessment & Plan Note (Signed)
Very challenging case as detailed above.

## 2020-05-05 NOTE — Assessment & Plan Note (Signed)
He should begin taking either Zyrtec or Claritin once daily for allergic conjunctivitis.

## 2020-05-05 NOTE — Assessment & Plan Note (Signed)
He and his father asking for another referral to dermatology today.  Upon review they never answered previous attempts 6 months ago.  We will reenter referral on put his father's contact information to make appointment.  Discussed with them both that his inability to heal and frequent eczema flares will be directly impacted by his severely depleted immune system related to AIDS.  Not certain they will be able to offer much else aside topical therapy when the root of his issue is consistently not addressed.

## 2020-05-05 NOTE — Assessment & Plan Note (Addendum)
Unfortunately Aaron Lee continues to do very poorly controlling his HIV.  We have tried various regimens, counseling, home health nurse, THP, engaging his family and multiple attempts at monthly visits with our team.  He is adherent with his appointments but he will not take his medication.  Unable to consider injectable regimen given extremely high viral load over 300,000 persistently.  With his father present today we discussed again natural progression of advancing HIV and AIDS.  Aaron Lee has had no improvement over the last 2-1/2 years in care and at arrival was already late stage for his condition.  At this point if he does not desire to take his medication I would rather have him stop to preserve treatment options in the future and not risk evolution of mutations with consistently poor adherence patterns.   I gave his father the opportunity to answer all the questions he has had.  Aaron Lee did not have much to contribute today unfortunately.  I asked him to please resume his Bactrim for PJP prophylaxis daily in the hopes that he gets it in 3 times a week.  On exam he continues to have findings consistent with end-stage AIDS including weight loss, nonhealing skin lesions, alopecia.  Denies any diarrhea or abdominal pain.   We will recheck viral load today and have him back in 3 months for another conversation surrounding adherence and readiness to treat.    Return in about 3 months (around 07/26/2020).

## 2020-05-07 ENCOUNTER — Other Ambulatory Visit: Payer: Self-pay

## 2020-05-07 ENCOUNTER — Ambulatory Visit: Payer: Medicaid Other | Admitting: *Deleted

## 2020-05-07 ENCOUNTER — Ambulatory Visit: Payer: Medicaid Other

## 2020-05-24 MED FILL — SULFAMETHOXAZOLE-TMP SS TAB: 400-80 | 30 days supply | Qty: 30 | Fill #1

## 2020-05-24 MED FILL — BIKTARVY 50-200-25 MG TABS: 50-200-25 | 30 days supply | Qty: 30 | Fill #4

## 2020-05-29 IMAGING — DX DG HAND COMPLETE 3+V*L*
3 series · 3 of 3 positions shown · non-contrast
Comparison: None.

CLINICAL DATA: Pain after punching wall

EXAM:
LEFT HAND - COMPLETE 3+ VIEW

[hand pa]
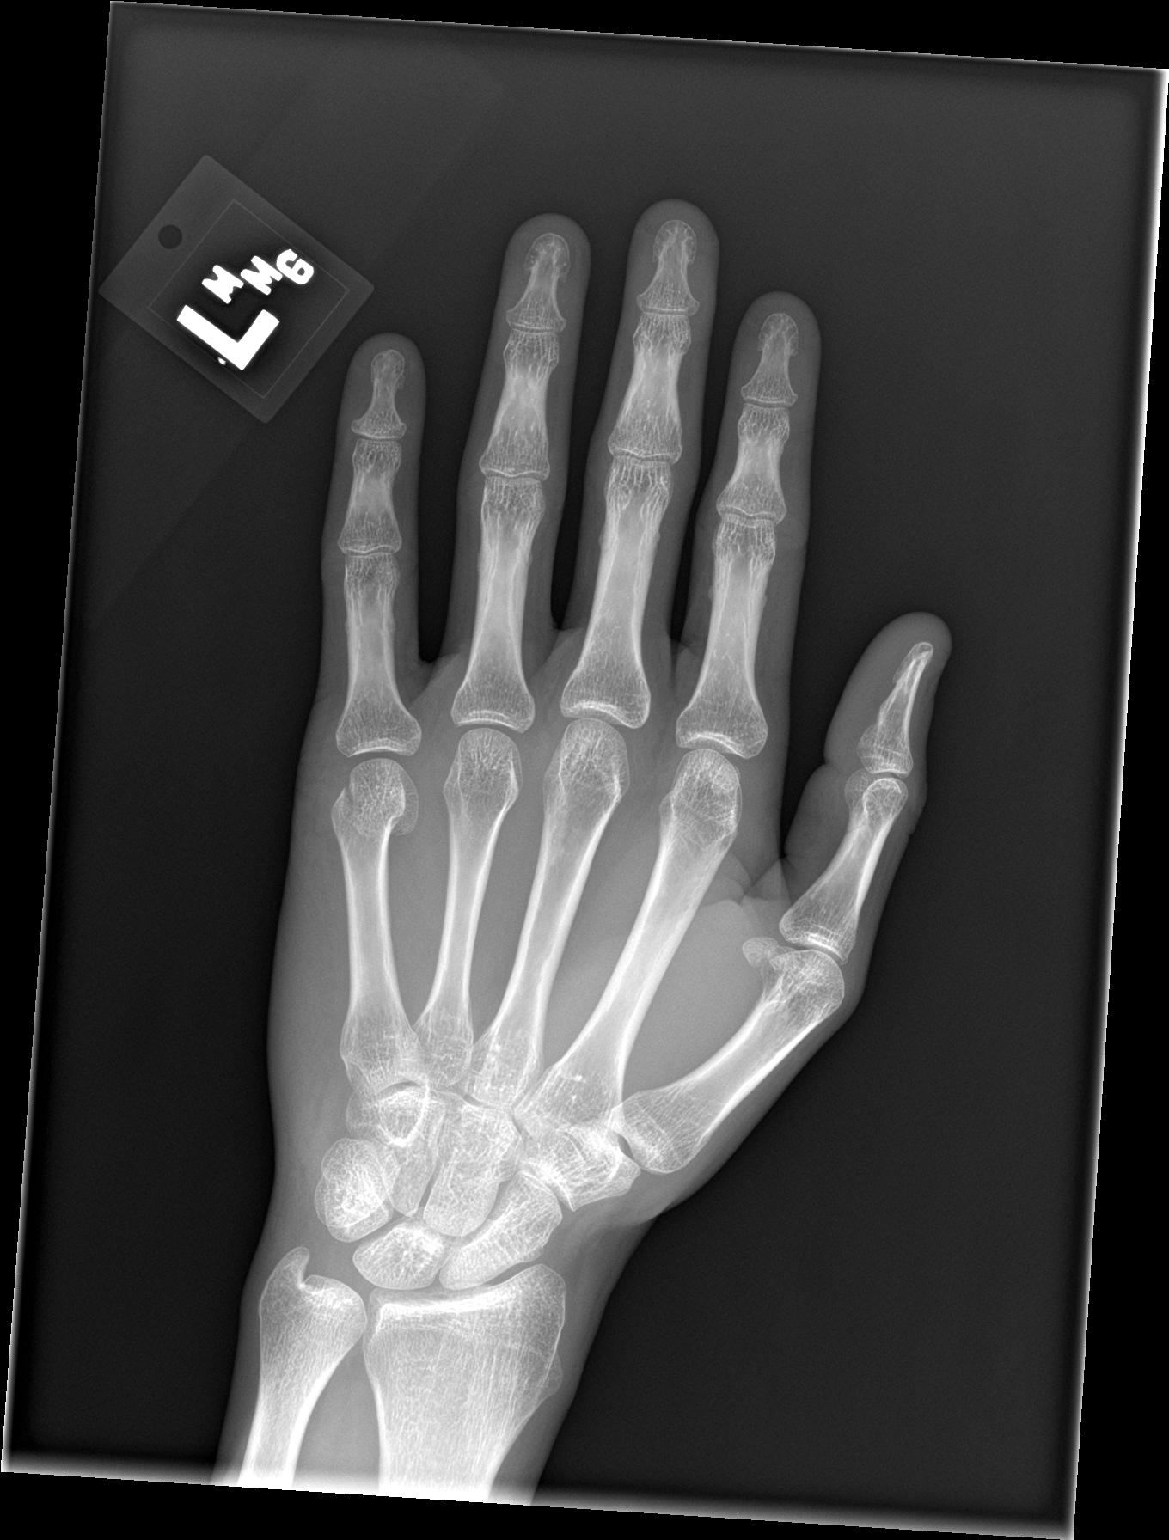

[hand obl]
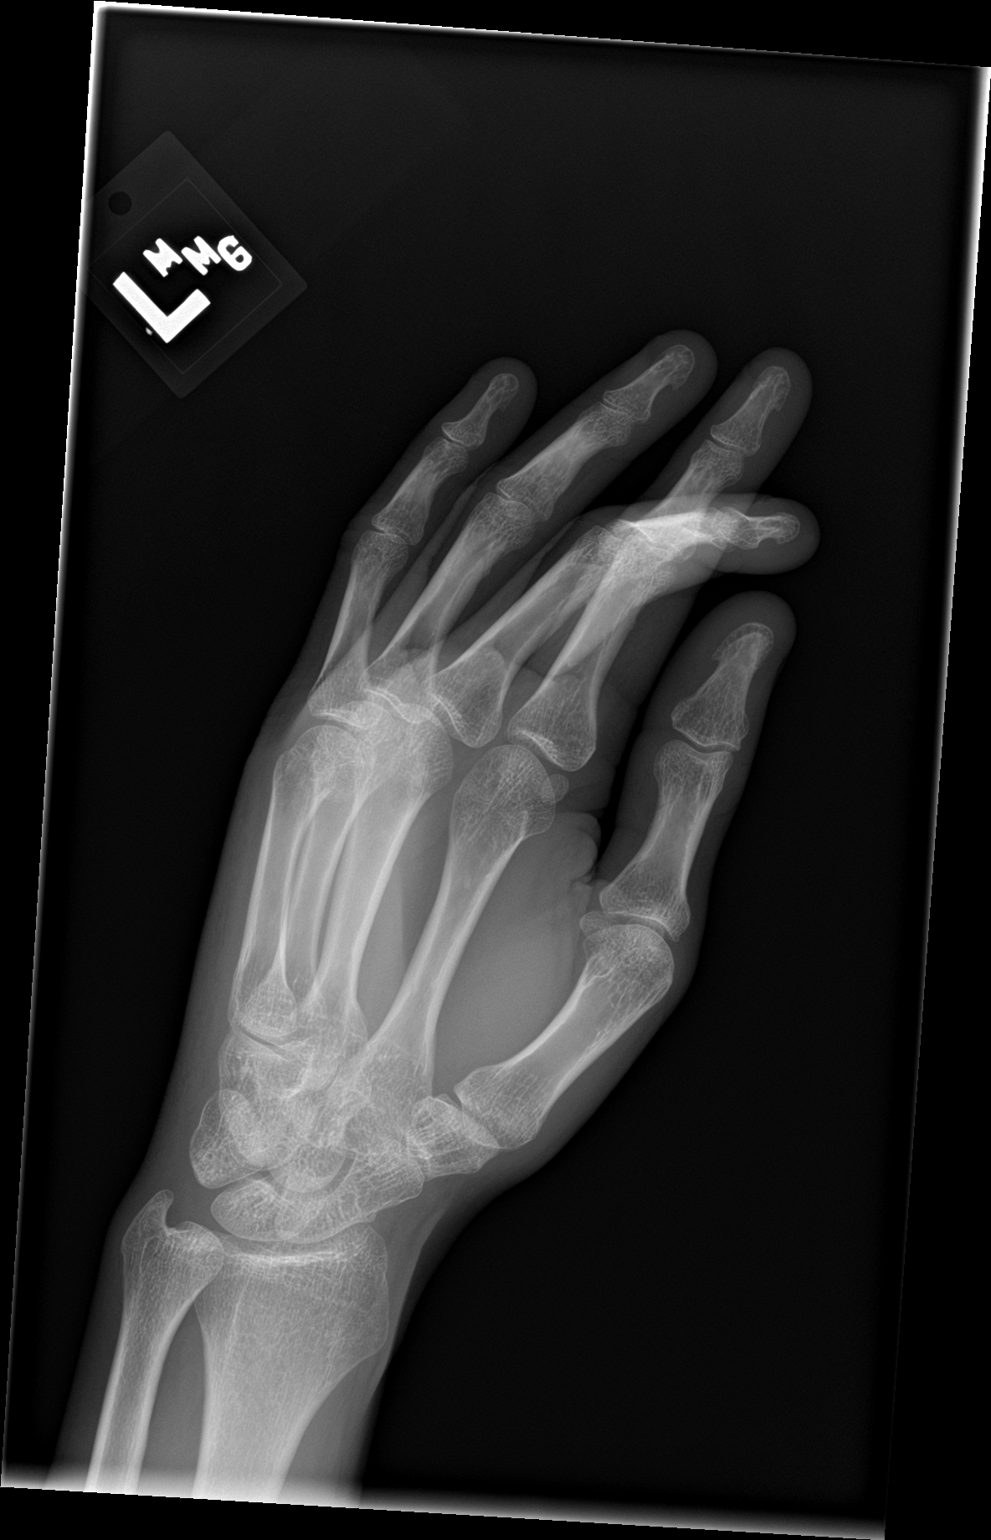

[hand lat]
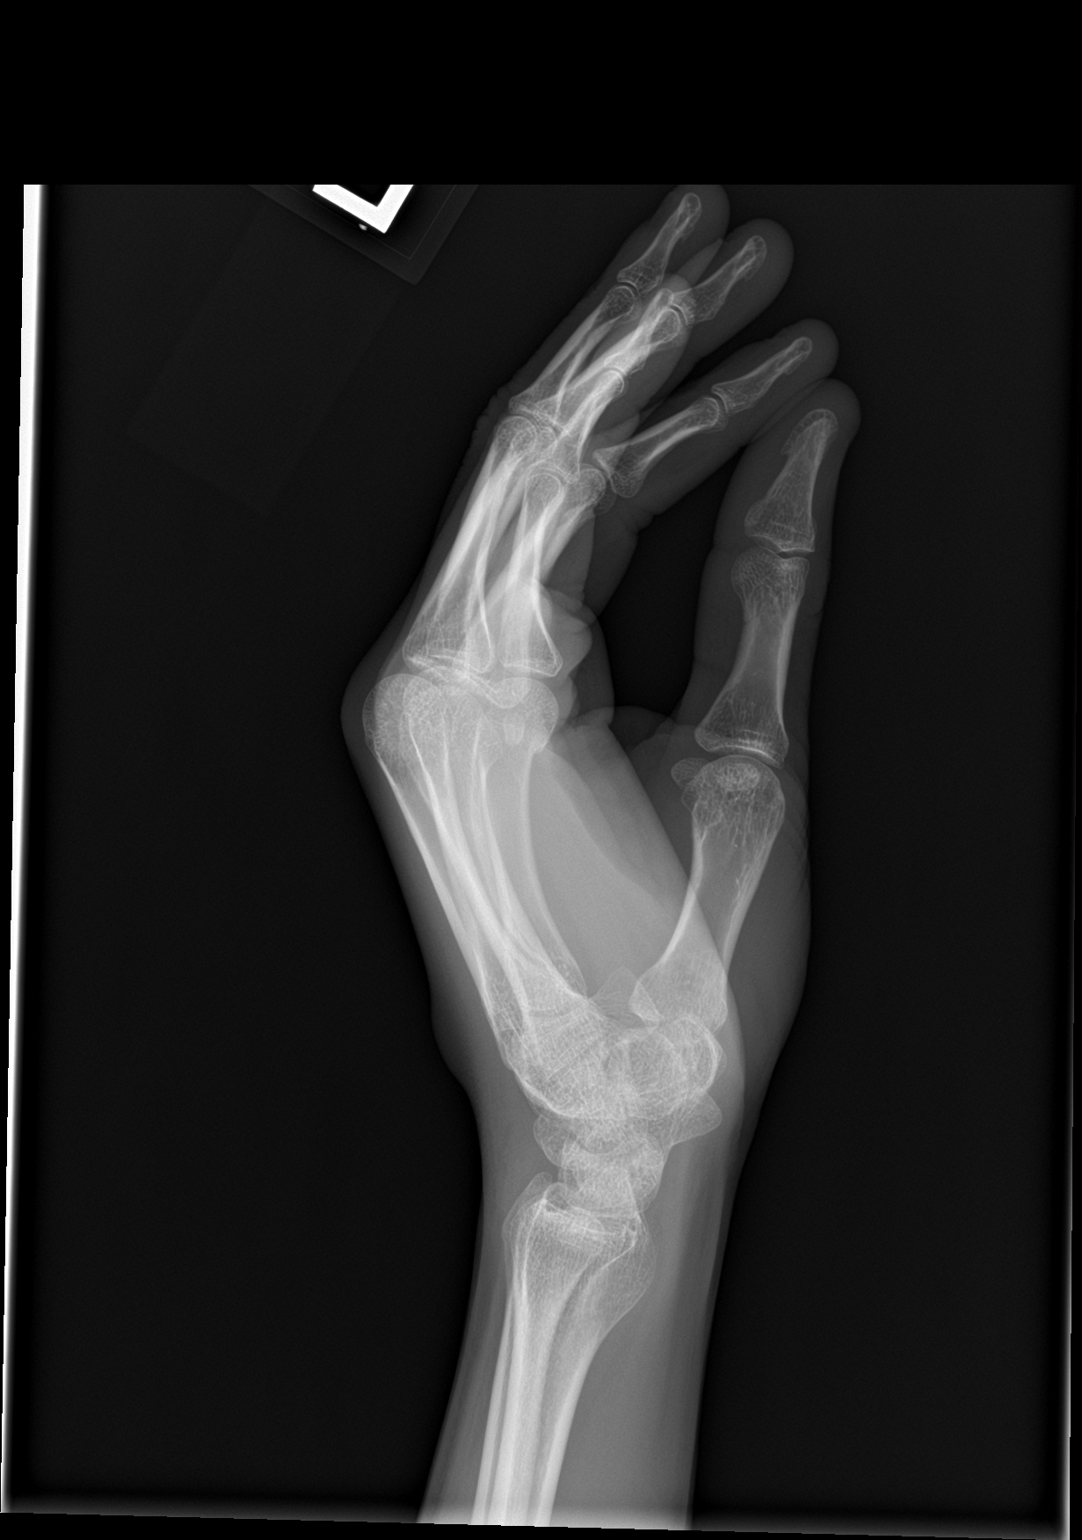

[3 of 3 positions shown; findings below may reference images not displayed]

FINDINGS: Frontal, oblique, and lateral views were obtained. There is a
fracture of the distal fifth metacarpal with volar angulation
distally. No other fracture. No dislocation. Joint spaces appear
normal. No erosive change.
IMPRESSION: Fracture distal fifth metacarpal with volar angulation distally. No
other fracture. No dislocation. No evident arthropathy.

## 2020-06-20 MED FILL — BIKTARVY 50-200-25 MG TABS: 50-200-25 | 30 days supply | Qty: 30 | Fill #5

## 2020-06-20 MED FILL — SULFAMETHOXAZOLE-TMP SS TAB: 400-80 | 30 days supply | Qty: 30 | Fill #2

## 2020-07-15 MED FILL — SULFAMETHOXAZOLE-TMP SS TAB: 400-80 | 30 days supply | Qty: 30 | Fill #3

## 2020-07-15 MED FILL — BIKTARVY 50-200-25 MG TABS: 50-200-25 | 30 days supply | Qty: 30 | Fill #6

## 2020-07-23 MED FILL — SULFAMETHOXAZOLE-TMP SS TAB: 400-80 | 30 days supply | Qty: 30 | Fill #3

## 2020-07-23 MED FILL — BIKTARVY 50-200-25 MG TABS: 50-200-25 | 30 days supply | Qty: 30 | Fill #6

## 2020-08-06 ENCOUNTER — Encounter: Payer: Self-pay | Admitting: Infectious Diseases

## 2020-08-06 ENCOUNTER — Other Ambulatory Visit: Payer: Self-pay

## 2020-08-06 ENCOUNTER — Ambulatory Visit (INDEPENDENT_AMBULATORY_CARE_PROVIDER_SITE_OTHER): Payer: Medicaid Other | Admitting: Infectious Diseases

## 2020-08-06 ENCOUNTER — Other Ambulatory Visit (HOSPITAL_COMMUNITY)
Admission: RE | Admit: 2020-08-06 | Discharge: 2020-08-06 | Disposition: A | Discharge status: Home / Self Care | Payer: Medicaid Other | Source: Ambulatory Visit | Attending: Infectious Diseases | Admitting: Infectious Diseases

## 2020-08-06 VITALS — BP 125/84 | HR 100 | Temp 98.0°F | Ht 63.0 in | Wt 103.8 lb

## 2020-08-06 DIAGNOSIS — B20 Human immunodeficiency virus [HIV] disease: Secondary | ICD-10-CM | POA: Insufficient documentation

## 2020-08-06 DIAGNOSIS — Z79899 Other long term (current) drug therapy: Secondary | ICD-10-CM | POA: Diagnosis not present

## 2020-08-06 MED ORDER — AZITHROMYCIN 600 MG PO TABS: 1200.0000 mg | ORAL_TABLET | ORAL | 11 refills | Status: DC

## 2020-08-06 NOTE — Patient Instructions (Signed)
Please try to get your biktarvy in 95% of the time. The important reason is to make sure your medication does not stop working altogether.   Continue your bactrim also once a day.   Will update some blood work today - please stop by the lab. Will need to make sure that your medication still can work.

## 2020-08-06 NOTE — Assessment & Plan Note (Signed)
Poor effort with adherence ongoing. Will check a full genotype to ensure [name] is still a viable option for treatment for him.   Vaccination counseling discussed at today's visit. Updated as outlined per recommendations for [facility] at today's visit. Scheduled him for Pfizer vaccine - will need 3 dose regimen d/t severely immunocompromised state.  Last colon cancer screening: n/a  Depression screening discussed today - no needs at this time.  Patient is receiving dental care through [facility] - patient has not scheduled appt. No needs identified today.  STI screening offered today. Condoms provided.   Will get him in to see THP - Megan helped him call to leave referral information on intake line.   FU in 8m.

## 2020-08-06 NOTE — Progress Notes (Signed)
u    Name: Aaron Lee  DOB: August 07, 1991  MRN: [id number]  PCP: Patient, No Pcp Per    Patient Active Problem List   Diagnosis Date Noted  . HIV disease ([place]) 02/23/2019    Priority: High  . AIDS (acquired immune deficiency syndrome) ([place]) 07/26/2017    Priority: High  . Alopecia 01/04/2020  . 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 Noncompliance with medication regimen 02/23/2019  . History of shingles 09/26/2018  . Cognitive developmental delay 10/01/2017  . History of syphilis 08/26/2017  . Anemia 08/02/2017  . Eczema 03/31/2017  . Seasonal allergic rhinitis 03/31/2017     SUBJECTIVE: Aaron Lee is a 29 y.o. male with HIV and AIDS in November 2018 with CD4 nadir < 10, VL 192,000 copies.  HIV Risk: bisexual.  OI Hx: shingles  Previous Regimens:   Biktarvy --> suppressed (changed to PI for adherence concern)  Symtuza 2020  Genotype:   07/2017 - K103S (R-nevirapine)  05-2019 - K103S, no integrase mutations   10/2019 - K103S, no integrase mutations.     Chief Complaint  Patient presents with  . Follow-up    given condoms   HIV / AIDS FU  No concerns today aside from needing housing support.     HPI: He tells me he has been doing better with adherence to his Bactrim and trying to do better about his biktarvy. Still 50-60% of doses over the course of a week is his best case scenario. The abscesses and lesions on his face have resolved completely. One small area that ruptured on the right aspect of his neck. Not painful currently.  Reports no complaints today suggestive of associated opportunistic infection specifically denying fevers, night sweats, weight loss, anorexia, cough, SOB, nausea, vomiting, diarrhea, headache, sensory changes, lymphadenopathy or oral thrush.   He hates talking about his medication and we spent some time discussing basketball today. He would be interested in working with a possible Firefighter. Needs housing  assistance - lives with his grandmother currently but "hates he cannot do what he wants there (ie: smoke)."     Review of Systems  Constitutional: Negative for appetite change, chills, fatigue, fever and unexpected weight change.  Eyes: Negative for visual disturbance.  Respiratory: Negative for cough and shortness of breath.   Cardiovascular: Negative for chest pain and leg swelling.  Gastrointestinal: Negative for abdominal pain, diarrhea and nausea.  Genitourinary: Negative for discharge, dysuria and genital sores.  Musculoskeletal: Negative for joint swelling.  Skin: Negative for color change and rash.  Neurological: Negative for dizziness and headaches.  Hematological: Negative for adenopathy.  Psychiatric/Behavioral: Negative for sleep disturbance. The patient is not nervous/anxious.      Past Medical History:  Diagnosis Date  . Eczema   . HIV (human immunodeficiency virus infection) ([place])   . Shingles outbreak 09/26/2018    Social History   Tobacco Use  . Smoking status: Light Tobacco Smoker    Packs/day: 0.10    Types: Cigarettes  . Smokeless tobacco: Never Used  Substance Use Topics  . Alcohol use: No    Comment: occ  . Drug use: Not Currently    Frequency: 7.0 times per week    Types: Marijuana    No Known Allergies   Outpatient Medications Prior to Visit  Medication Sig Dispense Refill  . bictegravir-emtricitabine-tenofovir AF (BIKTARVY) 50-200-25 MG TABS tablet Take 1 tablet by mouth daily. 30 tablet 11  . hydrocortisone 1 % lotion Apply 1 application topically 2 (  two) times daily. 118 mL 11  . hydrocortisone 2.5 % cream Apply topically 2 (two) times daily. To anterior chest 30 g 11  . sulfamethoxazole-trimethoprim (BACTRIM) 400-80 MG tablet Take 1 tablet by mouth daily. 30 tablet 11  . amoxicillin-clavulanate (AUGMENTIN) 875-125 MG tablet Take 1 tablet by mouth 2 (two) times daily. (Patient not taking: Reported on 08/06/2020) 14 tablet 0   No  facility-administered medications prior to visit.           Objective: Vitals:   08/06/20 1525  BP: 125/84  Pulse: 100  Temp: 98 F (36.7 C)  SpO2: 99%    Physical Exam Vitals and nursing note reviewed.  Constitutional:      Appearance: He is well-developed.     Comments: Thin appearing, well developed.   HENT:     Mouth/Throat:     Mouth: Mucous membranes are moist. No oral lesions.     Dentition: Normal dentition. No dental abscesses.     Tongue: No lesions.     Pharynx: Oropharynx is clear.  Eyes:     General: No scleral icterus.    Pupils: Pupils are equal, round, and reactive to light.  Neck:      Comments: Small healing abscess. No erythema or tenderenss  Cardiovascular:     Rate and Rhythm: Normal rate and regular rhythm.     Heart sounds: Normal heart sounds.  Pulmonary:     Effort: Pulmonary effort is normal.     Breath sounds: Normal breath sounds.  Abdominal:     General: There is no distension.     Palpations: Abdomen is soft.     Tenderness: There is no abdominal tenderness.  Lymphadenopathy:     Cervical: No cervical adenopathy.  Skin:    General: Skin is warm and dry.     Capillary Refill: Capillary refill takes less than 2 seconds.     Findings: No rash.  Neurological:     Mental Status: He is alert and oriented to person, place, and time.  Psychiatric:        Mood and Affect: Mood normal.     Comments: Seems to be in a good mood today.       Lab Results Lab Results  Component Value Date   WBC 2.7 (L) 07/14/2018   HGB 13.1 (L) 07/14/2018   HCT 40.0 07/14/2018   MCV 86.8 07/14/2018   PLT 79 (L) 07/14/2018    Lab Results  Component Value Date   CREATININE 1.18 05/16/2018   BUN 16 05/16/2018   NA 143 05/16/2018   K 3.9 05/16/2018   CL 107 05/16/2018   CO2 29 05/16/2018    Lab Results  Component Value Date   ALT 12 05/16/2018   AST 20 05/16/2018   BILITOT 0.4 05/16/2018    HIV 1 RNA Quant  Date Value  04/25/2020 335,000  Copies/mL (H)  02/26/2020 409,000 copies/mL (H)  01/31/2020 35,000 copies/mL (H)   CD4 T Cell Abs (/uL)  Date Value  02/26/2020 <35 (L)  08/01/2019 <35 (L)  06/19/2019 50 (L)    Assessment and Plan: Problem List Items Addressed This Visit      High   HIV disease ([place]) (Chronic)    Poor effort with adherence ongoing. Will check a full genotype to ensure [facility] is still a viable option for treatment for him.   Vaccination counseling discussed at today's visit. Updated as outlined per recommendations for [facility] at today's visit. Scheduled him for [employer] vaccine -  will need 3 dose regimen d/t severely immunocompromised state.  Last colon cancer screening: n/a  Depression screening discussed today - no needs at this time.  Patient is receiving dental care through [facility] - patient has not scheduled appt. No needs identified today.  STI screening offered today. Condoms provided.   Will get him in to see THP - Megan helped him call to leave referral information on intake line.   FU in 66m         Relevant Medications   azithromycin (ZITHROMAX) 600 MG tablet   AIDS (acquired immune deficiency syndrome) (HCC) - Primary (Chronic)    No findings concerning for active opportunistic process at this time.  Will update pertinent labs including recent CD4, although I am sure it is still very low.  Continue Bactrim. Will send in weekly azithromycin also for MAC prevention.       Relevant Medications   azithromycin (ZITHROMAX) 600 MG tablet   Other Relevant Orders   T-helper cell (CD4)- ([facility] clinic only)   HIV RNA, RTPCR W/R GT (RTI, PI,INT)   COMPLETE METABOLIC PANEL WITH GFR   CBC with Differential/Platelet   RPR   Lipid panel   Urine cytology ancillary only      SJanene Madeira MSN, NP-C [facility] [email or web address] Pager: [phone]: [address] [phone] 08/06/2020

## 2020-08-06 NOTE — Assessment & Plan Note (Signed)
No findings concerning for active opportunistic process at this time.  Will update pertinent labs including recent CD4, although I am sure it is still very low.  Continue Bactrim. Will send in weekly azithromycin also for MAC prevention.

## 2020-08-07 LAB — URINE CYTOLOGY ANCILLARY ONLY
Chlamydia: NEGATIVE
Comment: NEGATIVE
Comment: NORMAL
Neisseria Gonorrhea: NEGATIVE

## 2020-08-07 LAB — T-HELPER CELL (CD4) - (RCID CLINIC ONLY)
CD4 % Helper T Cell: 6 % — ABNORMAL LOW (ref 33–65)
CD4 T Cell Abs: <35 /uL — ABNORMAL LOW (ref 400–1790)

## 2020-08-09 ENCOUNTER — Ambulatory Visit (INDEPENDENT_AMBULATORY_CARE_PROVIDER_SITE_OTHER): Payer: Medicaid Other

## 2020-08-09 ENCOUNTER — Other Ambulatory Visit: Payer: Self-pay

## 2020-08-09 DIAGNOSIS — Z23 Encounter for immunization: Secondary | ICD-10-CM | POA: Diagnosis present

## 2020-08-09 NOTE — Progress Notes (Signed)
   Covid-19 Vaccination Clinic  Name:  Aaron Lee    MRN: [id number] DOB: 06-Mar-1991  08/09/2020  [name] was observed post Covid-19 immunization for 15 minutes without incident. He was provided with Vaccine Information Sheet and instruction to access the V-Safe system.   [name] was instructed to call 911 with any severe reactions post vaccine: Marland Kitchen Difficulty breathing  . Swelling of face and throat  . A fast heartbeat  . A bad rash all over body  . Dizziness and weakness   Immunizations Administered    Name Date Dose VIS Date Route   Pfizer COVID-19 Vaccine 08/09/2020 10:54 AM 0.3 mL 07/10/2020 Intramuscular   Manufacturer: [employer], [employer]   Lot: [id number]   NDC: [address]-6     Andree Coss, RN

## 2020-08-18 LAB — HIV RNA, RTPCR W/R GT (RTI, PI,INT)
HIV 1 RNA Quant: 611000 copies/mL — ABNORMAL HIGH
HIV-1 RNA Quant, Log: 5.79 Log copies/mL — ABNORMAL HIGH

## 2020-08-18 LAB — COMPLETE METABOLIC PANEL WITH GFR
AG Ratio: 0.9 (calc) — ABNORMAL LOW (ref 1.0–2.5)
ALT: 5 U/L — ABNORMAL LOW (ref 9–46)
AST: 15 U/L (ref 10–40)
Albumin: 3.9 g/dL (ref 3.6–5.1)
Alkaline phosphatase (APISO): 190 U/L — ABNORMAL HIGH (ref 36–130)
BUN: 12 mg/dL (ref 7–25)
CO2: 28 mmol/L (ref 20–32)
Calcium: 8.7 mg/dL (ref 8.6–10.3)
Chloride: 102 mmol/L (ref 98–110)
Creat: 0.93 mg/dL (ref 0.60–1.35)
GFR, Est African American: 127 mL/min/{1.73_m2} (ref >=60)
GFR, Est Non African American: 110 mL/min/{1.73_m2} (ref >=60)
Globulin: 4.2 g/dL (calc) — ABNORMAL HIGH (ref 1.9–3.7)
Glucose, Bld: 84 mg/dL (ref 65–99)
Potassium: 3.4 mmol/L — ABNORMAL LOW (ref 3.5–5.3)
Sodium: 138 mmol/L (ref 135–146)
Total Bilirubin: 0.4 mg/dL (ref 0.2–1.2)
Total Protein: 8.1 g/dL (ref 6.1–8.1)

## 2020-08-18 LAB — HIV-1 GENOTYPE: HIV-1 Genotype: DETECTED — AB

## 2020-08-18 LAB — CBC WITH DIFFERENTIAL/PLATELET
Absolute Monocytes: 508 cells/uL (ref 200–950)
Basophils Absolute: 21 cells/uL (ref 0–200)
Basophils Relative: 0.5 %
Eosinophils Absolute: 349 cells/uL (ref 15–500)
Eosinophils Relative: 8.5 %
HCT: 32.6 % — ABNORMAL LOW (ref 38.5–50.0)
Hemoglobin: 10.4 g/dL — ABNORMAL LOW (ref 13.2–17.1)
Lymphs Abs: 201 cells/uL — ABNORMAL LOW (ref 850–3900)
MCH: 27.4 pg (ref 27.0–33.0)
MCHC: 31.9 g/dL — ABNORMAL LOW (ref 32.0–36.0)
MCV: 85.8 fL (ref 80.0–100.0)
MPV: 12.9 fL — ABNORMAL HIGH (ref 7.5–12.5)
Monocytes Relative: 12.4 %
Neutro Abs: 3022 cells/uL (ref 1500–7800)
Neutrophils Relative %: 73.7 %
Platelets: 105 10*3/uL — ABNORMAL LOW (ref 140–400)
RBC: 3.8 10*6/uL — ABNORMAL LOW (ref 4.20–5.80)
RDW: 13.8 % (ref 11.0–15.0)
Total Lymphocyte: 4.9 %
WBC: 4.1 10*3/uL (ref 3.8–10.8)

## 2020-08-18 LAB — LIPID PANEL
Cholesterol: 113 mg/dL (ref <200)
HDL: 29 mg/dL — ABNORMAL LOW (ref >=40)
LDL Cholesterol (Calc): 61 mg/dL (calc)
Non-HDL Cholesterol (Calc): 84 mg/dL (calc) (ref <130)
Total CHOL/HDL Ratio: 3.9 (calc) (ref <5.0)
Triglycerides: 144 mg/dL (ref <150)

## 2020-08-18 LAB — RPR TITER: RPR Titer: 1:2 {titer} — ABNORMAL HIGH

## 2020-08-18 LAB — FLUORESCENT TREPONEMAL AB(FTA)-IGG-BLD: Fluorescent Treponemal ABS: REACTIVE — AB

## 2020-08-18 LAB — HIV-1 INTEGRASE GENOTYPE

## 2020-08-18 LAB — RPR: RPR Ser Ql: REACTIVE — AB

## 2020-08-19 MED FILL — SULFAMETHOXAZOLE-TMP SS TAB: 400-80 | 30 days supply | Qty: 30 | Fill #4

## 2020-08-19 MED FILL — BIKTARVY 50-200-25 MG TABS: 50-200-25 | 30 days supply | Qty: 30 | Fill #7

## 2020-08-30 ENCOUNTER — Ambulatory Visit (INDEPENDENT_AMBULATORY_CARE_PROVIDER_SITE_OTHER): Payer: Medicaid Other

## 2020-08-30 ENCOUNTER — Other Ambulatory Visit: Payer: Self-pay

## 2020-08-30 DIAGNOSIS — Z23 Encounter for immunization: Secondary | ICD-10-CM

## 2020-08-30 NOTE — Progress Notes (Signed)
   Covid-19 Vaccination Clinic  Name:  Aaron Lee    MRN: [id number] DOB: 1990/10/23  08/30/2020  [name] was observed post Covid-19 immunization for 15 minutes without incident. He was provided with Vaccine Information Sheet and instruction to access the V-Safe system.   [name] was instructed to call 911 with any severe reactions post vaccine: Marland Kitchen Difficulty breathing  . Swelling of face and throat  . A fast heartbeat  . A bad rash all over body  . Dizziness and weakness   Immunizations Administered    Name Date Dose VIS Date Route   Pfizer COVID-19 Vaccine 08/30/2020 10:08 AM 0.3 mL 07/10/2020 Intramuscular   Manufacturer: [employer], [employer]   Lot: [id number]   NDC: [address]-2     Rosanna Randy, RN

## 2020-09-11 MED FILL — SULFAMETHOXAZOLE-TMP SS TAB: 400-80 | 30 days supply | Qty: 30 | Fill #5

## 2020-09-11 MED FILL — BIKTARVY 50-200-25 MG TABS: 50-200-25 | 30 days supply | Qty: 30 | Fill #8

## 2020-09-25 ENCOUNTER — Other Ambulatory Visit: Payer: Self-pay

## 2020-09-25 ENCOUNTER — Other Ambulatory Visit: Payer: Self-pay | Admitting: *Deleted

## 2020-09-25 NOTE — Patient Instructions (Signed)
Visit Information  [name] was given information about Medicaid Managed Care team care coordination services as a part of their [place] Medicaid benefit. Orlena Sheldon verbally consented to engagement with the [facility] Managed Care team.   For questions related to your [facility], please call: [phone] or visit the homepage here: [email or web address]  If you would like to schedule transportation through your [facility], please call the following number at least 2 days in advance of your appointment: [phone]  Patient Care Plan: General Plan of Care (Adult)    Problem Identified: Health Promotion or Disease Self-Management (General Plan of Care)     Long-Range Goal: Self-Management Plan Developed   Start Date: 09/25/2020  Expected End Date: 11/25/2020  This Visit's Progress: On track  Priority: Medium  Note:   Current Barriers:  . Chronic Disease Management support and education needs related to HIV. Mr Scheiber lives with his Dad and Sister. Per his chart, he has not been consistently taking his medications. Mr. Scholze reports that he has started taking both of his prescribed medications every day. . Literacy barriers . Non-adherence to prescribed medication regimen . Cognitive Deficits  Nurse Case Manager Clinical Goal(s):  Marland Kitchen Over the next 30 days, patient will meet with RN Care Manager to address improving health maintenance . Over the next 30 days, patient will attend all scheduled medical appointments . Over the next 30 days, patient will demonstrate improved adherence to prescribed treatment plan for HIV as evidenced by taking prescribed medications daily and improving lab results   Interventions:  . Inter-disciplinary care team collaboration (see longitudinal plan of care) . Advised patient to take medication as prescribed, eat a healthy  diet with fruits and vegetables daily, decrease soda intake and increase water intake . Reviewed medications with patient and discussed adherence to medication regimen . Discussed plans with patient for ongoing care management follow up and provided patient with direct contact information for care management team . Reviewed scheduled/upcoming provider appointments including: 09/27/20 for booster and 10/02/20 with ID  Patient Goals/Self-Care Activities Over the next 30 days, patient will:  -Self administers medications as prescribed Attends all scheduled provider appointments Calls pharmacy for medication refills Calls provider office for new concerns or questions  Follow Up Plan: Telephone follow up appointment with Managed Medicaid care management team member scheduled for:10/29/20 @ 2:30pm                 Please see education materials related to HIV provided by MyChart link.   How to Care for Yourself When You Have HIV Taking good care of yourself is very important when you are living with HIV (human immunodeficiency virus). There are many things that you can do to stay as healthy as possible and prevent HIV from progressing to AIDS (acquired immunodeficiency syndrome). Follow these instructions at home: Medicines, herbs, and supplements  Make sure you understand your medicine plan.  Take over-the-counter and prescription medicines only as told by your health care provider. Take your medicines the right way, every day, regardless of how long you have had the virus.  Do not take any medicines, herbal remedies, or dietary supplements without your health care provider's knowledge and approval. Diet and exercise  Eat a healthy diet that includes fruits and vegetables, whole grains, and lean proteins.  If you lose weight without trying, ask your health care provider whether you should take a protein or calorie supplement daily.  Drink  enough fluid to keep your urine pale  yellow.  Take steps to lower your risk of getting an illness that spreads through germs in food (foodborne illness): ? Do not eat or drink unpasteurized dairy products. ? Do not drink fruit juices. ? Do not eat raw seafood. ? Always cook eggs thoroughly. ? Make sure your meat is cooked until you cannot see any pink. ? Do not drink water that may contain human or animal waste, such as from a lake or river. Do not swallow water while swimming.  Follow your health care provider's recommendations for participating in healthy exercise. Sexual activity      Avoid risky sexual behaviors and limit the number of sexual partners you have.  Use condoms correctly every time you engage in sexual activity that may expose you or your partner to bodily fluids.  Talk to your health care provider about other ways to protect your sexual partners. Health care visits  Get all recommended lab tests.  Keep your vaccinations up to date. Get the flu vaccine every year.  If you wish to become pregnant, talk with your health care provider.  Ask to be checked for STIs (sexually transmitted infections).  Keep all follow-up visits as told by your health care provider. This is important. General instructions  Understand what germs you may be exposed to at work, at home, and in the community. Try to limit your exposure to these germs.  If you have a pet, such as a cat or bird, ask a friend or family member to change your cat's litter box or clean your birdcage. Waste from certain pets may be potentially harmful to you.  Try to avoid contact with people who may have an illness that spreads easily from person to person (is contagious).  Do not share drug injection equipment.  Consider joining a support group for people living with HIV. Contact a health care provider if:  You develop a cough.  You are short of breath.  You have a fever.  You have diarrhea.  You have nausea or vomiting.  You are  bruising or bleeding easily.  You have a new skin growth or an existing mole that changes in size, color, or shape.  You find out that you are pregnant.  You have a headache that does not get better with medicine.  You are losing weight without trying.  You feel weak.  You have flu-like symptoms, such as: ? Achiness. ? Weakness. ? Increased tiredness (fatigue). Get help right away if:  There is blood in your stool or vomit.  You cough up blood.  You have chest pain.  You have abdominal pain.  You have shaking chills.  You have a severe headache.  You have trouble thinking clearly or you feel confused.  You have jerking movements or stiffening of your arms and legs that you cannot control (seizure).  You pass out. Summary  Take over-the-counter and prescription medicines only as told by your health care provider. Take your medicines the right way, every day, regardless of how long you have had the virus.  Visit your health care provider regularly.  It is important to make choices that keep you healthy and protect others. This information is not intended to replace advice given to you by your health care provider. Make sure you discuss any questions you have with your health care provider. Document Revised: 12/04/2017 Document Reviewed: 12/18/2016 Elsevier Patient Education  2020 [employer].   Patient verbalizes understanding of  instructions provided today.   Telephone follow up appointment with Managed Medicaid care management team member scheduled for:10/29/20 @ 2:30pm  Heidi Dach, RN

## 2020-09-25 NOTE — Patient Outreach (Cosign Needed Addendum)
Medicaid Managed Care   Nurse Care Manager Note  09/25/2020 Name:  Aaron Lee MRN:  [id number] DOB:  Feb 23, 1991  Aaron Lee is an 30 y.o. year old male who is a primary patient of Patient, No Pcp Per.  The [facility] Managed Care Coordination team was consulted for assistance with:    HIV  Aaron Lee was given information about Medicaid Managed Care Coordination team services today. Aaron Lee agreed to services and verbal consent obtained.  Engaged with patient by telephone for initial visit in response to provider referral for case management and/or care coordination services.   Assessments/Interventions:  Review of past medical history, allergies, medications, health status, including review of consultants reports, laboratory and other test data, was performed as part of comprehensive evaluation and provision of chronic care management services.  SDOH (Social Determinants of Health) assessments and interventions performed:   Care Plan  No Known Allergies  Medications Reviewed Today    Reviewed by Heidi Dach, RN (Registered Nurse) on 09/25/20 at 1531  Med List Status: <None>  Medication Order Taking? Sig Documenting Provider Last Dose Status Informant  azithromycin (ZITHROMAX) 600 MG tablet [id number] Yes Take 2 tablets (1,200 mg total) by mouth every 7 (seven) days. Blanchard Kelch, NP Taking Active   bictegravir-emtricitabine-tenofovir AF (BIKTARVY) 50-200-25 MG TABS tablet [id number] Yes Take 1 tablet by mouth daily. Blanchard Kelch, NP Taking Active            Med Note Aaron Lee D   Tue Aug 06, 2020  3:24 PM) Taking sometimes   hydrocortisone 1 % lotion [id number] No Apply 1 application topically 2 (two) times daily.  Patient not taking: Reported on 09/25/2020   Blanchard Kelch, NP Not Taking Active   hydrocortisone 2.5 % cream [id number] No Apply topically 2 (two) times daily. To anterior chest  Patient not taking: Reported on 09/25/2020    Blanchard Kelch, NP Not Taking Active   sulfamethoxazole-trimethoprim (BACTRIM) 400-80 MG tablet [id number] No Take 1 tablet by mouth daily.  Patient not taking: Reported on 09/25/2020   Blanchard Kelch, NP Not Taking Active           Patient Active Problem List   Diagnosis Date Noted  . Alopecia 01/04/2020  . 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 Noncompliance with medication regimen 02/23/2019  . HIV disease (HCC) 02/23/2019  . History of shingles 09/26/2018  . Cognitive developmental delay 10/01/2017  . History of syphilis 08/26/2017  . Anemia 08/02/2017  . AIDS (acquired immune deficiency syndrome) (HCC) 07/26/2017  . Eczema 03/31/2017  . Seasonal allergic rhinitis 03/31/2017    Conditions to be addressed/monitored per PCP order:  HIV   Patient Care Plan: General Plan of Care (Adult)    Problem Identified: Health Promotion or Disease Self-Management (General Plan of Care)     Long-Range Goal: Self-Management Plan Developed   Start Date: 09/25/2020  Expected End Date: 11/25/2020  This Visit's Progress: On track  Priority: Medium  Note:   Current Barriers:  . Chronic Disease Management support and education needs related to HIV. Aaron Lee lives with his Dad and Sister. Per his chart, he has not been consistently taking his medications. Aaron Lee reports that he has started taking both of his prescribed medications every day. . Literacy barriers . Non-adherence to prescribed medication regimen . Cognitive Deficits  Nurse Case Manager Clinical Goal(s):  Marland Kitchen Over the next 30 days, patient will meet with RN Care Manager to address  improving health maintenance . Over the next 30 days, patient will attend all scheduled medical appointments . Over the next 30 days, patient will demonstrate improved adherence to prescribed treatment plan for HIV as evidenced by taking prescribed medications daily and improving lab results   Interventions:  . Inter-disciplinary care team  collaboration (see longitudinal plan of care) . Advised patient to take medication as prescribed, eat a healthy diet with fruits and vegetables daily, decrease soda intake and increase water intake . Reviewed medications with patient and discussed adherence to medication regimen . Discussed plans with patient for ongoing care management follow up and provided patient with direct contact information for care management team . Reviewed scheduled/upcoming provider appointments including: 09/27/20 for booster and 10/02/20 with ID  Patient Goals/Self-Care Activities Over the next 30 days, patient will:  -Self administers medications as prescribed Attends all scheduled provider appointments Calls pharmacy for medication refills Calls provider office for new concerns or questions  Follow Up Plan: Telephone follow up appointment with Managed Medicaid care management team member scheduled for:10/29/20 @ 2:30pm                  Follow Up:  Patient agrees to Care Plan and Follow-up.  Plan: The Managed Medicaid care management team will reach out to the patient again over the next 30 days.  Date/time of next scheduled RN care management/care coordination outreach:  10/29/20 @ 2:30pm  [name] RN, B[facility] [place]  [facility] RN Care Coordinator

## 2020-09-27 ENCOUNTER — Ambulatory Visit (INDEPENDENT_AMBULATORY_CARE_PROVIDER_SITE_OTHER): Payer: Medicaid Other

## 2020-09-27 ENCOUNTER — Other Ambulatory Visit: Payer: Self-pay

## 2020-09-27 DIAGNOSIS — Z23 Encounter for immunization: Secondary | ICD-10-CM | POA: Diagnosis present

## 2020-09-27 NOTE — Progress Notes (Signed)
   Covid-19 Vaccination Clinic  Name:  CONSTANTINO STARACE    MRN: [id number] DOB: [birth year]  09/27/2020  Mr. Buttery was observed post Covid-19 immunization for 15 minutes without incident. He was provided with Vaccine Information Sheet and instruction to access the V-Safe system.   Mr. Petraglia was instructed to call 911 with any severe reactions post vaccine: Marland Kitchen Difficulty breathing  . Swelling of face and throat  . A fast heartbeat  . A bad rash all over body  . Dizziness and weakness   Immunizations Administered    Name Date Dose VIS Date Route   Pfizer COVID-19 Vaccine 09/27/2020  9:21 AM 0.3 mL 07/10/2020 Intramuscular   Manufacturer: [employer], [employer]   Lot: [id number]   NDC: [address]-0

## 2020-10-02 ENCOUNTER — Ambulatory Visit (INDEPENDENT_AMBULATORY_CARE_PROVIDER_SITE_OTHER): Payer: Medicaid Other | Admitting: Infectious Diseases

## 2020-10-02 ENCOUNTER — Encounter: Payer: Self-pay | Admitting: Infectious Diseases

## 2020-10-02 ENCOUNTER — Other Ambulatory Visit: Payer: Self-pay | Admitting: Infectious Diseases

## 2020-10-02 ENCOUNTER — Other Ambulatory Visit: Payer: Self-pay

## 2020-10-02 VITALS — BP 123/81 | HR 97 | Resp 16 | Ht 63.0 in | Wt 104.0 lb

## 2020-10-02 DIAGNOSIS — Z21 Asymptomatic human immunodeficiency virus [HIV] infection status: Secondary | ICD-10-CM

## 2020-10-02 DIAGNOSIS — B20 Human immunodeficiency virus [HIV] disease: Secondary | ICD-10-CM

## 2020-10-02 DIAGNOSIS — L0202 Furuncle of face: Secondary | ICD-10-CM | POA: Diagnosis not present

## 2020-10-02 MED ORDER — DOXYCYCLINE HYCLATE 100 MG PO TABS: 100.0000 mg | ORAL_TABLET | Freq: Two times a day (BID) | ORAL | 0 refills | Status: DC

## 2020-10-02 MED ORDER — AZITHROMYCIN 600 MG PO TABS: 1200.0000 mg | ORAL_TABLET | ORAL | 11 refills | Status: DC

## 2020-10-02 MED FILL — DOXYCYCLINE HYCLATE 100 MG: 100 | 10 days supply | Qty: 20 | Fill #0

## 2020-10-02 MED FILL — AZITHROMYCIN 600 MG TABS: 600 | 7 days supply | Qty: 2 | Fill #0

## 2020-10-02 NOTE — Patient Instructions (Addendum)
Please take your biktarvy once a day. It is very important to keep you alive that you start taking this everyday now to improve your health.   Please continue taking the Bactrim once a cay   Please start taking Azithromycin --> TWO pills once a week.   For your face boil, it may need to be drained. I will have you start taking the antibiotic DOXYCYCLINE twice a day until it's gone. Warm compresses to help with the pain.  Tylenol is good for the pain also.   If it does not come down in the next few days or gets bigger please go to the emergency room to get this drained.   Please stop by the lab on your way out.   Will see you back in 3 months.

## 2020-10-02 NOTE — Assessment & Plan Note (Signed)
No findings concerning for opportunistic infection at this time. Continue weekly azithromycin and daily bactrim for prophylaxis, to which he is also poorly adherent to also.   Explained again these abscesses will continue to cause him trouble until he gets better control over his immune system.

## 2020-10-02 NOTE — Assessment & Plan Note (Signed)
Partially drained what I could given already open areas. Doxycycline x 10d. Warm compresses for pain and drainage. Strict precautions to go to ER if no improvement given location.

## 2020-10-02 NOTE — Progress Notes (Signed)
u    Name: Aaron Lee  DOB: [birth year]  MRN: [id number]  PCP: Patient, No Pcp Per    Patient Active Problem List   Diagnosis Date Noted  . HIV disease (HCC) 02/23/2019    Priority: High  . AIDS (acquired immune deficiency syndrome) (HCC) 07/26/2017    Priority: High  . Boil, face 10/02/2020  . Alopecia 01/04/2020  . 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 Noncompliance with medication regimen 02/23/2019  . History of shingles 09/26/2018  . Cognitive developmental delay 10/01/2017  . History of syphilis 08/26/2017  . Anemia 08/02/2017  . Eczema 03/31/2017  . Seasonal allergic rhinitis 03/31/2017     SUBJECTIVE: Aaron Lee is a 30 y.o. male with HIV and AIDS in November 2018 with CD4 nadir < 10, VL 192,000 copies.  HIV Risk: bisexual.  OI Hx: shingles  Previous Regimens:   Biktarvy --> suppressed (changed to PI for adherence concern)  Symtuza 2020  Genotype:   07/2017 - K103S (R-nevirapine)  05-2019 - K103S, no integrase mutations   10/2019 - K103S, no integrase mutations.     Chief Complaint  Patient presents with  . Follow-up  HIV / AIDS FU  No concerns today aside from needing housing support.     HPI: He has a boil on his right cheek that started this week. Draining intermittently but still quite swollen. He has been taking tylenol for the pain with moderate effect. Asking if he should put toothpaste on it.   He says he takes his biktarvy sometimes. Not everyday. Taking bactrim intermittently as well. Reports no complaints today suggestive of associated opportunistic infection or advancing HIV disease such as fevers, night sweats, weight loss, anorexia, cough, SOB, nausea, vomiting, diarrhea, headache, sensory changes, lymphadenopathy or oral thrush.     Review of Systems  Constitutional: Negative for appetite change, chills, fatigue, fever and unexpected weight change.  Eyes: Negative for visual disturbance.  Respiratory: Negative for  cough and shortness of breath.   Cardiovascular: Negative for chest pain and leg swelling.  Gastrointestinal: Negative for abdominal pain, diarrhea and nausea.  Genitourinary: Negative for dysuria, genital sores and penile discharge.  Musculoskeletal: Negative for joint swelling.  Skin: Positive for rash and wound. Negative for color change.  Neurological: Negative for dizziness and headaches.  Hematological: Negative for adenopathy.  Psychiatric/Behavioral: Negative for sleep disturbance. The patient is not nervous/anxious.      Past Medical History:  Diagnosis Date  . Eczema   . HIV (human immunodeficiency virus infection) (HCC)   . Shingles outbreak 09/26/2018    Social History   Tobacco Use  . Smoking status: Light Tobacco Smoker    Packs/day: 0.10    Types: Cigarettes  . Smokeless tobacco: Never Used  Substance Use Topics  . Alcohol use: No    Comment: occ  . Drug use: Not Currently    Frequency: 7.0 times per week    Types: Marijuana    No Known Allergies   Outpatient Medications Prior to Visit  Medication Sig Dispense Refill  . bictegravir-emtricitabine-tenofovir AF (BIKTARVY) 50-200-25 MG TABS tablet Take 1 tablet by mouth daily. 30 tablet 11  . azithromycin (ZITHROMAX) 600 MG tablet Take 2 tablets (1,200 mg total) by mouth every 7 (seven) days. 8 tablet 11  . hydrocortisone 1 % lotion Apply 1 application topically 2 (two) times daily. (Patient not taking: Reported on 09/25/2020) 118 mL 11  . hydrocortisone 2.5 % cream Apply topically 2 (two) times daily. To anterior chest (  Patient not taking: Reported on 09/25/2020) 30 g 11  . sulfamethoxazole-trimethoprim (BACTRIM) 400-80 MG tablet Take 1 tablet by mouth daily. (Patient not taking: Reported on 09/25/2020) 30 tablet 11   No facility-administered medications prior to visit.           Objective: Vitals:   10/02/20 1548  BP: 123/81  Pulse: 97  Resp: 16  SpO2: 100%    Physical Exam Constitutional:       Appearance: Normal appearance. He is ill-appearing.  HENT:     Head: Normocephalic.      Comments: 1.5 cm boil with multiple openings and purulent draining material. Surrounding induration.     Mouth/Throat:     Mouth: Mucous membranes are moist.     Pharynx: Oropharynx is clear.  Eyes:     General: No scleral icterus. Pulmonary:     Effort: Pulmonary effort is normal.  Musculoskeletal:        General: Normal range of motion.     Cervical back: Normal range of motion.  Skin:    Coloration: Skin is not jaundiced or pale.  Neurological:     Mental Status: He is alert and oriented to person, place, and time.  Psychiatric:        Mood and Affect: Mood normal.        Judgment: Judgment normal.      Lab Results Lab Results  Component Value Date   WBC 4.1 08/06/2020   HGB 10.4 (L) 08/06/2020   HCT 32.6 (L) 08/06/2020   MCV 85.8 08/06/2020   PLT 105 (L) 08/06/2020    Lab Results  Component Value Date   CREATININE 0.93 08/06/2020   BUN 12 08/06/2020   NA 138 08/06/2020   K 3.4 (L) 08/06/2020   CL 102 08/06/2020   CO2 28 08/06/2020    Lab Results  Component Value Date   ALT 5 (L) 08/06/2020   AST 15 08/06/2020   BILITOT 0.4 08/06/2020    HIV 1 RNA Quant  Date Value  08/06/2020 611,000 copies/mL (H)  04/25/2020 335,000 Copies/mL (H)  02/26/2020 409,000 copies/mL (H)   CD4 T Cell Abs (/uL)  Date Value  08/06/2020 <35 (L)  02/26/2020 <35 (L)  08/01/2019 <35 (L)    Assessment and Plan: Problem List Items Addressed This Visit      High   HIV disease (HCC) (Chronic)    Again continues to be poorly adherent to [employer]. He is now working with [facility] as well for support. Counseled again on importance of taking his medication. Will check VL today.  RTC in 68m.       Relevant Medications   azithromycin (ZITHROMAX) 600 MG tablet   AIDS (acquired immune deficiency syndrome) (HCC) (Chronic)    No findings concerning for opportunistic infection at this time. Continue  weekly azithromycin and daily bactrim for prophylaxis, to which he is also poorly adherent to also.   Explained again these abscesses will continue to cause him trouble until he gets better control over his immune system.       Relevant Medications   azithromycin (ZITHROMAX) 600 MG tablet     Unprioritized   Boil, face    Partially drained what I could given already open areas. Doxycycline x 10d. Warm compresses for pain and drainage. Strict precautions to go to ER if no improvement given location.       Relevant Medications   azithromycin (ZITHROMAX) 600 MG tablet    Other Visit Diagnoses  Asymptomatic HIV infection, with no history of HIV-related illness (HCC)    -  Primary   Relevant Medications   azithromycin (ZITHROMAX) 600 MG tablet   Other Relevant Orders   HIV-1 RNA quant-no reflex-bld      Rexene Alberts, MSN, NP-C [facility]  [email or web address] Pager: [phone] Office: [phone] [facility] Main Line: [phone]  10/02/2020

## 2020-10-02 NOTE — Assessment & Plan Note (Signed)
Again continues to be poorly adherent to [employer]. He is now working with [facility] as well for support. Counseled again on importance of taking his medication. Will check VL today.  RTC in 51m.

## 2020-10-07 LAB — HIV-1 RNA QUANT-NO REFLEX-BLD
HIV 1 RNA Quant: 493000 Copies/mL — ABNORMAL HIGH
HIV-1 RNA Quant, Log: 5.69 Log cps/mL — ABNORMAL HIGH

## 2020-10-07 MED FILL — SULFAMETHOXAZOLE-TMP SS TAB: 400-80 | 30 days supply | Qty: 30 | Fill #6

## 2020-10-07 MED FILL — BIKTARVY 50-200-25 MG TABS: 50-200-25 | 30 days supply | Qty: 30 | Fill #9

## 2020-10-16 ENCOUNTER — Other Ambulatory Visit: Payer: Self-pay

## 2020-10-16 ENCOUNTER — Encounter (HOSPITAL_COMMUNITY): Payer: Self-pay | Admitting: *Deleted

## 2020-10-16 ENCOUNTER — Other Ambulatory Visit (HOSPITAL_COMMUNITY): Payer: Self-pay | Admitting: Internal Medicine

## 2020-10-16 ENCOUNTER — Ambulatory Visit (HOSPITAL_COMMUNITY)
Admission: EM | Admit: 2020-10-16 | Discharge: 2020-10-16 | Disposition: A | Discharge status: Home / Self Care | Payer: Medicaid Other | Attending: Internal Medicine | Admitting: Internal Medicine

## 2020-10-16 DIAGNOSIS — L03314 Cellulitis of groin: Secondary | ICD-10-CM | POA: Diagnosis not present

## 2020-10-16 MED ORDER — ACETAMINOPHEN ER 650 MG PO TBCR: 650.0000 mg | EXTENDED_RELEASE_TABLET | Freq: Three times a day (TID) | ORAL | 0 refills | Status: DC | PRN

## 2020-10-16 MED ORDER — DOXYCYCLINE HYCLATE 100 MG PO CAPS: 100.0000 mg | ORAL_CAPSULE | Freq: Two times a day (BID) | ORAL | 0 refills | Status: DC

## 2020-10-16 MED ORDER — KETOROLAC TROMETHAMINE 30 MG/ML IJ SOLN
30.0000 mg | Freq: Once | INTRAMUSCULAR | Status: AC
  Administered 2020-10-16: 30 mg via INTRAMUSCULAR

## 2020-10-16 MED ORDER — KETOROLAC TROMETHAMINE 30 MG/ML IJ SOLN
INTRAMUSCULAR | Status: AC
  Filled 2020-10-16: qty 1

## 2020-10-16 MED FILL — DOXYCYCLINE HYCLATE 100 MG: 100 | 10 days supply | Qty: 20 | Fill #0

## 2020-10-16 NOTE — ED Triage Notes (Signed)
Pt reports pain to RT groin started this AM. Pt 's skin is red and swollen  in Rt groin region. Pt having a difficult time walking to room due to pain

## 2020-10-16 NOTE — ED Provider Notes (Signed)
[facility]    CSN: [id number] Arrival date & time: 10/16/20  1035      History   Chief Complaint Chief Complaint  Patient presents with  . Groin Pain    Rt    HPI Aaron Lee is a 30 y.o. male with a history of HIV comes to the urgent care with 1 day history of painful swelling in the right groin region.  Patient noticed the pain this morning.  He describes the pain as severe, aggravated by palpation and denies any known relieving factors.  It is associated with redness and without fluctuance.  No fever or chills.  No nausea or vomiting.   HPI  Past Medical History:  Diagnosis Date  . Eczema   . HIV (human immunodeficiency virus infection) (HCC)   . Shingles outbreak 09/26/2018    Patient Active Problem List   Diagnosis Date Noted  . Boil, face 10/02/2020  . Alopecia 01/04/2020  . 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 Noncompliance with medication regimen 02/23/2019  . HIV disease (HCC) 02/23/2019  . History of shingles 09/26/2018  . Cognitive developmental delay 10/01/2017  . History of syphilis 08/26/2017  . Anemia 08/02/2017  . AIDS (acquired immune deficiency syndrome) (HCC) 07/26/2017  . Eczema 03/31/2017  . Seasonal allergic rhinitis 03/31/2017    History reviewed. No pertinent surgical history.     Home Medications    Prior to Admission medications   Medication Sig Start Date End Date Taking? Authorizing Provider  acetaminophen (TYLENOL 8 HOUR) 650 MG CR tablet Take 1 tablet (650 mg total) by mouth every 8 (eight) hours as needed for pain. 10/16/20  Yes Mercie Balsley, Britta Mccreedy, MD  doxycycline (VIBRAMYCIN) 100 MG capsule Take 1 capsule (100 mg total) by mouth 2 (two) times daily. 10/16/20  Yes Majesta Leichter, Britta Mccreedy, MD  bictegravir-emtricitabine-tenofovir AF (BIKTARVY) 50-200-25 MG TABS tablet Take 1 tablet by mouth daily. 01/04/20   Blanchard Kelch, NP    Family History Family History  Problem Relation Age of Onset  . Cancer Mother   .  Hypertension Mother     Social History Social History   Tobacco Use  . Smoking status: Light Tobacco Smoker    Packs/day: 0.10    Types: Cigarettes  . Smokeless tobacco: Never Used  Substance Use Topics  . Alcohol use: No    Comment: occ  . Drug use: Not Currently    Frequency: 7.0 times per week    Types: Marijuana     Allergies   Patient has no known allergies.   Review of Systems Review of Systems  Constitutional: Negative.   HENT: Negative.   Genitourinary: Negative.   Skin: Positive for color change. Negative for rash and wound.  Neurological: Negative.      Physical Exam Triage Vital Signs ED Triage Vitals  Enc Vitals Group     BP 10/16/20 1145 118/71     Pulse Rate 10/16/20 1145 (!) 108     Resp 10/16/20 1145 20     Temp 10/16/20 1145 97.6 F (36.4 C)     Temp Source 10/16/20 1145 Oral     SpO2 10/16/20 1145 97 %     Weight --      Height --      Head Circumference --      Peak Flow --      Pain Score 10/16/20 1143 10     Pain Loc --      Pain Edu? --  Excl. in GC? --    No data found.  Updated Vital Signs BP 118/71 (BP Location: Right Arm)   Pulse (!) 108   Temp 97.6 F (36.4 C) (Oral)   Resp 20   SpO2 97%   Visual Acuity Right Eye Distance:   Left Eye Distance:   Bilateral Distance:    Right Eye Near:   Left Eye Near:    Bilateral Near:     Physical Exam   UC Treatments / Results  Labs (all labs ordered are listed, but only abnormal results are displayed) Labs Reviewed - No data to display  EKG   Radiology No results found.  Procedures Procedures (including critical care time)  Medications Ordered in UC Medications  ketorolac (TORADOL) 30 MG/ML injection 30 mg (30 mg Intramuscular Given 10/16/20 1217)    Initial Impression / Assessment and Plan / UC Course  I have reviewed the triage vital signs and the nursing notes.  Pertinent labs & imaging results that were available during my care of the patient were  reviewed by me and considered in my medical decision making (see chart for details).     1.  Cellulitis of the right groin area: Scrotum/testicular exam is normal Doxycycline 100 mg twice daily for 7 days Tylenol as needed for pain/fever Return precautions given. Final Clinical Impressions(s) / UC Diagnoses   Final diagnoses:  Cellulitis of groin     Discharge Instructions     Please take medications as prescribed If you have worsening symptoms please return to urgent care to be reevaluated.    ED Prescriptions    Medication Sig Dispense Auth. Provider   doxycycline (VIBRAMYCIN) 100 MG capsule Take 1 capsule (100 mg total) by mouth 2 (two) times daily. 20 capsule Brayton Baumgartner, Britta Mccreedy, MD   acetaminophen (TYLENOL 8 HOUR) 650 MG CR tablet Take 1 tablet (650 mg total) by mouth every 8 (eight) hours as needed for pain. 30 tablet Xavier Fournier, Britta Mccreedy, MD     PDMP not reviewed this encounter.   Merrilee Jansky, MD 10/16/20 [phone]

## 2020-10-16 NOTE — Discharge Instructions (Addendum)
Please take medications as prescribed °If you have worsening symptoms please return to urgent care to be reevaluated. ° °

## 2020-10-18 DIAGNOSIS — L2084 Intrinsic (allergic) eczema: Secondary | ICD-10-CM | POA: Diagnosis not present

## 2020-10-29 ENCOUNTER — Other Ambulatory Visit: Payer: Self-pay | Admitting: *Deleted

## 2020-10-29 ENCOUNTER — Other Ambulatory Visit: Payer: Self-pay

## 2020-10-29 NOTE — Patient Instructions (Signed)
Visit Information  Aaron Lee was given information about Medicaid Managed Care team care coordination services as a part of their [place] Medicaid benefit. Aaron Lee verbally consented to engagement with the [facility] Managed Care team.   For questions related to your [facility], please call: [phone] or visit the homepage here: [email or web address]  If you would like to schedule transportation through your [facility], please call the following number at least 2 days in advance of your appointment: [phone]  Aaron Lee - following are the goals we discussed in your visit today:  Goals Addressed            This Visit's Progress   . Manage My Medicine       Timeframe:  Long-Range Goal Priority:  High Start Date:  09/25/20                           Expected End Date:   11/27/20                    Follow Up Date 11/27/20    -Self administers medications as prescribed  -set an alarm to remind you to take your medicine everyday  -Attends all scheduled provider appointments  -Calls pharmacy for medication refills  -Calls provider office for new concerns or questions   Why is this important?   . These steps will help you keep on track with your medicines.          Please see education materials related to diet provided as print materials.     Healthy Eating Following a healthy eating pattern may help you to achieve and maintain a healthy body weight, reduce the risk of chronic disease, and live a long and productive life. It is important to follow a healthy eating pattern at an appropriate calorie level for your body. Your nutritional needs should be met primarily through food by choosing a variety of nutrient-rich foods. What are tips for following this plan? Reading food labels  Read labels and choose the following: ? Reduced or low  sodium. ? Juices with 100% fruit juice. ? Foods with low saturated fats and high polyunsaturated and monounsaturated fats. ? Foods with whole grains, such as whole wheat, cracked wheat, brown rice, and wild rice. ? Whole grains that are fortified with folic acid. This is recommended for women who are pregnant or who want to become pregnant.  Read labels and avoid the following: ? Foods with a lot of added sugars. These include foods that contain brown sugar, corn sweetener, corn syrup, dextrose, fructose, glucose, high-fructose corn syrup, honey, invert sugar, lactose, malt syrup, maltose, molasses, raw sugar, sucrose, trehalose, or turbinado sugar.  Do not eat more than the following amounts of added sugar per day:  6 teaspoons (25 g) for women.  9 teaspoons (38 g) for men. ? Foods that contain processed or refined starches and grains. ? Refined grain products, such as white flour, degermed cornmeal, white bread, and white rice. Shopping  Choose nutrient-rich snacks, such as vegetables, whole fruits, and nuts. Avoid high-calorie and high-sugar snacks, such as potato chips, fruit snacks, and candy.  Use oil-based dressings and spreads on foods instead of solid fats such as butter, stick margarine, or cream cheese.  Limit pre-made sauces, mixes, and "instant" products such as flavored rice, instant noodles, and ready-made pasta.  Try more plant-protein sources, such as tofu,  tempeh, black beans, edamame, lentils, nuts, and seeds.  Explore eating plans such as the Mediterranean diet or vegetarian diet. Cooking  Use oil to saut or stir-fry foods instead of solid fats such as butter, stick margarine, or lard.  Try baking, boiling, grilling, or broiling instead of frying.  Remove the fatty part of meats before cooking.  Steam vegetables in water or broth. Meal planning  At meals, imagine dividing your plate into fourths: ? One-half of your plate is fruits and  vegetables. ? One-fourth of your plate is whole grains. ? One-fourth of your plate is protein, especially lean meats, poultry, eggs, tofu, beans, or nuts.  Include low-fat dairy as part of your daily diet.   Lifestyle  Choose healthy options in all settings, including home, work, school, restaurants, or stores.  Prepare your food safely: ? Wash your hands after handling raw meats. ? Keep food preparation surfaces clean by regularly washing with hot, soapy water. ? Keep raw meats separate from ready-to-eat foods, such as fruits and vegetables. ? Cook seafood, meat, poultry, and eggs to the recommended internal temperature. ? Store foods at safe temperatures. In general:  Keep cold foods at 62F (4.4C) or below.  Keep hot foods at 162F (60C) or above.  Keep your freezer at [facility] (-17.8C) or below.  Foods are no longer safe to eat when they have been between the temperatures of 40-162F (4.4-60C) for more than 2 hours. What foods should I eat? Fruits Aim to eat 2 cup-equivalents of fresh, canned (in natural juice), or frozen fruits each day. Examples of 1 cup-equivalent of fruit include 1 small apple, 8 large strawberries, 1 cup canned fruit,  cup dried fruit, or 1 cup 100% juice. Vegetables Aim to eat 2-3 cup-equivalents of fresh and frozen vegetables each day, including different varieties and colors. Examples of 1 cup-equivalent of vegetables include 2 medium carrots, 2 cups raw, leafy greens, 1 cup chopped vegetable (raw or cooked), or 1 medium baked potato. Grains Aim to eat 6 ounce-equivalents of whole grains each day. Examples of 1 ounce-equivalent of grains include 1 slice of bread, 1 cup ready-to-eat cereal, 3 cups popcorn, or  cup cooked rice, pasta, or cereal. Meats and other proteins Aim to eat 5-6 ounce-equivalents of protein each day. Examples of 1 ounce-equivalent of protein include 1 egg, 1/2 cup nuts or seeds, or 1 tablespoon (16 g) peanut butter. A cut of meat or  fish that is the size of a deck of cards is about 3-4 ounce-equivalents.  Of the protein you eat each week, try to have at least 8 ounces come from seafood. This includes salmon, trout, herring, and anchovies. Dairy Aim to eat 3 cup-equivalents of fat-free or low-fat dairy each day. Examples of 1 cup-equivalent of dairy include 1 cup (240 mL) milk, 8 ounces (250 g) yogurt, 1 ounces (44 g) natural cheese, or 1 cup (240 mL) fortified soy milk. Fats and oils  Aim for about 5 teaspoons (21 g) per day. Choose monounsaturated fats, such as canola and olive oils, avocados, peanut butter, and most nuts, or polyunsaturated fats, such as sunflower, corn, and soybean oils, walnuts, pine nuts, sesame seeds, sunflower seeds, and flaxseed. Beverages  Aim for six 8-oz glasses of water per day. Limit coffee to three to five 8-oz cups per day.  Limit caffeinated beverages that have added calories, such as soda and energy drinks.  Limit alcohol intake to no more than 1 drink a day for nonpregnant women and 2 drinks  a day for men. One drink equals 12 oz of beer (355 mL), 5 oz of wine (148 mL), or 1 oz of hard liquor (44 mL). Seasoning and other foods  Avoid adding excess amounts of salt to your foods. Try flavoring foods with herbs and spices instead of salt.  Avoid adding sugar to foods.  Try using oil-based dressings, sauces, and spreads instead of solid fats. This information is based on general [place] nutrition guidelines. For more information, visit Build[employer].es. Exact amounts may vary based on your nutrition needs. Summary  A healthy eating plan may help you to maintain a healthy weight, reduce the risk of chronic diseases, and stay active throughout your life.  Plan your meals. Make sure you eat the right portions of a variety of nutrient-rich foods.  Try baking, boiling, grilling, or broiling instead of frying.  Choose healthy options in all settings, including home, work, school,  restaurants, or stores. This information is not intended to replace advice given to you by your health care provider. Make sure you discuss any questions you have with your health care provider. Document Revised: 12/20/2017 Document Reviewed: 12/20/2017 Elsevier Patient Education  [place].   The patient verbalized understanding of instructions provided today and declined a print copy of patient instruction materials.   Telephone follow up appointment with Managed Medicaid care management team member scheduled for:11/27/20 @ 2:30p  Melissa Montane, RN  Following is a copy of your plan of care:  Patient Care Plan: General Plan of Care (Adult)    Problem Identified: Health Promotion or Disease Self-Management (General Plan of Care)     Long-Range Goal: Self-Management Plan Developed   Start Date: 09/25/2020  Expected End Date: 11/25/2020  Recent Progress: On track  Priority: Medium  Note:   Current Barriers:  . Chronic Disease Management support and education needs related to HIV. Aaron Lee lives with his Dad and Sister. Per his chart, he has not been consistently taking his medications. Aaron Lee reports that he "just don't want to take them everyday". He had a recent ED visit for cellulitis and reports completing antibiotics and improvement. . Literacy barriers . Non-adherence to prescribed medication regimen . Cognitive Deficits  Nurse Case Manager Clinical Goal(s):  Marland Kitchen Over the next 30 days, patient will meet with RN Care Manager to address improving health maintenance . Over the next 30 days, patient will attend all scheduled medical appointments . Over the next 30 days, patient will demonstrate improved adherence to prescribed treatment plan for HIV as evidenced by taking prescribed medications daily and improving lab results   Interventions:  . Inter-disciplinary care team collaboration (see longitudinal plan of care) . Advised patient to take medication as prescribed,  eat a healthy diet with fruits and vegetables daily, decrease soda intake and increase water intake . Reviewed medications with patient and discussed adherence to medication regimen . Discussed plans with patient for ongoing care management follow up and provided patient with direct contact information for care management team . Reviewed scheduled/upcoming provider appointments . Encouraged Aaron Lee to set an alarm to remind him to take his medication everyday  Patient Goals/Self-Care Activities Over the next 30 days, patient will:  -Self administers medications as prescribed  -set an alarm to remind you to take your medicine everyday  -Attends all scheduled provider appointments  -Calls pharmacy for medication refills  -Calls provider office for new concerns or questions  Follow Up Plan: Telephone follow up appointment with Managed Medicaid care management  team member scheduled for:11/27/20 @ 2:30pm

## 2020-10-29 NOTE — Patient Outreach (Signed)
Medicaid Managed Care   Nurse Care Manager Note  10/29/2020 Name:  JAMARQUIS CRULL MRN:  [id number] DOB:  1991/07/13  Ann Held is an 30 y.o. year old male who is a primary patient of Patient, No Pcp Per.  The [facility] Managed Care Coordination team was consulted for assistance with:    HIV  Mr. Hippler was given information about Medicaid Managed Care Coordination team services today. Ann Held agreed to services and verbal consent obtained.  Engaged with patient by telephone for follow up visit in response to provider referral for case management and/or care coordination services.   Assessments/Interventions:  Review of past medical history, allergies, medications, health status, including review of consultants reports, laboratory and other test data, was performed as part of comprehensive evaluation and provision of chronic care management services.  SDOH (Social Determinants of Health) assessments and interventions performed:   Care Plan  No Known Allergies  Medications Reviewed Today    Reviewed by Heidi Dach, RN (Registered Nurse) on 10/29/20 at 1425  Med List Status: <None>  Medication Order Taking? Sig Documenting Provider Last Dose Status Informant  acetaminophen (TYLENOL 8 HOUR) 650 MG CR tablet [id number] No Take 1 tablet (650 mg total) by mouth every 8 (eight) hours as needed for pain.  Patient not taking: Reported on 10/29/2020   Merrilee Jansky, MD Not Taking Active   bictegravir-emtricitabine-tenofovir AF (BIKTARVY) 50-200-25 MG TABS tablet [id number] Yes Take 1 tablet by mouth daily. Blanchard Kelch, NP Taking Active            Med Note Linna Hoff D   Tue Aug 06, 2020  3:24 PM) Taking sometimes   doxycycline (VIBRAMYCIN) 100 MG capsule [id number] No Take 1 capsule (100 mg total) by mouth 2 (two) times daily.  Patient not taking: Reported on 10/29/2020   Merrilee Jansky, MD Not Taking Active            Med Note [name] A   Tue Oct 29, 2020  2:25 PM) completed          Patient Active Problem List   Diagnosis Date Noted  . Boil, face 10/02/2020  . Alopecia 01/04/2020  . 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 Noncompliance with medication regimen 02/23/2019  . HIV disease (HCC) 02/23/2019  . History of shingles 09/26/2018  . Cognitive developmental delay 10/01/2017  . History of syphilis 08/26/2017  . Anemia 08/02/2017  . AIDS (acquired immune deficiency syndrome) (HCC) 07/26/2017  . Eczema 03/31/2017  . Seasonal allergic rhinitis 03/31/2017    Conditions to be addressed/monitored per PCP order:  HIV  Care Plan : General Plan of Care (Adult)  Updates made by Heidi Dach, RN since 10/29/2020 12:00 AM    Problem: Health Promotion or Disease Self-Management (General Plan of Care)     Long-Range Goal: Self-Management Plan Developed   Start Date: 09/25/2020  Expected End Date: 11/25/2020  Recent Progress: On track  Priority: Medium  Note:   Current Barriers:  . Chronic Disease Management support and education needs related to HIV. Mr Lynch lives with his Dad and Sister. Per his chart, he has not been consistently taking his medications. Mr. Pittinger reports that he "just don't want to take them everyday". He had a recent ED visit for cellulitis and reports completing antibiotics and improvement. . Literacy barriers . Non-adherence to prescribed medication regimen . Cognitive Deficits  Nurse Case Manager Clinical Goal(s):  Marland Kitchen Over the next 30 days,  patient will meet with RN Care Manager to address improving health maintenance . Over the next 30 days, patient will attend all scheduled medical appointments . Over the next 30 days, patient will demonstrate improved adherence to prescribed treatment plan for HIV as evidenced by taking prescribed medications daily and improving lab results   Interventions:  . Inter-disciplinary care team collaboration (see longitudinal plan of care) . Advised patient to take  medication as prescribed, eat a healthy diet with fruits and vegetables daily, decrease soda intake and increase water intake . Reviewed medications with patient and discussed adherence to medication regimen . Discussed plans with patient for ongoing care management follow up and provided patient with direct contact information for care management team . Reviewed scheduled/upcoming provider appointments . Encouraged Mr Doane to set an alarm to remind him to take his medication everyday  Patient Goals/Self-Care Activities Over the next 30 days, patient will:  -Self administers medications as prescribed  -set an alarm to remind you to take your medicine everyday  -Attends all scheduled provider appointments  -Calls pharmacy for medication refills  -Calls provider office for new concerns or questions  Follow Up Plan: Telephone follow up appointment with Managed Medicaid care management team member scheduled for:11/27/20 @ 2:30pm        Follow Up:  Patient agrees to Care Plan and Follow-up.  Plan: The Managed Medicaid care management team will reach out to the patient again over the next 30 days.  Date/time of next scheduled RN care management/care coordination outreach: 11/27/20 @ 2:30  [name] RN, BSN [place]  [employer]/r[employer] Care Coordinator

## 2020-10-31 MED FILL — SULFAMETHOXAZOLE-TMP SS TAB: 400-80 | 30 days supply | Qty: 30 | Fill #6

## 2020-10-31 MED FILL — BIKTARVY 50-200-25 MG TABS: 50-200-25 | 30 days supply | Qty: 30 | Fill #9

## 2020-11-06 ENCOUNTER — Ambulatory Visit: Payer: Medicaid Other | Admitting: Infectious Diseases

## 2020-11-26 MED FILL — SULFAMETHOXAZOLE-TMP SS TAB: 400-80 | 30 days supply | Qty: 30 | Fill #7

## 2020-11-26 MED FILL — BIKTARVY 50-200-25 MG TABS: 50-200-25 | 30 days supply | Qty: 30 | Fill #10

## 2020-11-27 ENCOUNTER — Other Ambulatory Visit: Payer: Self-pay | Admitting: *Deleted

## 2020-11-27 ENCOUNTER — Other Ambulatory Visit: Payer: Self-pay

## 2020-11-27 NOTE — Patient Instructions (Signed)
Visit Information  [name] was given information about Medicaid Managed Care team care coordination services as a part of their [facility] Community Plan Medicaid benefit. Aaron Lee verbally consented to engagement with the [facility] Managed Care team.   For questions related to your [facility], please call: [phone] or visit the homepage here: [email or web address]  If you would like to schedule transportation through your [facility], please call the following number at least 2 days in advance of your appointment: [phone].   [name] - following are the goals we discussed in your visit today:  Goals Addressed            This Visit's Progress   . Manage My Medicine       Timeframe:  Long-Range Goal Priority:  High Start Date:  09/25/20                           Expected End Date:   01/01/21                    Follow Up Date 01/01/21    -Self administers medications as prescribed  -set an alarm to remind you to take your medicine everyday  -Attends all scheduled provider appointments  -Calls pharmacy for medication refills  -Calls provider office for new concerns or questions   Why is this important?   . These steps will help you keep on track with your medicines.          Please see education materials related to healthy diet provided by MyChart link. and as Financial risk analyst.     MyPlate from USDA  MyPlate is an outline of a general healthy diet based on the 2010 Dietary Guidelines for Americans, from the U.S. Department of Agriculture Architect). It sets guidelines for how much food you should eat from each food group based on your age, sex, and level of physical activity. What are tips for following MyPlate? To follow MyPlate recommendations:  Eat a wide variety of fruits and vegetables, grains, and protein foods.  Serve smaller portions and eat  less food throughout the day.  Limit portion sizes to avoid overeating.  Enjoy your food.  Get at least 150 minutes of exercise every week. This is about 30 minutes each day, 5 or more days per week. It can be difficult to have every meal look like MyPlate. Think about MyPlate as eating guidelines for an entire day, rather than each individual meal. Fruits and vegetables  Make half of your plate fruits and vegetables.  Eat many different colors of fruits and vegetables each day.  For a 2,000 calorie daily food plan, eat: ? 2 cups of vegetables every day. ? 2 cups of fruit every day.  1 cup is equal to: ? 1 cup raw or cooked vegetables. ? 1 cup raw fruit. ? 1 medium-sized orange, apple, or banana. ? 1 cup 100% fruit or vegetable juice. ? 2 cups raw leafy greens, such as lettuce, spinach, or kale. ?  cup dried fruit. Grains  One fourth of your plate should be grains.  Make at least half of the grains you eat each day whole grains.  For a 2,000 calorie daily food plan, eat 6 oz of grains every day.  1 oz is equal to: ? 1 slice bread. ? 1 cup cereal. ?  cup cooked rice, cereal, or pasta. Protein  One fourth  of your plate should be protein.  Eat a wide variety of protein foods, including meat, poultry, fish, eggs, beans, nuts, and tofu.  For a 2,000 calorie daily food plan, eat 5 oz of protein every day.  1 oz is equal to: ? 1 oz meat, poultry, or fish. ?  cup cooked beans. ? 1 egg. ?  oz nuts or seeds. ? 1 Tbsp peanut butter. Dairy  Drink fat-free or low-fat (1%) milk.  Eat or drink dairy as a side to meals.  For a 2,000 calorie daily food plan, eat or drink 3 cups of dairy every day.  1 cup is equal to: ? 1 cup milk, yogurt, cottage cheese, or soy milk (soy beverage). ? 2 oz processed cheese. ? 1 oz natural cheese. Fats, oils, salt, and sugars  Only small amounts of oils are recommended.  Avoid foods that are high in calories and low in  nutritional value (empty calories), like foods high in fat or added sugars.  Choose foods that are low in salt (sodium). Choose foods that have less than 140 milligrams (mg) of sodium per serving.  Drink water instead of sugary drinks. Drink enough water each day to keep your urine pale yellow. Where to find support  Work with your health care provider or a nutrition specialist (dietitian) to develop a customized eating plan that is right for you.  Download an app (mobile application) to help you track your daily food intake. Where to find more information  Go to https://www.bernard.org/ for more information. Summary  MyPlate is a general guideline for healthy eating from the USDA. It is based on the 2010 Dietary Guidelines for Americans.  In general, fruits and vegetables should take up  of your plate, grains should take up  of your plate, and protein should take up  of your plate. This information is not intended to replace advice given to you by your health care provider. Make sure you discuss any questions you have with your health care provider. Document Revised: 02/09/2019 Document Reviewed: 12/07/2016 Elsevier Patient Education  2021 [employer].   The patient verbalized understanding of instructions provided today and agreed to receive a mailed copy of patient instruction and/or educational materials.  Telephone follow up appointment with Managed Medicaid care management team member scheduled for:01/01/21 @ 2:30pm  Aaron Dach, RN  Following is a copy of your plan of care:  Patient Care Plan: General Plan of Care (Adult)    Problem Identified: Health Promotion or Disease Self-Management (General Plan of Care)     Long-Range Goal: Self-Management Plan Developed   Start Date: 09/25/2020  Expected End Date: 11/25/2020  Recent Progress: On track  Priority: Medium  Note:   Current Barriers:  . Chronic Disease Management support and education needs related to HIV. [name]  lives with his Dad and Sister. Per his chart, he has not been consistently taking his medications. [name] reports that he "just don't want to take them everyday". He had a recent ED visit for cellulitis and reports completing antibiotics and improvement.-Update-[name] reports that people call him every other day to talk about his medicine and "I don't want to talk about my medicine". He does not take his medication because he does not want to and does not understand why he has to take a medication everyday to stay alive. RNCM explained to him that many people with very different health issues have to take medication daily to stay alive. . Literacy barriers .  Non-adherence to prescribed medication regimen . Cognitive Deficits Nurse Case Manager Clinical Goal(s):  Marland Kitchen Over the next 30 days, patient will meet with RN Care Manager to address improving health maintenance . Over the next 30 days, patient will attend all scheduled medical appointments . Over the next 30 days, patient will demonstrate improved adherence to prescribed treatment plan for HIV as evidenced by taking prescribed medications daily and improving lab results  Interventions:  . Inter-disciplinary care team collaboration (see longitudinal plan of care) . Advised patient to take medication as prescribed, eat a healthy diet with fruits and vegetables daily, decrease soda intake and increase water intake . Reviewed medications with patient and discussed adherence to medication regimen . Discussed plans with patient for ongoing care management follow up and provided patient with direct contact information for care management team . Reviewed scheduled/upcoming provider appointments 12/31/20 with Infectious Disease . Encouraged [name] to set an alarm to remind him to take his medication everyday . Provided therapeutic conversation and encouragement to patient Patient Goals/Self-Care Activities Over the next 30 days, patient  will:  -Self administers medications as prescribed  -set an alarm to remind you to take your medicine everyday  -Attends all scheduled provider appointments  -Calls pharmacy for medication refills  -Calls provider office for new concerns or questions  Follow Up Plan: Telephone follow up appointment with Managed Medicaid care management team member scheduled for:01/01/21 @ 2:30pm

## 2020-11-27 NOTE — Patient Outreach (Signed)
Medicaid Managed Care   Nurse Care Manager Note  11/27/2020 Name:  RAEDEN SCHIPPERS MRN:  [id number] DOB:  March 19, 1991  Ann Held is an 30 y.o. year old male who is a primary patient of Patient, No Pcp Per.  The [facility] Managed Care Coordination team was consulted for assistance with:    HIV  Mr. Cherian was given information about Medicaid Managed Care Coordination team services today. Ann Held agreed to services and verbal consent obtained.  Engaged with patient by telephone for follow up visit in response to provider referral for case management and/or care coordination services.   Assessments/Interventions:  Review of past medical history, allergies, medications, health status, including review of consultants reports, laboratory and other test data, was performed as part of comprehensive evaluation and provision of chronic care management services.  SDOH (Social Determinants of Health) assessments and interventions performed:   Care Plan  No Known Allergies  Medications Reviewed Today    Reviewed by Heidi Dach, RN (Registered Nurse) on 11/27/20 at 1456  Med List Status: <None>  Medication Order Taking? Sig Documenting Provider Last Dose Status Informant  acetaminophen (TYLENOL 8 HOUR) 650 MG CR tablet [id number] No Take 1 tablet (650 mg total) by mouth every 8 (eight) hours as needed for pain.  Patient not taking: No sig reported   Lamptey, Britta Mccreedy, MD Not Taking Active   bictegravir-emtricitabine-tenofovir AF (BIKTARVY) 50-200-25 MG TABS tablet [id number] No Take 1 tablet by mouth daily.  Patient not taking: Reported on 11/27/2020   Blanchard Kelch, NP Not Taking Active            Med Note Linna Hoff D   Tue Aug 06, 2020  3:24 PM) Taking sometimes   doxycycline (VIBRAMYCIN) 100 MG capsule [id number] No Take 1 capsule (100 mg total) by mouth 2 (two) times daily.  Patient not taking: No sig reported   Lamptey, Britta Mccreedy, MD Not Taking Active             Med Note Ardelia Mems, Miarose Lippert A   Tue Oct 29, 2020  2:25 PM) completed          Patient Active Problem List   Diagnosis Date Noted  . Boil, face 10/02/2020  . Alopecia 01/04/2020  . 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 Noncompliance with medication regimen 02/23/2019  . HIV disease (HCC) 02/23/2019  . History of shingles 09/26/2018  . Cognitive developmental delay 10/01/2017  . History of syphilis 08/26/2017  . Anemia 08/02/2017  . AIDS (acquired immune deficiency syndrome) (HCC) 07/26/2017  . Eczema 03/31/2017  . Seasonal allergic rhinitis 03/31/2017    Conditions to be addressed/monitored per PCP order:  HIV  Care Plan : General Plan of Care (Adult)  Updates made by Heidi Dach, RN since 11/27/2020 12:00 AM    Problem: Health Promotion or Disease Self-Management (General Plan of Care)     Long-Range Goal: Self-Management Plan Developed   Start Date: 09/25/2020  Expected End Date: 11/25/2020  Recent Progress: On track  Priority: Medium  Note:   Current Barriers:  . Chronic Disease Management support and education needs related to HIV. Mr Squillace lives with his Dad and Sister. Per his chart, he has not been consistently taking his medications. Mr. Killgore reports that he "just don't want to take them everyday". He had a recent ED visit for cellulitis and reports completing antibiotics and improvement.-Update-Mr. Hostetler reports that people call him every other day to talk about his medicine and "  I don't want to talk about my medicine". He does not take his medication because he does not want to and does not understand why he has to take a medication everyday to stay alive. RNCM explained to him that many people with very different health issues have to take medication daily to stay alive. . Literacy barriers . Non-adherence to prescribed medication regimen . Cognitive Deficits Nurse Case Manager Clinical Goal(s):  Marland Kitchen Over the next 30 days, patient will meet with RN Care Manager  to address improving health maintenance . Over the next 30 days, patient will attend all scheduled medical appointments . Over the next 30 days, patient will demonstrate improved adherence to prescribed treatment plan for HIV as evidenced by taking prescribed medications daily and improving lab results  Interventions:  . Inter-disciplinary care team collaboration (see longitudinal plan of care) . Advised patient to take medication as prescribed, eat a healthy diet with fruits and vegetables daily, decrease soda intake and increase water intake . Reviewed medications with patient and discussed adherence to medication regimen . Discussed plans with patient for ongoing care management follow up and provided patient with direct contact information for care management team . Reviewed scheduled/upcoming provider appointments 12/31/20 with Infectious Disease . Encouraged Mr Pannone to set an alarm to remind him to take his medication everyday . Provided therapeutic conversation and encouragement to patient Patient Goals/Self-Care Activities Over the next 30 days, patient will:  -Self administers medications as prescribed  -set an alarm to remind you to take your medicine everyday  -Attends all scheduled provider appointments  -Calls pharmacy for medication refills  -Calls provider office for new concerns or questions  Follow Up Plan: Telephone follow up appointment with Managed Medicaid care management team member scheduled for:01/01/21 @ 2:30pm        Follow Up:  Patient agrees to Care Plan and Follow-up.  Plan: The Managed Medicaid care management team will reach out to the patient again over the next 30 days.  Date/time of next scheduled RN care management/care coordination outreach: 01/01/21 @ 2:30p  [name] RN, BSN   [facility] Care Coordinator

## 2020-11-29 DIAGNOSIS — L2084 Intrinsic (allergic) eczema: Secondary | ICD-10-CM | POA: Diagnosis not present

## 2020-12-20 ENCOUNTER — Other Ambulatory Visit (HOSPITAL_COMMUNITY): Payer: Self-pay

## 2020-12-21 ENCOUNTER — Other Ambulatory Visit (HOSPITAL_COMMUNITY): Payer: Self-pay

## 2020-12-21 MED FILL — Sulfamethoxazole-Trimethoprim Tab 400-80 MG: ORAL | 30 days supply | Qty: 30 | Fill #0 | Status: AC

## 2020-12-21 MED FILL — Bictegravir-Emtricitabine-Tenofovir AF Tab 50-200-25 MG: ORAL | 30 days supply | Qty: 30 | Fill #0 | Status: AC

## 2020-12-23 ENCOUNTER — Other Ambulatory Visit (HOSPITAL_COMMUNITY): Payer: Self-pay

## 2020-12-24 ENCOUNTER — Other Ambulatory Visit (HOSPITAL_COMMUNITY): Payer: Self-pay

## 2020-12-31 ENCOUNTER — Ambulatory Visit: Payer: Medicaid Other | Admitting: Infectious Diseases

## 2021-01-01 ENCOUNTER — Other Ambulatory Visit: Payer: Self-pay | Admitting: *Deleted

## 2021-01-01 NOTE — Patient Outreach (Signed)
Care Coordination  01/01/2021  Aaron Lee 01/25/1991 [id number]    Medicaid Managed Care   Unsuccessful Outreach Note  01/01/2021 Name: Aaron Lee MRN: [id number] DOB: [birth year]  Referred by: Patient, No Pcp Per (Inactive) Reason for referral : High Risk Managed Medicaid (Unsuccessful RNCM follow up outreach)   An unsuccessful telephone outreach was attempted today. The patient was referred to the case management team for assistance with care management and care coordination.   Follow Up Plan: The care management team will reach out to the patient again over the next 7-14 days.   [name] RN, BSN [place]  [facility]

## 2021-01-01 NOTE — Patient Instructions (Signed)
Visit Information  [name]  - as a part of your Medicaid benefit, you are eligible for care management and care coordination services at no cost or copay. I was unable to reach you by phone today but would be happy to help you with your health related needs. Please feel free to call me @ [phone].   A member of the Managed Medicaid care management team will reach out to you again over the next 7-14 days.   [name] RN, BSN [place]  [facility] [employer]

## 2021-01-13 ENCOUNTER — Telehealth: Payer: Self-pay

## 2021-01-13 NOTE — Telephone Encounter (Signed)
I spoke to Aaron Lee today to see if he wanted to reschedule his phone visit with the MM RNCM. He said he would rather not reschedule.

## 2021-01-14 ENCOUNTER — Other Ambulatory Visit: Payer: Self-pay

## 2021-01-14 ENCOUNTER — Other Ambulatory Visit: Payer: Self-pay | Admitting: *Deleted

## 2021-01-14 NOTE — Patient Outreach (Signed)
Care Coordination  01/14/2021  LADARIAN BONCZEK December 06, 1990 [id number]     Ann Held was referred to the [facility] Managed Care High Risk team for assistance with care coordination and care management services. Care coordination/care management services as part of the Medicaid benefit was offered to the patient today. The patient declined assistance offered today.   Plan: The Medicaid Managed Care High Risk team is available at any time in the future to assist with care coordination/care management services upon referral.   [name] RN, BSN [place]  [facility] RN Care Coordinator

## 2021-01-15 ENCOUNTER — Other Ambulatory Visit (HOSPITAL_COMMUNITY): Payer: Self-pay

## 2021-01-15 ENCOUNTER — Other Ambulatory Visit: Payer: Self-pay | Admitting: Infectious Diseases

## 2021-01-15 DIAGNOSIS — B20 Human immunodeficiency virus [HIV] disease: Secondary | ICD-10-CM

## 2021-01-15 MED ORDER — BIKTARVY 50-200-25 MG PO TABS
1.0000 | ORAL_TABLET | Freq: Every day | ORAL | 0 refills | Status: DC
  Filled 2021-01-15: qty 30, 30d supply, fill #0

## 2021-01-15 MED FILL — Sulfamethoxazole-Trimethoprim Tab 400-80 MG: ORAL | 30 days supply | Qty: 30 | Fill #1 | Status: AC

## 2021-01-15 MED FILL — Sulfamethoxazole-Trimethoprim Tab 400-80 MG: ORAL | 30 days supply | Qty: 30 | Fill #1 | Status: CN

## 2021-01-16 ENCOUNTER — Other Ambulatory Visit (HOSPITAL_COMMUNITY): Payer: Self-pay

## 2021-01-21 ENCOUNTER — Encounter: Payer: Self-pay | Admitting: Infectious Diseases

## 2021-01-21 ENCOUNTER — Other Ambulatory Visit: Payer: Self-pay

## 2021-01-21 ENCOUNTER — Ambulatory Visit (INDEPENDENT_AMBULATORY_CARE_PROVIDER_SITE_OTHER): Payer: Medicaid Other | Admitting: Infectious Diseases

## 2021-01-21 VITALS — BP 123/85 | HR 94 | Temp 97.8°F | Resp 16 | Ht 63.0 in | Wt 106.0 lb

## 2021-01-21 DIAGNOSIS — Z21 Asymptomatic human immunodeficiency virus [HIV] infection status: Secondary | ICD-10-CM

## 2021-01-21 DIAGNOSIS — L2082 Flexural eczema: Secondary | ICD-10-CM

## 2021-01-21 DIAGNOSIS — B20 Human immunodeficiency virus [HIV] disease: Secondary | ICD-10-CM

## 2021-01-21 NOTE — Progress Notes (Signed)
u    Name: Aaron Lee  DOB: 12-Jul-1991  MRN: [id number]  PCP: Patient, No Pcp Per (Inactive)     SUBJECTIVE: Aaron Lee is a 30 y.o. male with HIV and AIDS in November 2018 with CD4 nadir < 10, VL 192,000 copies.  HIV Risk: bisexual.  OI Hx: shingles  Previous Regimens:   Biktarvy --> suppressed (changed to PI for adherence concern)  Symtuza 2020  Genotype:   07/2017 - K103S (R-nevirapine)  05-2019 - K103S, no integrase mutations   10/2019 - K103S, no integrase mutations.     Chief Complaint  Patient presents with  . Follow-up    B20 follow up   HIV / AIDS FU     HPI: He tells me that he has been taking his Biktarvy everyday except for 1 dose since our last visit 3 months ago. The big difference is that his father is now giving him his pill every single day. If he had to leave it up to him he would not do it. He does think that would change at some point however. He would be willing to still do the injections for treatment.  Over the last 3 months he has noticed significant improvement in his skin and resolution of many of his abscesses. He is happy about this.    Review of Systems  Constitutional: Negative for chills and fever.  HENT: Negative for sore throat.        No dental problems  Respiratory: Negative for cough.   Cardiovascular: Negative for chest pain and leg swelling.  Gastrointestinal: Negative for abdominal pain, diarrhea and vomiting.  Genitourinary: Negative for dysuria and flank pain.  Musculoskeletal: Negative for myalgias and neck pain.  Skin: Negative for rash.  Neurological: Negative for dizziness and headaches.  Psychiatric/Behavioral: The patient is not nervous/anxious.      Past Medical History:  Diagnosis Date  . Eczema   . HIV (human immunodeficiency virus infection) (HCC)   . Shingles outbreak 09/26/2018    Social History   Tobacco Use  . Smoking status: Light Tobacco Smoker    Packs/day: 0.10    Types:  Cigarettes  . Smokeless tobacco: Never Used  Substance Use Topics  . Alcohol use: No    Comment: occ  . Drug use: Not Currently    Frequency: 7.0 times per week    Types: Marijuana    No Known Allergies   Outpatient Medications Prior to Visit  Medication Sig Dispense Refill  . bictegravir-emtricitabine-tenofovir AF (BIKTARVY) 50-200-25 MG TABS tablet Take 1 tablet by mouth daily. 30 tablet 0  . doxycycline (VIBRA-TABS) 100 MG tablet TAKE 1 TABLET BY MOUTH 2 TIMES DAILY FOR 10 DAYS 20 tablet 0  . hydrOXYzine (ATARAX/VISTARIL) 25 MG tablet Take 25 mg by mouth at bedtime.    Marland Kitchen acetaminophen (TYLENOL) 650 MG CR tablet TAKE 1 TABLET BY MOUTH EVERY 8 HOURS AS NEEDED FOR PAIN (Patient not taking: No sig reported) 30 tablet 0  . doxycycline (VIBRAMYCIN) 100 MG capsule TAKE 1 CAPSULE BY MOUTH 2 TIMES DAILY (Patient not taking: No sig reported) 20 capsule 0  . sulfamethoxazole-trimethoprim (BACTRIM) 400-80 MG tablet TAKE 1 TABLET BY MOUTH ONCE A DAY (Patient not taking: Reported on 09/25/2020) 30 tablet 11  . triamcinolone cream (KENALOG) 0.1 % Apply 1 application topically 2 (two) times daily.     No facility-administered medications prior to visit.           Objective: Vitals:   01/21/21 1552  BP: 123/85  Pulse: 94  Resp: 16  Temp: 97.8 F (36.6 C)  SpO2: 100%    Physical Exam Vitals reviewed.  Constitutional:      Appearance: He is well-developed.     Comments: Thin appearing but overall looks healthier than last visit  HENT:     Mouth/Throat:     Dentition: Normal dentition. No dental abscesses.  Cardiovascular:     Rate and Rhythm: Normal rate and regular rhythm.     Heart sounds: Normal heart sounds.  Pulmonary:     Effort: Pulmonary effort is normal.     Breath sounds: Normal breath sounds.  Abdominal:     General: There is no distension.     Palpations: Abdomen is soft.     Tenderness: There is no abdominal tenderness.  Lymphadenopathy:     Cervical: No cervical  adenopathy.  Skin:    General: Skin is warm and dry.     Findings: No rash.  Neurological:     Mental Status: He is alert and oriented to person, place, and time.  Psychiatric:        Judgment: Judgment normal.     Comments: In good spirits today and engaged in care discussion.       Lab Results Lab Results  Component Value Date   WBC 4.1 08/06/2020   HGB 10.4 (L) 08/06/2020   HCT 32.6 (L) 08/06/2020   MCV 85.8 08/06/2020   PLT 105 (L) 08/06/2020    Lab Results  Component Value Date   CREATININE 0.93 08/06/2020   BUN 12 08/06/2020   NA 138 08/06/2020   K 3.4 (L) 08/06/2020   CL 102 08/06/2020   CO2 28 08/06/2020    Lab Results  Component Value Date   ALT 5 (L) 08/06/2020   AST 15 08/06/2020   BILITOT 0.4 08/06/2020    HIV 1 RNA Quant  Date Value  10/02/2020 493,000 Copies/mL (H)  08/06/2020 611,000 copies/mL (H)  04/25/2020 335,000 Copies/mL (H)   CD4 T Cell Abs (/uL)  Date Value  08/06/2020 <35 (L)  02/26/2020 <35 (L)  08/01/2019 <35 (L)    Assessment and Plan: Problem List Items Addressed This Visit      High   HIV disease (HCC) (Chronic)    Poorly controlled for > 2 years now. He reports that his father has been giving him his medication everyday and will continue to do so for him. I worry about his long-term independence with this. I think he would be a great candidate for our ACTG study looking at poorly adherence patients and transitioning to cabevuva injections.  Will meet with research team and review.   VL today and he will come back in 3 months.       AIDS (acquired immune deficiency syndrome) (HCC) (Chronic)    Continue Bactrim once daily. I suspect he will need this for another 6 months+ given how long he has been poorly adherent to therapy. No OI's identified on exam today. Reports improvement in skin condition.         Unprioritized   Eczema    Under much better control of late.        Other Visit Diagnoses    Asymptomatic HIV  infection, with no history of HIV-related illness (HCC)    -  Primary   Relevant Orders   HIV-1 RNA quant-no reflex-bld      Rexene Alberts, MSN, NP-C [facility]  [email or web address] Pager: [phone] Office: [phone] [facility] Main Line: [phone]  01/21/2021

## 2021-01-21 NOTE — Patient Instructions (Signed)
Please stop by the lab on your way out.   Please review the consent for the research trial with your parents. I think you would be a great candidate for this and will get you on the shots every 2 months so you don't need to worry about pills everyday.   See you in 3 months!

## 2021-01-21 NOTE — Assessment & Plan Note (Signed)
Poorly controlled for > 2 years now. He reports that his father has been giving him his medication everyday and will continue to do so for him. I worry about his long-term independence with this. I think he would be a great candidate for our ACTG study looking at poorly adherence patients and transitioning to cabevuva injections.  Will meet with research team and review.   VL today and he will come back in 3 months.

## 2021-01-21 NOTE — Assessment & Plan Note (Signed)
Under much better control of late.

## 2021-01-21 NOTE — Assessment & Plan Note (Signed)
Continue Bactrim once daily. I suspect he will need this for another 6 months+ given how long he has been poorly adherent to therapy. No OI's identified on exam today. Reports improvement in skin condition.

## 2021-01-23 LAB — HIV-1 RNA QUANT-NO REFLEX-BLD
HIV 1 RNA Quant: 85 Copies/mL — ABNORMAL HIGH
HIV-1 RNA Quant, Log: 1.93 Log cps/mL — ABNORMAL HIGH

## 2021-01-28 ENCOUNTER — Encounter: Payer: Medicaid Other | Admitting: *Deleted

## 2021-01-31 DIAGNOSIS — L2084 Intrinsic (allergic) eczema: Secondary | ICD-10-CM | POA: Diagnosis not present

## 2021-02-12 ENCOUNTER — Other Ambulatory Visit (HOSPITAL_COMMUNITY): Payer: Self-pay

## 2021-02-12 ENCOUNTER — Other Ambulatory Visit: Payer: Self-pay | Admitting: Infectious Diseases

## 2021-02-12 DIAGNOSIS — B20 Human immunodeficiency virus [HIV] disease: Secondary | ICD-10-CM

## 2021-02-12 MED ORDER — BIKTARVY 50-200-25 MG PO TABS
1.0000 | ORAL_TABLET | Freq: Every day | ORAL | 2 refills | Status: DC
  Filled 2021-02-12 – 2021-03-12 (×2): qty 30, 30d supply, fill #0
  Filled 2021-04-02 – 2021-04-07 (×2): qty 30, 30d supply, fill #1
  Filled 2021-05-05: qty 30, 30d supply, fill #2

## 2021-02-22 ENCOUNTER — Other Ambulatory Visit (HOSPITAL_COMMUNITY): Payer: Self-pay

## 2021-03-03 ENCOUNTER — Other Ambulatory Visit: Payer: Self-pay | Admitting: Family

## 2021-03-03 DIAGNOSIS — Z21 Asymptomatic human immunodeficiency virus [HIV] infection status: Secondary | ICD-10-CM

## 2021-03-12 ENCOUNTER — Other Ambulatory Visit (HOSPITAL_COMMUNITY): Payer: Self-pay

## 2021-03-19 ENCOUNTER — Other Ambulatory Visit: Payer: Self-pay | Admitting: Family

## 2021-03-19 DIAGNOSIS — Z21 Asymptomatic human immunodeficiency virus [HIV] infection status: Secondary | ICD-10-CM

## 2021-04-02 ENCOUNTER — Other Ambulatory Visit (HOSPITAL_COMMUNITY): Payer: Self-pay

## 2021-04-07 ENCOUNTER — Other Ambulatory Visit (HOSPITAL_COMMUNITY): Payer: Self-pay

## 2021-05-01 ENCOUNTER — Other Ambulatory Visit (HOSPITAL_COMMUNITY): Payer: Self-pay

## 2021-05-05 ENCOUNTER — Other Ambulatory Visit (HOSPITAL_COMMUNITY): Payer: Self-pay

## 2021-05-07 ENCOUNTER — Other Ambulatory Visit (HOSPITAL_COMMUNITY): Payer: Self-pay

## 2021-05-13 ENCOUNTER — Ambulatory Visit: Payer: Medicaid Other | Admitting: Infectious Diseases

## 2021-05-21 ENCOUNTER — Ambulatory Visit: Payer: Medicaid Other | Admitting: Infectious Diseases

## 2021-05-22 ENCOUNTER — Ambulatory Visit: Payer: Medicaid Other | Admitting: Infectious Diseases

## 2021-05-28 ENCOUNTER — Other Ambulatory Visit (HOSPITAL_COMMUNITY): Payer: Self-pay

## 2021-05-28 ENCOUNTER — Other Ambulatory Visit: Payer: Self-pay | Admitting: Infectious Diseases

## 2021-05-28 DIAGNOSIS — B20 Human immunodeficiency virus [HIV] disease: Secondary | ICD-10-CM

## 2021-05-28 MED ORDER — BIKTARVY 50-200-25 MG PO TABS
1.0000 | ORAL_TABLET | Freq: Every day | ORAL | 0 refills | Status: DC
  Filled 2021-06-05: qty 30, 30d supply, fill #0

## 2021-05-30 ENCOUNTER — Other Ambulatory Visit (HOSPITAL_COMMUNITY): Payer: Self-pay

## 2021-06-05 ENCOUNTER — Other Ambulatory Visit (HOSPITAL_COMMUNITY): Payer: Self-pay

## 2021-06-11 ENCOUNTER — Ambulatory Visit (INDEPENDENT_AMBULATORY_CARE_PROVIDER_SITE_OTHER): Payer: Medicaid Other | Admitting: Infectious Diseases

## 2021-06-11 ENCOUNTER — Encounter: Payer: Self-pay | Admitting: Infectious Diseases

## 2021-06-11 ENCOUNTER — Other Ambulatory Visit: Payer: Self-pay

## 2021-06-11 ENCOUNTER — Ambulatory Visit: Payer: Medicaid Other

## 2021-06-11 VITALS — BP 99/62 | HR 101 | Temp 98.6°F | Wt 107.0 lb

## 2021-06-11 DIAGNOSIS — Z23 Encounter for immunization: Secondary | ICD-10-CM

## 2021-06-11 DIAGNOSIS — Z8619 Personal history of other infectious and parasitic diseases: Secondary | ICD-10-CM

## 2021-06-11 DIAGNOSIS — Z21 Asymptomatic human immunodeficiency virus [HIV] infection status: Secondary | ICD-10-CM

## 2021-06-11 DIAGNOSIS — B20 Human immunodeficiency virus [HIV] disease: Secondary | ICD-10-CM | POA: Diagnosis not present

## 2021-06-11 MED ORDER — BIKTARVY 50-200-25 MG PO TABS
1.0000 | ORAL_TABLET | Freq: Every day | ORAL | 5 refills | Status: DC
Start: 2021-06-11 — End: 2021-08-26

## 2021-06-11 MED ORDER — SULFAMETHOXAZOLE-TRIMETHOPRIM 400-80 MG PO TABS: 1.0000 | ORAL_TABLET | Freq: Every day | ORAL | 5 refills | Status: DC

## 2021-06-11 NOTE — Assessment & Plan Note (Signed)
Repeat RPR today.  

## 2021-06-11 NOTE — Progress Notes (Signed)
   Covid-19 Vaccination Clinic  Name:  Aaron Lee    MRN: [id number] DOB: [birth year]  06/11/2021  [name] was observed post Covid-19 immunization for 15 minutes without incident. He was provided with Vaccine Information Sheet and instruction to access the V-Safe system.   [name] was instructed to call 911 with any severe reactions post vaccine: Difficulty breathing  Swelling of face and throat  A fast heartbeat  A bad rash all over body  Dizziness and weakness

## 2021-06-11 NOTE — Patient Instructions (Addendum)
Please stop by the lab on your way out.   I am hopeful your blood work looks good today.   Please continue your biktarvy once a day. I will refill your antibiotic (white pill) once a day until your immune system gets stronger - we will see what it looks like today.   Please come back in 3 months to check in with our pharmacy team again - I will be out of the office for a short period of time.

## 2021-06-11 NOTE — Progress Notes (Signed)
u      Name: Aaron Lee  DOB: 02-19-1991  MRN: [id number]  PCP: Patient, No Pcp Per (Inactive)     SUBJECTIVE: Aaron Lee is a 30 y.o. male with HIV and AIDS in November 2018 with CD4 nadir < 10, VL 192,000 copies.  HIV Risk: bisexual.  OI Hx: shingles  Previous Regimens:  Biktarvy --> suppressed (changed to PI for adherence concern) Symtuza 2020  Genotype:  07/2017 - K103S (R-nevirapine) 05-2019 - K103S, no integrase mutations  10/2019 - K103S, no integrase mutations.     Chief Complaint  Patient presents with   Follow-up    B20  HIV / AIDS FU     HPI: Aaron Lee says he has been doing well and taking his Biktarvy with few missed doses. "I am trying really hard". He ran out of the bactrim (the white pill) and cannot recall when he last took it.  He is still living with his parents currently and looking for housing. Working with Aaron Lee with [employer].  No sexual partners/encounters over the last 6 months. His skin seems to have been better and no rashes / flares of eczema.     Review of Systems  Constitutional:  Negative for chills and fever.  HENT:  Negative for sore throat.        No dental problems  Respiratory:  Negative for cough.   Cardiovascular:  Negative for chest pain and leg swelling.  Gastrointestinal:  Negative for abdominal pain, diarrhea and vomiting.  Genitourinary:  Negative for dysuria and flank pain.  Musculoskeletal:  Negative for myalgias and neck pain.  Skin:  Negative for rash.  Neurological:  Negative for dizziness and headaches.  Psychiatric/Behavioral:  The patient is not nervous/anxious.     Past Medical History:  Diagnosis Date   Eczema    HIV (human immunodeficiency virus infection) (HCC)    Shingles outbreak 09/26/2018    Social History   Tobacco Use   Smoking status: Light Smoker    Packs/day: 0.10    Types: Cigarettes   Smokeless tobacco: Never  Substance Use Topics   Alcohol use: Yes    Comment: occ   Drug use:  Not Currently    Frequency: 7.0 times per week    Types: Marijuana    No Known Allergies   Outpatient Medications Prior to Visit  Medication Sig Dispense Refill   bictegravir-emtricitabine-tenofovir AF (BIKTARVY) 50-200-25 MG TABS tablet Take 1 tablet by mouth daily. 30 tablet 0   No facility-administered medications prior to visit.           Objective: Vitals:   06/11/21 1552  BP: 99/62  Pulse: (!) 101  Temp: 98.6 F (37 C)    Physical Exam Vitals reviewed.  Constitutional:      Appearance: He is well-developed.     Comments: Thin appearing but overall looks healthier than last visit  HENT:     Mouth/Throat:     Dentition: Normal dentition. No dental abscesses.  Cardiovascular:     Rate and Rhythm: Normal rate and regular rhythm.     Heart sounds: Normal heart sounds.  Pulmonary:     Effort: Pulmonary effort is normal.     Breath sounds: Normal breath sounds.  Abdominal:     General: There is no distension.     Palpations: Abdomen is soft.     Tenderness: There is no abdominal tenderness.  Lymphadenopathy:     Cervical: No cervical adenopathy.  Skin:    General:  Skin is warm and dry.     Findings: No rash.  Neurological:     Mental Status: He is alert and oriented to person, place, and time.  Psychiatric:        Judgment: Judgment normal.     Comments: In good spirits today and engaged in care discussion.      Lab Results Lab Results  Component Value Date   WBC 4.1 08/06/2020   HGB 10.4 (L) 08/06/2020   HCT 32.6 (L) 08/06/2020   MCV 85.8 08/06/2020   PLT 105 (L) 08/06/2020    Lab Results  Component Value Date   CREATININE 0.93 08/06/2020   BUN 12 08/06/2020   NA 138 08/06/2020   K 3.4 (L) 08/06/2020   CL 102 08/06/2020   CO2 28 08/06/2020    Lab Results  Component Value Date   ALT 5 (L) 08/06/2020   AST 15 08/06/2020   BILITOT 0.4 08/06/2020    HIV 1 RNA Quant  Date Value  01/21/2021 85 Copies/mL (H)  10/02/2020 493,000 Copies/mL (H)   08/06/2020 611,000 copies/mL (H)   CD4 T Cell Abs (/uL)  Date Value  08/06/2020 <35 (L)  02/26/2020 <35 (L)  08/01/2019 <35 (L)    Assessment and Plan: Problem List Items Addressed This Visit       High   HIV disease (HCC) (Chronic)    Celebrated his last VL was only 82. I am hopeful it is still in a good range. If he is viremic will refer to ACTG team for non-adherent study to help him transition to cabenuva injections. If he is suppressed we can still offer this to him if he is interested now.  Flu shot and COVID bivalent booster today.  FU with pharmacy in 44m       Relevant Medications   bictegravir-emtricitabine-tenofovir AF (BIKTARVY) 50-200-25 MG TABS tablet   sulfamethoxazole-trimethoprim (BACTRIM) 400-80 MG tablet   AIDS (acquired immune deficiency syndrome) (HCC) (Chronic)    Will refill his bactrim and follow up cd4 from today. Historically has been < 35 and may take several years on treatment to recover. No OIs or AIDS defining conditions present on exam/history today.       Relevant Medications   bictegravir-emtricitabine-tenofovir AF (BIKTARVY) 50-200-25 MG TABS tablet   sulfamethoxazole-trimethoprim (BACTRIM) 400-80 MG tablet     Unprioritized   History of syphilis    Repeat RPR today      Other Visit Diagnoses     Need for immunization against influenza    -  Primary   Relevant Orders   Flu Vaccine QUAD 32mo+IM (Fluarix, Fluzone & Alfiuria Quad PF) (Completed)   Asymptomatic HIV infection, with no history of HIV-related illness (HCC)       Relevant Medications   bictegravir-emtricitabine-tenofovir AF (BIKTARVY) 50-200-25 MG TABS tablet   sulfamethoxazole-trimethoprim (BACTRIM) 400-80 MG tablet   Other Relevant Orders   HIV-1 RNA quant-no reflex-bld   T-helper cell (CD4)- ([facility] clinic only)   RPR       Aaron Alberts, MSN, NP-C [facility]  [email or web address] Pager:  [phone] Office: [phone] [facility] Main Line: [phone]  06/11/2021

## 2021-06-11 NOTE — Assessment & Plan Note (Addendum)
Celebrated his last VL was only 82. I am hopeful it is still in a good range. If he is viremic will refer to ACTG team for non-adherent study to help him transition to cabenuva injections. If he is suppressed we can still offer this to him if he is interested now.  Flu shot and COVID bivalent booster today.  FU with pharmacy in 47m

## 2021-06-11 NOTE — Assessment & Plan Note (Signed)
Will refill his bactrim and follow up cd4 from today. Historically has been < 35 and may take several years on treatment to recover. No OIs or AIDS defining conditions present on exam/history today.

## 2021-06-13 LAB — RPR: RPR Ser Ql: REACTIVE — AB

## 2021-06-13 LAB — RPR TITER: RPR Titer: 1:2 {titer} — ABNORMAL HIGH

## 2021-06-13 LAB — T-HELPER CELL (CD4) - (RCID CLINIC ONLY)
CD4 % Helper T Cell: 6 % — ABNORMAL LOW (ref 33–65)
CD4 T Cell Abs: <35 /uL — ABNORMAL LOW (ref 400–1790)

## 2021-06-13 LAB — HIV-1 RNA QUANT-NO REFLEX-BLD
HIV 1 RNA Quant: 412000 Copies/mL — ABNORMAL HIGH
HIV-1 RNA Quant, Log: 5.61 Log cps/mL — ABNORMAL HIGH

## 2021-06-13 LAB — FLUORESCENT TREPONEMAL AB(FTA)-IGG-BLD: Fluorescent Treponemal ABS: REACTIVE — AB

## 2021-06-19 ENCOUNTER — Telehealth: Payer: Self-pay | Admitting: Infectious Diseases

## 2021-06-19 NOTE — Telephone Encounter (Signed)
I called Aaron Lee to go over his lab results.   Again his VL is > 400,000 and CD4 remains abysmally low at < 35 as he continues to demonstrate poor adherence to biktarvy.   We again talked about partnering with our research team to see if we can get him into [id number] study to hopefully bridge him to long acting cabenuva.  He is interested in this study and would like to be reached on his cell phone to discuss further and set up intake. I informed him that he may need to have his mother/father come to [place] consent given legal guardianship.  He understands and had no further questions.   Will forward to research team (looks like he is on their radar from what I can see in Epic flags).    Rexene Alberts, MSN, NP-C [facility]  [email or web address] Pager: [phone] Office: [phone] [facility] Main Line: [phone]

## 2021-06-19 NOTE — Telephone Encounter (Signed)
-----   Message from Phill Myron, RN sent at 06/18/2021 12:19 PM EDT ----- Regarding: [id number] study I believe I had talked to him before about the study and he wasn't interested, but I may be mistaken. If he is willing to come in and screen maybe we can help.  I know there was an issue about his cognition and if he understood what it was about. Does he need a parent to give consent? ----- Message ----- From: Blanchard Kelch, NP Sent: 06/13/2021   6:06 PM EDT To: Phill Myron, RN Subject: FW:                                            Please consider [name] for the non adherent study  I am beyond out of ideas for him to make him successful. ----- Message ----- From: Interface, [facility] Lab Results In Sent: 06/12/2021   9:40 AM EDT To: Blanchard Kelch, NP

## 2021-06-24 ENCOUNTER — Other Ambulatory Visit (HOSPITAL_COMMUNITY): Payer: Self-pay

## 2021-06-24 ENCOUNTER — Other Ambulatory Visit: Payer: Self-pay | Admitting: Infectious Diseases

## 2021-06-24 DIAGNOSIS — B20 Human immunodeficiency virus [HIV] disease: Secondary | ICD-10-CM

## 2021-07-24 ENCOUNTER — Other Ambulatory Visit (HOSPITAL_COMMUNITY): Payer: Self-pay

## 2021-08-05 ENCOUNTER — Encounter: Payer: Medicaid Other | Admitting: *Deleted

## 2021-08-11 ENCOUNTER — Encounter: Payer: Medicaid Other | Admitting: *Deleted

## 2021-08-19 ENCOUNTER — Other Ambulatory Visit (HOSPITAL_COMMUNITY): Payer: Self-pay

## 2021-08-19 ENCOUNTER — Other Ambulatory Visit: Payer: Self-pay | Admitting: Pharmacist

## 2021-08-19 ENCOUNTER — Telehealth: Payer: Self-pay

## 2021-08-19 DIAGNOSIS — B20 Human immunodeficiency virus [HIV] disease: Secondary | ICD-10-CM

## 2021-08-19 MED ORDER — CABOTEGRAVIR & RILPIVIRINE ER 600 & 900 MG/3ML IM SUER
1.0000 | INTRAMUSCULAR | 5 refills | Status: DC
  Filled 2021-08-19: qty 6, 60d supply, fill #0

## 2021-08-19 MED ORDER — CABOTEGRAVIR & RILPIVIRINE ER 600 & 900 MG/3ML IM SUER
1.0000 | INTRAMUSCULAR | 1 refills | Status: DC
  Filled 2021-08-19 (×2): qty 6, 30d supply, fill #0

## 2021-08-19 NOTE — Telephone Encounter (Addendum)
Called patient to set up first Cabenunva injection.He is scheduled with Cassie on 08/26/21 @ 10:45. We will make future appointments at this visit.   Counseled that [place] is two separate intramuscular injections in the gluteal muscle on each side for each visit. Explained that the second injection is 30 days after the initial injection then every 2 months thereafter. Discussed the need for viral load monitoring every 2 months for the first 6 months and then periodically afterwards as their provider sees the need. Discussed the rare but significant chance of developing resistance despite compliance. Explained that showing up to injection appointments is very important and warned that if 2 appointments are missed, it will be reassessed by their provider whether they are a good candidate for injection therapy. Counseled on possible side effects associated with the injections such as injection site pain, which is usually mild to moderate in nature, injection site nodules, and injection site reactions.   Shirlee More, PharmD PGY2 Infectious Diseases Pharmacy Resident

## 2021-08-20 ENCOUNTER — Other Ambulatory Visit (HOSPITAL_COMMUNITY): Payer: Self-pay

## 2021-08-20 ENCOUNTER — Ambulatory Visit: Payer: Medicaid Other | Admitting: Pharmacist

## 2021-08-21 ENCOUNTER — Telehealth: Payer: Self-pay

## 2021-08-21 NOTE — Telephone Encounter (Signed)
[facility] Patient Advocate Encounter  Patient's medication (Cabenuva) have been couriered to [facility] from [facility] and will be administered on patient next office visit on 08/26/21.  Rene Gonsoulin , CPhT Specialty Pharmacy Patient Advocate [facility] Phone: [phone] Fax:  [phone]  

## 2021-08-26 ENCOUNTER — Ambulatory Visit (INDEPENDENT_AMBULATORY_CARE_PROVIDER_SITE_OTHER): Payer: Medicaid Other | Admitting: Pharmacist

## 2021-08-26 ENCOUNTER — Other Ambulatory Visit: Payer: Self-pay

## 2021-08-26 DIAGNOSIS — B20 Human immunodeficiency virus [HIV] disease: Secondary | ICD-10-CM | POA: Diagnosis not present

## 2021-08-26 MED ORDER — CABOTEGRAVIR & RILPIVIRINE ER 600 & 900 MG/3ML IM SUER
1.0000 | Freq: Once | INTRAMUSCULAR | Status: AC
  Administered 2021-08-26: 1 via INTRAMUSCULAR

## 2021-08-26 NOTE — Progress Notes (Signed)
HPI: Aaron Lee is a 30 y.o. male who presents to the [place] clinic for [place] administration.  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   Noncompliance with medication regimen 02/23/2019   HIV disease ([place]) 02/23/2019   History of shingles 09/26/2018   Closed fracture of fifth metacarpal bone 05/05/2018   Pain of left hand 04/11/2018   Cognitive developmental delay 10/01/2017   History of syphilis 08/26/2017   Anemia 08/02/2017   AIDS (acquired immune deficiency syndrome) ([place]) 07/26/2017   Eczema 03/31/2017   Seasonal allergic rhinitis 03/31/2017    Patient's Medications  New Prescriptions   No medications on file  Previous Medications   BICTEGRAVIR-EMTRICITABINE-TENOFOVIR AF (BIKTARVY) 50-200-25 MG TABS TABLET    Take 1 tablet by mouth daily.   CABOTEGRAVIR & RILPIVIRINE ER (CABENUVA) 600 & 900 MG/3ML INJECTION    Inject 1 kit into the muscle every 30 (thirty) days.   CABOTEGRAVIR & RILPIVIRINE ER (CABENUVA) 600 & 900 MG/3ML INJECTION    Inject 1 kit into the muscle every 2 (two) months.   SULFAMETHOXAZOLE-TRIMETHOPRIM (BACTRIM) 400-80 MG TABLET    Take 1 tablet by mouth daily.  Modified Medications   No medications on file  Discontinued Medications   No medications on file    Allergies: No Known Allergies  Past Medical History: Past Medical History:  Diagnosis Date   Eczema    HIV (human immunodeficiency virus infection) ([place])    Shingles outbreak 09/26/2018    Social History: Social History   Socioeconomic History   Marital status: Single    Spouse name: Not on file   Number of children: Not on file   Years of education: Not on file   Highest education level: Not on file  Occupational History   Not on file  Tobacco Use   Smoking status: Light Smoker    Packs/day: 0.10    Types: Cigarettes   Smokeless tobacco: Never  Substance and Sexual Activity   Alcohol use: Yes     Comment: occ   Drug use: Not Currently    Frequency: 7.0 times per week    Types: Marijuana   Sexual activity: Yes    Partners: Female, Male    Birth control/protection: Condom    Comment: accepted condoms  Other Topics Concern   Not on file  Social History Narrative   Not on file   Social Determinants of Health   Financial Resource Strain: Not on file  Food Insecurity: Not on file  Transportation Needs: Not on file  Physical Activity: Not on file  Stress: Not on file  Social Connections: Not on file    Labs: Lab Results  Component Value Date   HIV1RNAQUANT 412,000 (H) 06/11/2021   HIV1RNAQUANT 85 (H) 01/21/2021   HIV1RNAQUANT 493,000 (H) 10/02/2020   CD4TABS <35 (L) 06/11/2021   CD4TABS <35 (L) 08/06/2020   CD4TABS <35 (L) 02/26/2020    RPR and STI Lab Results  Component Value Date   LABRPR REACTIVE (A) 06/11/2021   LABRPR REACTIVE (A) 08/06/2020   LABRPR REACTIVE (A) 01/04/2020   LABRPR REACTIVE (A) 04/18/2019   LABRPR REACTIVE (A) 12/27/2018   RPRTITER 1:2 (H) 06/11/2021   RPRTITER 1:2 (H) 08/06/2020   RPRTITER 1:2 (H) 01/04/2020   RPRTITER 1:4 (H) 04/18/2019   RPRTITER 1:8 (H) 12/27/2018    STI Results GC CT  08/06/2020 Negative Negative  08/02/2017 **POSITIVE**(A) Negative  08/02/2017 **POSITIVE**(A) Negative  Hepatitis B Lab Results  Component Value Date   HEPBSAB REACTIVE (A) 08/02/2017   HEPBSAG NON-REACTIVE 08/02/2017   HEPBCAB NON-REACTIVE 08/02/2017   Hepatitis C Lab Results  Component Value Date   HEPCAB NON-REACTIVE 08/02/2017   Hepatitis A Lab Results  Component Value Date   HAV REACTIVE (A) 08/02/2017   Lipids: Lab Results  Component Value Date   CHOL 113 08/06/2020   TRIG 144 08/06/2020   HDL 29 (L) 08/06/2020   CHOLHDL 3.9 08/06/2020   LDLCALC 61 08/06/2020    Current HIV Regimen: Biktarvy (not taking)  TARGET DATE: The 6th of the month  Assessment: Kshawn presents today for their first initiation  injection for Cabenuva. Counseled that [place] is two separate intramuscular injections in the gluteal muscle on each side for each visit. Explained that the second injection is 30 days after the initial injection then every 2 months thereafter. Discussed the need for viral load monitoring every 2 months for the first 6 months and then periodically afterwards as their provider sees the need. Discussed the rare but significant chance of developing resistance despite compliance. Explained that showing up to injection appointments is very important and warned that if 2 appointments are missed, it will be reassessed by their provider whether they are a good candidate for injection therapy. Counseled on possible side effects associated with the injections such as injection site pain, which is usually mild to moderate in nature, injection site nodules, and injection site reactions. Asked to call the clinic or send me a mychart message if they experience any issues, such as fatigue, nausea, headache, rash, or dizziness. Advised that they can take ibuprofen or tylenol for injection site pain if needed.   Administered cabotegravir 678m/3mL in left upper outer quadrant of the gluteal muscle. Administered rilpivirine 900 mg/374min the right upper outer quadrant of the gluteal muscle. Monitored patient for 10 minutes after injection. Injections were tolerated well without issue. Counseled to stop taking Biktarvy after today's dose and to call with any issues that may arise. Will make follow up appointments for second initiation injection in 30 days and then maintenance injections every 2 months thereafter for 6 months.   Plan: - Stop Biktarvy (the red pill); continue Bactrim (the white pill) - First Cabenuva injections administered - [facility] any [facility] or questions  Etola Mull L. Shadie Sweatman, PharmD, BCIDP, AAHIVP, CPFremontlinical Pharmacist  Practitioner [facility] Infectious Disease

## 2021-08-28 ENCOUNTER — Other Ambulatory Visit: Payer: Self-pay | Admitting: Pharmacist

## 2021-08-28 ENCOUNTER — Other Ambulatory Visit (HOSPITAL_COMMUNITY): Payer: Self-pay

## 2021-08-28 DIAGNOSIS — B20 Human immunodeficiency virus [HIV] disease: Secondary | ICD-10-CM

## 2021-08-28 MED ORDER — CABENUVA 400 & 600 MG/2ML IM SUER
1.0000 | INTRAMUSCULAR | 5 refills | Status: DC
  Filled 2021-08-28 – 2021-09-16 (×2): qty 4, 30d supply, fill #0
  Filled 2021-10-23: qty 4, 30d supply, fill #1

## 2021-08-28 NOTE — Progress Notes (Signed)
Sending in new [place] scripts as patient will be on monthly dose (cabotegravir 400 mg & rilpivirine 600 mg).

## 2021-08-29 ENCOUNTER — Other Ambulatory Visit (HOSPITAL_COMMUNITY): Payer: Self-pay

## 2021-09-02 ENCOUNTER — Other Ambulatory Visit (HOSPITAL_COMMUNITY): Payer: Self-pay

## 2021-09-10 ENCOUNTER — Ambulatory Visit: Payer: Medicaid Other | Admitting: Pharmacist

## 2021-09-16 ENCOUNTER — Other Ambulatory Visit (HOSPITAL_COMMUNITY): Payer: Self-pay

## 2021-09-17 ENCOUNTER — Telehealth: Payer: Self-pay

## 2021-09-17 NOTE — Telephone Encounter (Signed)
[facility] Patient Advocate Encounter  Patient's medication (Cabenuva 400 &600mg )  have been couriered to [facility] from [facility] and will be administered on the patient next office visit on 10/01/21.  Clearance Coots , CPhT Specialty Pharmacy Patient [facility] Phone: [phone] Fax:  [phone]

## 2021-10-01 ENCOUNTER — Ambulatory Visit (INDEPENDENT_AMBULATORY_CARE_PROVIDER_SITE_OTHER): Payer: Medicaid Other | Admitting: Infectious Diseases

## 2021-10-01 ENCOUNTER — Other Ambulatory Visit: Payer: Self-pay

## 2021-10-01 ENCOUNTER — Encounter: Payer: Self-pay | Admitting: Infectious Diseases

## 2021-10-01 VITALS — BP 116/80 | HR 105 | Temp 98.5°F | Wt 108.0 lb

## 2021-10-01 DIAGNOSIS — B20 Human immunodeficiency virus [HIV] disease: Secondary | ICD-10-CM

## 2021-10-01 MED ORDER — CABOTEGRAVIR & RILPIVIRINE ER 400 & 600 MG/2ML IM SUER
1.0000 | Freq: Once | INTRAMUSCULAR | Status: AC
Start: 2021-10-01 — End: 2021-10-01
  Administered 2021-10-01: 1 via INTRAMUSCULAR

## 2021-10-01 NOTE — Assessment & Plan Note (Signed)
No AIDS defining conditions identified today. Hopefully he is taking his bactrim for prophylaxis.

## 2021-10-01 NOTE — Addendum Note (Signed)
Addended by: Philippa Chester on: 10/01/2021 04:42 PM   Modules accepted: Orders

## 2021-10-01 NOTE — Assessment & Plan Note (Addendum)
Did well with loading dose of cabenuva with only minor ISR. Will check VL monthly for now to ensure this works for Aaron Lee.   Last VL result:  HIV 1 RNA Quant (Copies/mL)  Date Value  06/11/2021 412,000 (H)    Dose Interval: Q36m Next Appointment: 10/30/2021

## 2021-10-01 NOTE — Progress Notes (Signed)
u      Name: Aaron Lee  DOB: Feb 24, 1991  MRN: [id number]  PCP: Patient, No Pcp Per (Inactive)     SUBJECTIVE: Aaron Lee is a 31 y.o. male with HIV and AIDS in November 2018 with CD4 nadir < 10, VL 192,000 copies.  HIV Risk: bisexual.  OI Hx: shingles  Previous Regimens:  Biktarvy --> suppressed (changed to PI for adherence concern) Symtuza 2020  Genotype:  07/2017 - K103S (R-nevirapine) 05-2019 - K103S, no integrase mutations  10/2019 - K103S, no integrase mutations.     Chief Complaint  Patient presents with   Follow-up    B20 -      HPI: Aaron Lee is here or his second dose of cabenuva. He tolerated the first loading dose well with only minor injection site reactions and mild pain that was resolved with tylenol.   He is happy to be on injectable option. Continues bactrim prophylaxis as often as he can remember.    Review of Systems  Constitutional:  Negative for chills and fever.  HENT:  Negative for sore throat.        No dental problems  Respiratory:  Negative for cough.   Cardiovascular:  Negative for chest pain and leg swelling.  Gastrointestinal:  Negative for abdominal pain, diarrhea and vomiting.  Genitourinary:  Negative for dysuria and flank pain.  Musculoskeletal:  Negative for myalgias and neck pain.  Skin:  Negative for rash.  Neurological:  Negative for dizziness and headaches.  Psychiatric/Behavioral:  The patient is not nervous/anxious.     Past Medical History:  Diagnosis Date   Eczema    HIV (human immunodeficiency virus infection) ([place])    Shingles outbreak 09/26/2018    Social History   Tobacco Use   Smoking status: Light Smoker    Packs/day: 0.10    Types: Cigarettes   Smokeless tobacco: Never  Substance Use Topics   Alcohol use: Yes    Comment: occ   Drug use: Yes    Frequency: 7.0 times per week    Types: Marijuana    No Known Allergies   Outpatient Medications Prior to Visit  Medication Sig Dispense Refill    cabotegravir & rilpivirine ER (CABENUVA) 400 & 600 MG/2ML injection Inject 1 kit into the muscle every 30 (thirty) days. 4 mL 5   sulfamethoxazole-trimethoprim (BACTRIM) 400-80 MG tablet Take 1 tablet by mouth daily. 30 tablet 5   No facility-administered medications prior to visit.           Objective: Vitals:   10/01/21 1552  BP: 116/80  Pulse: (!) 105  Temp: 98.5 F (36.9 C)  SpO2: 100%    Physical Exam Vitals reviewed.  Constitutional:      Appearance: He is well-developed.     Comments: Thin appearing but overall looks healthier than last visit  HENT:     Mouth/Throat:     Dentition: Normal dentition. No dental abscesses.  Cardiovascular:     Rate and Rhythm: Normal rate and regular rhythm.     Heart sounds: Normal heart sounds.  Pulmonary:     Effort: Pulmonary effort is normal.     Breath sounds: Normal breath sounds.  Abdominal:     General: There is no distension.     Palpations: Abdomen is soft.     Tenderness: There is no abdominal tenderness.  Lymphadenopathy:     Cervical: No cervical adenopathy.  Skin:    General: Skin is warm and dry.  Findings: No rash.  Neurological:     Mental Status: He is alert and oriented to person, place, and time.  Psychiatric:        Judgment: Judgment normal.     Comments: In good spirits today and engaged in care discussion.      Lab Results Lab Results  Component Value Date   WBC 4.1 08/06/2020   HGB 10.4 (L) 08/06/2020   HCT 32.6 (L) 08/06/2020   MCV 85.8 08/06/2020   PLT 105 (L) 08/06/2020    Lab Results  Component Value Date   CREATININE 0.93 08/06/2020   BUN 12 08/06/2020   NA 138 08/06/2020   K 3.4 (L) 08/06/2020   CL 102 08/06/2020   CO2 28 08/06/2020    Lab Results  Component Value Date   ALT 5 (L) 08/06/2020   AST 15 08/06/2020   BILITOT 0.4 08/06/2020    HIV 1 RNA Quant (Copies/mL)  Date Value  06/11/2021 412,000 (H)  01/21/2021 85 (H)  10/02/2020 493,000 (H)   CD4 T Cell Abs (/uL)   Date Value  06/11/2021 <35 (L)  08/06/2020 <35 (L)  02/26/2020 <35 (L)    Assessment and Plan: Problem List Items Addressed This Visit       High   AIDS (acquired immune deficiency syndrome) ([place]) - Primary (Chronic)    No AIDS defining conditions identified today. Hopefully he is taking his bactrim for prophylaxis.       Relevant Orders   HIV 1 RNA quant-no reflex-bld   HIV disease ([place]) (Chronic)    Did well with loading dose of cabenuva with only minor ISR. Will check VL monthly for now to ensure this works for him.   Last VL result:  HIV 1 RNA Quant (Copies/mL)  Date Value  06/11/2021 412,000 (H)   Dose Interval: Q45mNext Appointment: 10/30/2021        SJanene Madeira MSN, NP-C RPige[facility] Fallsfor Infectious Disease [email or web address] Pager: [phone]: [place] [phone] 10/01/2021

## 2021-10-03 LAB — HIV-1 RNA QUANT-NO REFLEX-BLD
HIV 1 RNA Quant: 77200 Copies/mL — ABNORMAL HIGH
HIV-1 RNA Quant, Log: 4.89 Log cps/mL — ABNORMAL HIGH

## 2021-10-07 NOTE — Progress Notes (Signed)
Reduction in VL from 412,000 --> 77,200 after loading dose of cabenuva for off label use given his long history of adherence failure and advancing HIV/AIDS.  Will continue to follow with Q1-2 m VL to ensure he suppresses.

## 2021-10-16 ENCOUNTER — Other Ambulatory Visit (HOSPITAL_COMMUNITY): Payer: Self-pay

## 2021-10-17 ENCOUNTER — Other Ambulatory Visit (HOSPITAL_COMMUNITY): Payer: Self-pay

## 2021-10-23 ENCOUNTER — Other Ambulatory Visit (HOSPITAL_COMMUNITY): Payer: Self-pay

## 2021-10-24 ENCOUNTER — Telehealth: Payer: Self-pay

## 2021-10-24 NOTE — Telephone Encounter (Signed)
[facility] Patient Advocate Encounter  Patient's medication Kern Reap) have been couriered to [facility] from [facility] and will be administered on patient next office visit on 10/30/21.  Ileene Patrick , [place] [facility] Patient [facility] for Infect[facility] Phone: [phone] Fax:  [phone]

## 2021-10-30 ENCOUNTER — Ambulatory Visit (INDEPENDENT_AMBULATORY_CARE_PROVIDER_SITE_OTHER): Payer: Medicaid Other | Admitting: Infectious Diseases

## 2021-10-30 ENCOUNTER — Encounter: Payer: Self-pay | Admitting: Infectious Diseases

## 2021-10-30 ENCOUNTER — Other Ambulatory Visit: Payer: Self-pay

## 2021-10-30 VITALS — BP 137/83 | HR 97 | Temp 98.9°F | Wt 111.0 lb

## 2021-10-30 DIAGNOSIS — L2082 Flexural eczema: Secondary | ICD-10-CM | POA: Diagnosis not present

## 2021-10-30 DIAGNOSIS — B20 Human immunodeficiency virus [HIV] disease: Secondary | ICD-10-CM | POA: Diagnosis not present

## 2021-10-30 MED ORDER — CABOTEGRAVIR & RILPIVIRINE ER 400 & 600 MG/2ML IM SUER
1.0000 | Freq: Once | INTRAMUSCULAR | Status: AC
  Administered 2021-10-30: 1 via INTRAMUSCULAR

## 2021-10-30 NOTE — Assessment & Plan Note (Signed)
Encouraged him to start using the triamcinolone cream for his neck rashes.

## 2021-10-30 NOTE — Progress Notes (Signed)
u      Name: Aaron Lee  DOB: Aug 25, 1991  MRN: [id number]  PCP: Patient, No Pcp Per (Inactive)     SUBJECTIVE: Aaron Lee is a 31 y.o. male with HIV and AIDS in November 2018 with CD4 nadir < 10, VL 192,000 copies.  HIV Risk: bisexual.  OI Hx: shingles  Previous Regimens:  Biktarvy --> suppressed (changed to PI for adherence concern) Symtuza 2020 [facility] Dec 2022  Genotype:  07/2017 - K103S (R-nevirapine) 05-2019 - K103S, no integrase mutations  10/2019 - K103S, no integrase mutations.     Chief Complaint  Patient presents with   Follow-up     HPI: Aaron Lee is here or his third dose of cabenuva. He says he is doing well tolerating these. Of course they hurt but not too many lingering side effects. He says he feels better overall. Better appetite.   Has some skin rashes on the side of his right neck that itch.   He does his best with bactrim adherence.    Review of Systems  Constitutional:  Negative for chills and fever.  HENT:  Negative for sore throat.        No dental problems  Respiratory:  Negative for cough.   Cardiovascular:  Negative for chest pain and leg swelling.  Gastrointestinal:  Negative for abdominal pain, diarrhea and vomiting.  Genitourinary:  Negative for dysuria and flank pain.  Musculoskeletal:  Negative for myalgias and neck pain.  Skin:  Positive for rash.  Neurological:  Negative for dizziness and headaches.  Psychiatric/Behavioral:  The patient is not nervous/anxious.     Past Medical History:  Diagnosis Date   Eczema    HIV (human immunodeficiency virus infection) ([place])    Shingles outbreak 09/26/2018    Social History   Tobacco Use   Smoking status: Light Smoker    Packs/day: 0.10    Types: Cigarettes   Smokeless tobacco: Never  Substance Use Topics   Alcohol use: Yes    Comment: occ   Drug use: Yes    Frequency: 7.0 times per week    Types: Marijuana    No Known Allergies   Outpatient Medications Prior to  Visit  Medication Sig Dispense Refill   cabotegravir & rilpivirine ER (CABENUVA) 400 & 600 MG/2ML injection Inject 1 kit into the muscle every 30 (thirty) days. 4 mL 5   sulfamethoxazole-trimethoprim (BACTRIM) 400-80 MG tablet Take 1 tablet by mouth daily. (Patient not taking: Reported on 10/30/2021) 30 tablet 5   No facility-administered medications prior to visit.           Objective: Vitals:   10/30/21 1548  BP: 137/83  Pulse: 97  Temp: 98.9 F (37.2 C)    Physical Exam Vitals reviewed.  Constitutional:      Appearance: He is well-developed.     Comments: Thin appearing but overall looks healthier than last visit  HENT:     Mouth/Throat:     Dentition: Normal dentition. No dental abscesses.  Cardiovascular:     Rate and Rhythm: Normal rate and regular rhythm.     Heart sounds: Normal heart sounds.  Pulmonary:     Effort: Pulmonary effort is normal.     Breath sounds: Normal breath sounds.  Abdominal:     General: There is no distension.     Palpations: Abdomen is soft.     Tenderness: There is no abdominal tenderness.  Lymphadenopathy:     Cervical: No cervical adenopathy.  Skin:  General: Skin is warm and dry.     Findings: Rash (hyperpigmented papular rash to the right side of his neck) present.  Neurological:     Mental Status: He is alert and oriented to person, place, and time.  Psychiatric:        Judgment: Judgment normal.     Comments: In good spirits today and engaged in care discussion.      Lab Results Lab Results  Component Value Date   WBC 4.1 08/06/2020   HGB 10.4 (L) 08/06/2020   HCT 32.6 (L) 08/06/2020   MCV 85.8 08/06/2020   PLT 105 (L) 08/06/2020    Lab Results  Component Value Date   CREATININE 0.93 08/06/2020   BUN 12 08/06/2020   NA 138 08/06/2020   K 3.4 (L) 08/06/2020   CL 102 08/06/2020   CO2 28 08/06/2020    Lab Results  Component Value Date   ALT 5 (L) 08/06/2020   AST 15 08/06/2020   BILITOT 0.4 08/06/2020    HIV 1  RNA Quant (Copies/mL)  Date Value  10/01/2021 77,200 (H)  06/11/2021 412,000 (H)  01/21/2021 85 (H)   CD4 T Cell Abs (/uL)  Date Value  06/11/2021 <35 (L)  08/06/2020 <35 (L)  02/26/2020 <35 (L)    Assessment and Plan: Problem List Items Addressed This Visit       High   AIDS (acquired immune deficiency syndrome) (HCC) (Chronic)    VL down to 77K from > 400K after 2 injections. Not sure how fast we should expect his viral load to drop but will check a genotype reflex today to see how we are doing. Would like to consider adding q92mSQ Sunlenca for him also to offer a full parenteral regimen - not quite available yet to give but hopefully soon.   HIV 1 RNA Quant (Copies/mL)  Date Value  10/01/2021 77,200 (H)  06/11/2021 412,000 (H)  01/21/2021 85 (H)   CD4 T Cell Abs (/uL)  Date Value  06/11/2021 <35 (L)  08/06/2020 <35 (L)  02/26/2020 <35 (L)   11/26/2021 is the date of his next injection.       [facility] ([facility])   Relevant Orders   HIV-1 Genotyping (RTI,PI,IN Inhbtr)     Unprioritized   Eczema    Encouraged him to start using the triamcinolone cream for his neck rashes.       Despite my preference, he refuses his bactrim.    SJanene Madeira MSN, NP-C [facility] [email or web address] Pager: [phone]: [place] [phone] 10/30/2021

## 2021-10-30 NOTE — Assessment & Plan Note (Addendum)
VL down to 77K from > 400K after 2 injections. Not sure how fast we should expect his viral load to drop but will check a genotype reflex today to see how we are doing. Would like to consider adding q25m SQ Sunlenca for him also to offer a full parenteral regimen - not quite available yet to give but hopefully soon.   HIV 1 RNA Quant (Copies/mL)  Date Value  10/01/2021 77,200 (H)  06/11/2021 412,000 (H)  01/21/2021 85 (H)   CD4 T Cell Abs (/uL)  Date Value  06/11/2021 <35 (L)  08/06/2020 <35 (L)  02/26/2020 <35 (L)   11/26/2021 is the date of his next injection.

## 2021-10-30 NOTE — Patient Instructions (Signed)
We gave you your injection of CABENUVA for treatment today.   Your next appointment has been scheduled in 1 month on 11/26/2021  Start using the cream I gave you on your neck to help with itching. Put it on 2 or 3 times a day until your rashes are better.

## 2021-11-09 LAB — HIV-1 GENOTYPING (RTI,PI,IN INHBTR)
Date Viral Load Collected: 77200
HIV-1 Genotype: DETECTED — AB

## 2021-11-10 ENCOUNTER — Other Ambulatory Visit: Payer: Self-pay

## 2021-11-10 ENCOUNTER — Telehealth: Payer: Self-pay | Admitting: Infectious Diseases

## 2021-11-10 DIAGNOSIS — B20 Human immunodeficiency virus [HIV] disease: Secondary | ICD-10-CM

## 2021-11-10 NOTE — Progress Notes (Signed)
The genotype you ordered doesn't have a RNA attached to it. I think it specifically has to say RNA with it. I always use HIV RNA, RTPCR W/R GT (RTI, PI,INT) for resistance testing.

## 2021-11-10 NOTE — Telephone Encounter (Signed)
I called to discuss with [place] mother and legal guardian to let her know that he has failed treatment with Cabenuva injections. I asked her to please join [place] at his next follow up visit on 11/25/2021 @ 9:30 am to discuss. I shared my concern with her that I don't think Uziel has the capacity to refuse treatment at this point and he has over and over told me he will not take any pills for his infection.   Cordelia Pen will join me at his appointment and will continue to speak with [name].

## 2021-11-10 NOTE — Progress Notes (Signed)
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   Will reach out to [place] and his parents to see if we can get a meeting together so we can talk about options going forward for oral treatment.

## 2021-11-10 NOTE — Progress Notes (Signed)
Will you please help to see if there is current integrase inhibitor genotype info? It should be on this but I don't see anything more current than 10/03/21

## 2021-11-11 ENCOUNTER — Other Ambulatory Visit: Payer: Self-pay

## 2021-11-11 ENCOUNTER — Other Ambulatory Visit: Payer: Medicaid Other

## 2021-11-11 DIAGNOSIS — B20 Human immunodeficiency virus [HIV] disease: Secondary | ICD-10-CM | POA: Diagnosis not present

## 2021-11-23 LAB — HIV-1 RNA QUANT-NO REFLEX-BLD
HIV 1 RNA Quant: 12100 Copies/mL — ABNORMAL HIGH
HIV-1 RNA Quant, Log: 4.08 Log cps/mL — ABNORMAL HIGH

## 2021-11-23 LAB — HIV-1 INTEGRASE GENOTYPE: Date Viral Load Collected: 2212023

## 2021-11-25 ENCOUNTER — Other Ambulatory Visit: Payer: Self-pay

## 2021-11-25 ENCOUNTER — Ambulatory Visit (INDEPENDENT_AMBULATORY_CARE_PROVIDER_SITE_OTHER): Payer: Medicaid Other | Admitting: Infectious Diseases

## 2021-11-25 ENCOUNTER — Other Ambulatory Visit (HOSPITAL_COMMUNITY): Payer: Self-pay

## 2021-11-25 ENCOUNTER — Telehealth: Payer: Self-pay | Admitting: Pharmacist

## 2021-11-25 VITALS — BP 122/76 | HR 76 | Resp 16 | Ht 63.0 in | Wt 108.8 lb

## 2021-11-25 DIAGNOSIS — L2082 Flexural eczema: Secondary | ICD-10-CM | POA: Diagnosis not present

## 2021-11-25 DIAGNOSIS — L03811 Cellulitis of head [any part, except face]: Secondary | ICD-10-CM | POA: Diagnosis not present

## 2021-11-25 DIAGNOSIS — L02811 Cutaneous abscess of head [any part, except face]: Secondary | ICD-10-CM

## 2021-11-25 DIAGNOSIS — B20 Human immunodeficiency virus [HIV] disease: Secondary | ICD-10-CM | POA: Diagnosis not present

## 2021-11-25 MED ORDER — CEFADROXIL 500 MG PO CAPS
500.0000 mg | ORAL_CAPSULE | Freq: Two times a day (BID) | ORAL | 0 refills | Status: AC
  Filled 2021-11-25 – 2021-12-01 (×2): qty 14, 7d supply, fill #0

## 2021-11-25 MED ORDER — PREZCOBIX 800-150 MG PO TABS
1.0000 | ORAL_TABLET | Freq: Every day | ORAL | 11 refills | Status: DC
  Filled 2021-11-25 – 2021-12-01 (×4): qty 30, 30d supply, fill #0
  Filled 2021-12-18: qty 30, 30d supply, fill #1
  Filled 2022-01-14: qty 30, 30d supply, fill #2
  Filled 2022-02-18: qty 30, 30d supply, fill #3
  Filled 2022-03-17: qty 30, 30d supply, fill #4
  Filled 2022-04-15: qty 30, 30d supply, fill #5
  Filled 2022-05-11: qty 30, 30d supply, fill #6
  Filled 2022-06-04: qty 30, 30d supply, fill #7
  Filled 2022-06-30: qty 30, 30d supply, fill #8
  Filled 2022-07-29: qty 30, 30d supply, fill #9
  Filled 2022-08-24 – 2022-08-28 (×2): qty 30, 30d supply, fill #10
  Filled 2022-09-28: qty 30, 30d supply, fill #11

## 2021-11-25 MED ORDER — TIVICAY 50 MG PO TABS
50.0000 mg | ORAL_TABLET | Freq: Every day | ORAL | 11 refills | Status: DC
  Filled 2021-11-25 – 2021-12-01 (×4): qty 30, 30d supply, fill #0
  Filled 2022-01-05: qty 30, 30d supply, fill #1
  Filled 2022-01-15: qty 30, 30d supply, fill #2
  Filled 2022-02-18: qty 30, 30d supply, fill #3
  Filled 2022-03-17: qty 30, 30d supply, fill #4
  Filled 2022-04-15: qty 30, 30d supply, fill #5
  Filled 2022-05-11: qty 30, 30d supply, fill #6
  Filled 2022-06-04: qty 30, 30d supply, fill #7
  Filled 2022-06-30: qty 30, 30d supply, fill #8
  Filled 2022-07-29: qty 30, 30d supply, fill #9
  Filled 2022-08-24 – 2022-08-28 (×2): qty 30, 30d supply, fill #10
  Filled 2022-09-28: qty 30, 30d supply, fill #11

## 2021-11-25 NOTE — Assessment & Plan Note (Signed)
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 See cumulative genotype for details. 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 We had a long discussion with he and his mother today for a plan going forward. Explained we have tried to give Aaron Lee attempts at independence for his own medical management over the last 5 years but he obviously cannot sustain this on his own. The plan going forward will be for Aaron Lee to help administer pills daily. Pill tray provided today.  ? ?RTC in 6 weeks to check in on adherence and viral load monitoring.  ?

## 2021-11-25 NOTE — Progress Notes (Signed)
u   ? ?  ?Name: Aaron Lee  ?DOB: [birth year]  ?MRN: [id number]  ?PCP: Patient, No Pcp Per (Inactive)  ? ? ? ?SUBJECTIVE: ?Aaron Lee is a 31 y.o. male with HIV and AIDS in November 2018 with CD4 nadir < 10, VL 192,000 copies.  ?HIV Risk: bisexual.  ?OI Hx: shingles ? ?Previous Regimens:  ?Biktarvy --> suppressed (changed to PI for adherence concern) ?Symtuza 2020 ?Cabenuva Dec 2022 --> failed with Y181C  ? ?Genotype:  ?07/2017 - K103S (R-nevirapine) ?05-2019 - K103S, no integrase mutations  ?10/2019 - K103S, no integrase mutations.  ?10/2021 - K70KT, K103S, V106VAIT, Y181C, R211K (see phone note for cumulative genotype) ? ? ? ?Chief Complaint  ?Patient presents with  ? Follow-up  ?  Per last note failed treatment -no Cabenuva in fridge for today.   ? ? ? ?HPI: ?Aaron Lee is here with his mother for follow up. Unfortunately we did not see his viral load drop down significantly after 3 doses of cabenuva and re-drew a genotype which determined that he has developed multiple NRTI/NNRTI mutations.  ? ?He says he is willing to see about a "new" pill for his treatment with the help of his dad. He is open to dad giving him his medication everyday.  ? ? ?Review of Systems  ?Constitutional:  Negative for chills and fever.  ?HENT:  Negative for sore throat.   ?Respiratory:  Negative for cough.   ?Cardiovascular:  Negative for chest pain and leg swelling.  ?Gastrointestinal:  Negative for abdominal pain, diarrhea and vomiting.  ?Genitourinary:  Negative for dysuria and flank pain.  ?Musculoskeletal:  Negative for myalgias and neck pain.  ?Skin:  Positive for rash and wound (right ear lobe).  ?Neurological:  Negative for dizziness and headaches.  ?Psychiatric/Behavioral:  The patient is not nervous/anxious.   ? ? ?Past Medical History:  ?Diagnosis Date  ? Eczema   ? HIV (human immunodeficiency virus infection) ([place])   ? Shingles outbreak 09/26/2018  ? ? ?Social History  ? ?Tobacco Use  ? Smoking status: Light Smoker  ?   Packs/day: 0.10  ?  Types: Cigarettes  ? Smokeless tobacco: Never  ?Substance Use Topics  ? Alcohol use: Yes  ?  Comment: occ  ? Drug use: Yes  ?  Frequency: 7.0 times per week  ?  Types: Marijuana  ? ? ?No Known Allergies  ? ?Outpatient Medications Prior to Visit  ?Medication Sig Dispense Refill  ? sulfamethoxazole-trimethoprim (BACTRIM) 400-80 MG tablet Take 1 tablet by mouth daily. (Patient not taking: Reported on 10/30/2021) 30 tablet 5  ? cabotegravir & rilpivirine ER (CABENUVA) 400 & 600 MG/2ML injection Inject 1 kit into the muscle every 30 (thirty) days. (Patient not taking: Reported on 11/25/2021) 4 mL 5  ? ?No facility-administered medications prior to visit.  ? ?        ?Objective: ?Vitals:  ? 11/25/21 0918  ?BP: 122/76  ?Pulse: 76  ?Resp: 16  ?SpO2: 100%  ? ? ?Physical Exam ?Vitals reviewed.  ?Constitutional:   ?   Appearance: He is well-developed.  ?   Comments: Thin appearing  ?HENT:  ?   Ears:  ?   Comments: Rt earlobe with eczematous rash complicated by induration and mild discomfort.  ?   Mouth/Throat:  ?   Dentition: Normal dentition. No dental abscesses.  ?Cardiovascular:  ?   Rate and Rhythm: Normal rate and regular rhythm.  ?   Heart sounds: Normal heart sounds.  ?Pulmonary:  ?  Effort: Pulmonary effort is normal.  ?   Breath sounds: Normal breath sounds.  ?Abdominal:  ?   General: There is no distension.  ?   Palpations: Abdomen is soft.  ?   Tenderness: There is no abdominal tenderness.  ?Lymphadenopathy:  ?   Cervical: No cervical adenopathy.  ?Skin: ?   General: Skin is warm and dry.  ?   Findings: Rash (hyperpigmented papular rash to the right side of his neck) present.  ?Neurological:  ?   Mental Status: He is alert and oriented to person, place, and time.  ?Psychiatric:     ?   Judgment: Judgment normal.  ? ? ? ?Lab Results ?Lab Results  ?Component Value Date  ? WBC 4.1 08/06/2020  ? HGB 10.4 (L) 08/06/2020  ? HCT 32.6 (L) 08/06/2020  ? MCV 85.8 08/06/2020  ? PLT 105 (L) 08/06/2020  ?  ?Lab  Results  ?Component Value Date  ? CREATININE 0.93 08/06/2020  ? BUN 12 08/06/2020  ? NA 138 08/06/2020  ? K 3.4 (L) 08/06/2020  ? CL 102 08/06/2020  ? CO2 28 08/06/2020  ?  ?Lab Results  ?Component Value Date  ? ALT 5 (L) 08/06/2020  ? AST 15 08/06/2020  ? BILITOT 0.4 08/06/2020  ?  ?HIV 1 RNA Quant (Copies/mL)  ?Date Value  ?11/11/2021 12,100 (H)  ?10/01/2021 77,200 (H)  ?06/11/2021 412,000 (H)  ? ?CD4 T Cell Abs (/uL)  ?Date Value  ?06/11/2021 <35 (L)  ?08/06/2020 <35 (L)  ?02/26/2020 <35 (L)  ? ? ?Assessment and Plan: ?Problem List Items Addressed This Visit   ? ?  ? High  ? AIDS (acquired immune deficiency syndrome) (HCC) (Chronic)  ?  Continue bactrim 1 DS tab daily for PJP prophylaxis. Rash on neck more c/w eczema not acute zoster.  ?Abscess to earlobe on the right - tx with cefadroxil 500 mg BID x 7d. Warm compresses.  ?  ?  ? Relevant Medications  ? dolutegravir (TIVICAY) 50 MG tablet  ? darunavir-cobicistat (PREZCOBIX) 800-150 MG tablet  ? cefadroxil (DURICEF) 500 MG capsule  ? HIV disease ([place]) - Primary (Chronic)  ?  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 See cumulative genotype for details. 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 We had a long discussion with he and his mother today for a plan going forward. Explained we have tried to give Aaron Lee attempts at independence for his own medical management over the last 5 years but he obviously cannot sustain this on his own. The plan going forward will be for dad to help administer pills daily. Pill tray provided today.  ? ?RTC in 6 weeks to check in on adherence and viral load monitoring.  ?  ?  ? Relevant Medications  ? dolutegravir (TIVICAY) 50 MG tablet  ? darunavir-cobicistat (PREZCOBIX) 800-150 MG tablet  ? cefadroxil (DURICEF) 500 MG capsule  ?  ? Unprioritized  ? Eczema  ?  Topical triamcinolone lotion,  BID OTC antihistamines recommended.  ?Treat secondary bacterial infection of Rt earlobe today.  ?  ?  ? ?Other Visit Diagnoses   ? ? Cellulitis and abscess of head      ? Relevant Medications  ? dolutegravir (TIVICAY) 50 MG tablet  ? darunavir-cobicistat (PREZCOBIX) 800-150 MG tablet  ? cefadroxil (DURICEF) 500 MG capsule  ? ?  ? ? ?Janene Madeira, MSN, NP-C ?[facility] ?[facility]  ?[email or web address] ?Pager: [phone] ?Office: [phone] ?[facility] Main Line: [phone] ? ?11/25/2021  ?

## 2021-11-25 NOTE — Assessment & Plan Note (Signed)
Continue bactrim 1 DS tab daily for PJP prophylaxis. Rash on neck more c/w eczema not acute zoster.  ?Abscess to earlobe on the right - tx with cefadroxil 500 mg BID x 7d. Warm compresses.  ?

## 2021-11-25 NOTE — Telephone Encounter (Signed)
Cumulative HIV Genotype Data ? ?RT Mutations  K70KT, K103S, V106VAIT, Y181C, R211K  ?PI Mutations  none  ?Integrase Mutations  none  ? ?Interpretation of Genotype Data per [employer] HIV Drug Resistance Database: ? ?Nucleoside RTIs  ?Abacavir - low level resistance ?Zidovudine - susceptible ?Emtricitabine - potential low level resistance ?Lamivudine - potential low level resistance ?Tenofovir - low level resistance  ? ?Non-Nucleoside RTIs  ?Doravirine - high level resistance ?Efavirenz - high level resistance ?Etravirine - intermediate resistance ?Nevirapine - high level resistance ?Rilpivirine - high level resistance  ? ?Protease Inhibitors  ?Atazanavir - susceptible ?Darunavir - susceptible ?Lopinavir - susceptible  ? ?Integrase Inhibitors  ?Bictegravir - susceptible ?Cabotegravir - susceptible ?Dolutegravir - susceptible ?Elvitegravir - susceptible ?Raltegravir - susceptible  ? ?Cason Dabney L. Raeonna Milo, PharmD, BCIDP, AAHIVP, CPP ?Clinical Pharmacist Practitioner ?Infectious Diseases Clinical Pharmacist ?[facility] ?11/25/2021, 9:26 AM ? ?

## 2021-11-25 NOTE — Patient Instructions (Addendum)
For your treatment: ? ?Start:  ?TIVICAY (small yellow circle tab) taken together with  ?PREZCOBIX (pink oval tablet) WITH FOOD  ? ?CONTINUE Bactrim antibiotic (the pneumonia pill)  ? ?Use a pill tray everyday to make sure you get all your doses in. Have your dad help you take this everyday.  ? ?Start the itching cream on the itchy rashes. (Triamcinolone)  ?To help further with the itching - consider starting a twice a day over the counter antihistamine (Zyrtec, Allegra, Claritin)  ? ?I worry your earlobe has an infection - START the antibiotic CEFADROXIL twice a day for a week. Warm compresses to help with the abscess pain and to get it to open up.  ? ? ?Please come back to see me in 6 weeks - will repeat your blood work then to make sure your pills are working.  ?

## 2021-11-25 NOTE — Assessment & Plan Note (Signed)
Topical triamcinolone lotion, BID OTC antihistamines recommended.  ?Treat secondary bacterial infection of Rt earlobe today.  ?

## 2021-11-26 ENCOUNTER — Other Ambulatory Visit: Payer: Self-pay

## 2021-11-26 ENCOUNTER — Encounter: Payer: Medicaid Other | Admitting: Infectious Diseases

## 2021-11-27 ENCOUNTER — Encounter: Payer: Medicaid Other | Admitting: Infectious Diseases

## 2021-12-01 ENCOUNTER — Other Ambulatory Visit (HOSPITAL_COMMUNITY): Payer: Self-pay

## 2021-12-03 ENCOUNTER — Telehealth: Payer: Self-pay

## 2021-12-03 NOTE — Telephone Encounter (Signed)
He can take everything together if he prefers. If he gets an upset stomach, he can take the Bactrim separate. But yes, definitely take the Tivicay and Prezcobix together. Otherwise, it does not matter as there are no drug interactions. Thank you!

## 2021-12-03 NOTE — Telephone Encounter (Signed)
Patient's father called with question regarding timing of medications. Wants to know if the patient can take all medications at the same time, or if he needs to take either antibiotic separately from the patient's HIV regimen. Confirmed with father that the medications were Tivicay, Prezcobix, cefadroxil, and Bactrim.  ? ?Advised that Tivicay and Prezcobix should be taken together. Requested pharmacy to please advise with recommendations for timing on other medications. ?

## 2021-12-03 NOTE — Telephone Encounter (Signed)
Called patient's father and communicated pharmacy recommendations, advising that it is ok for the patient to take all his pills at the same time if preferred. Advised father that if the patient experiences nausea, he can take the Bactrim separately. Jeneen Rinks stated understanding and had no further questions. ? ?Binnie Kand, RN  ?

## 2021-12-18 ENCOUNTER — Other Ambulatory Visit (HOSPITAL_COMMUNITY): Payer: Self-pay

## 2021-12-25 ENCOUNTER — Other Ambulatory Visit (HOSPITAL_COMMUNITY): Payer: Self-pay

## 2021-12-30 ENCOUNTER — Encounter: Payer: Self-pay | Admitting: Infectious Diseases

## 2021-12-30 ENCOUNTER — Other Ambulatory Visit (HOSPITAL_COMMUNITY): Payer: Self-pay

## 2021-12-30 ENCOUNTER — Other Ambulatory Visit: Payer: Self-pay

## 2021-12-30 ENCOUNTER — Ambulatory Visit (INDEPENDENT_AMBULATORY_CARE_PROVIDER_SITE_OTHER): Payer: Medicaid Other | Admitting: Infectious Diseases

## 2021-12-30 VITALS — BP 120/70 | HR 94 | Temp 97.3°F | Wt 109.0 lb

## 2021-12-30 DIAGNOSIS — B20 Human immunodeficiency virus [HIV] disease: Secondary | ICD-10-CM

## 2021-12-30 DIAGNOSIS — Z91148 Patient's other noncompliance with medication regimen for other reason: Secondary | ICD-10-CM | POA: Diagnosis not present

## 2021-12-30 NOTE — Assessment & Plan Note (Signed)
Seems he has been adherent to regimen of Symtuza + Tivicay taken together once a day. I was happy to hear he brought up to me that he only received one of his refills - I checked with pharmacy team and tivicay will ship out tomorrow. Requested for May shipments to send both of his meds at once.  ?At this point I am not worried about the bactrim - he knows I recommend taking it and won't consistently.  ?VL today to monitor for reduction in viral load after a month on medications.  ?RTC in 3 m for regular care and monitoring.  ?

## 2021-12-30 NOTE — Patient Instructions (Addendum)
?  Your should be getting a shipment of your tivicay tomorrow. See if you or your mom or dad can call the [facility] to make sure [place]  ?[phone] ?We are going to see if they can help get all of your medications sent together for the shipment in May.  ? ?Will call you Monday with your lab results  ? ?

## 2021-12-30 NOTE — Progress Notes (Signed)
u   ? ?  ?Name: Aaron Lee  ?DOB: [birth year]  ?MRN: [id number]  ?PCP: Patient, No Pcp Per (Inactive)  ? ? ? ?SUBJECTIVE: ?Aaron Lee is a 31 y.o. male with HIV and AIDS in November 2018 with CD4 nadir < 10, VL 192,000 copies.  ?HIV Risk: bisexual.  ?OI Hx: shingles ? ?Previous Regimens:  ?Biktarvy --> suppressed (changed to PI for adherence concern) ?Symtuza 2020 ?Cabenuva Dec 2022 --> failed with Y181C  ? ?Genotype:  ?07/2017 - K103S (R-nevirapine) ?05-2019 - K103S, no integrase mutations  ?10/2019 - K103S, no integrase mutations.  ?10/2021 - K70KT, K103S, V106VAIT, Y181C, R211K (see phone note for cumulative genotype) ? ? ? ?Chief Complaint  ?Patient presents with  ? Follow-up  ? ? ? ?HPI: ?Aaron Lee is here for follow up after starting back on PO treatment with Symtuza and Tivicay now and has done well over the last 6 weeks. He expresses concern today that he received a refill of symtuza but not the tivicay. He is not taking the bactrim.  ?He has not had any side effects to these medications. His father helps with his administration everyday.  ? ? ?Review of Systems  ?Constitutional:  Negative for chills and fever.  ?HENT:  Negative for sore throat.   ?     No dental problems  ?Respiratory:  Negative for cough.   ?Cardiovascular:  Negative for chest pain and leg swelling.  ?Gastrointestinal:  Negative for abdominal pain, diarrhea and vomiting.  ?Genitourinary:  Negative for dysuria and flank pain.  ?Musculoskeletal:  Negative for myalgias and neck pain.  ?Skin:  Negative for rash.  ?Neurological:  Negative for dizziness and headaches.  ?Psychiatric/Behavioral:  The patient is not nervous/anxious.   ? ? ?Past Medical History:  ?Diagnosis Date  ? Eczema   ? HIV (human immunodeficiency virus infection) ([place])   ? Shingles outbreak 09/26/2018  ? ? ?Social History  ? ?Tobacco Use  ? Smoking status: Light Smoker  ?  Packs/day: 0.10  ?  Types: Cigarettes  ? Smokeless tobacco: Never  ?Substance Use Topics  ? Alcohol  use: Yes  ?  Comment: occ  ? Drug use: Yes  ?  Frequency: 7.0 times per week  ?  Types: Marijuana  ? ? ?No Known Allergies  ? ?Outpatient Medications Prior to Visit  ?Medication Sig Dispense Refill  ? darunavir-cobicistat (PREZCOBIX) 800-150 MG tablet Take 1 tablet by mouth daily. 30 tablet 11  ? dolutegravir (TIVICAY) 50 MG tablet Take 1 tablet (50 mg total) by mouth daily. 30 tablet 11  ? sulfamethoxazole-trimethoprim (BACTRIM) 400-80 MG tablet Take 1 tablet by mouth daily. (Patient not taking: Reported on 10/30/2021) 30 tablet 5  ? ?No facility-administered medications prior to visit.  ? ?        ?Objective: ?Vitals:  ? 12/30/21 1551  ?BP: 120/70  ?Pulse: 94  ?Temp: (!) 97.3 ?F (36.3 ?C)  ?SpO2: 100%  ? ? ?Physical Exam ?Constitutional:   ?   Appearance: Normal appearance. He is not ill-appearing.  ?HENT:  ?   Head: Normocephalic.  ?   Mouth/Throat:  ?   Mouth: Mucous membranes are moist.  ?   Pharynx: Oropharynx is clear.  ?Eyes:  ?   General: No scleral icterus. ?Pulmonary:  ?   Effort: Pulmonary effort is normal.  ?Musculoskeletal:     ?   General: Normal range of motion.  ?   Cervical back: Normal range of motion.  ?Skin: ?   Coloration:  Skin is not jaundiced or pale.  ?   Comments: Still with pruritic rash on right side of neck. Aaron Lee looks better after antibiotics 42mago.   ?Neurological:  ?   Mental Status: He is alert and oriented to person, place, and time.  ?Psychiatric:     ?   Mood and Affect: Mood normal.     ?   Judgment: Judgment normal.  ? ? ? ?Lab Results ?Lab Results  ?Component Value Date  ? WBC 4.1 08/06/2020  ? HGB 10.4 (L) 08/06/2020  ? HCT 32.6 (L) 08/06/2020  ? MCV 85.8 08/06/2020  ? PLT 105 (L) 08/06/2020  ?  ?Lab Results  ?Component Value Date  ? CREATININE 0.93 08/06/2020  ? BUN 12 08/06/2020  ? NA 138 08/06/2020  ? K 3.4 (L) 08/06/2020  ? CL 102 08/06/2020  ? CO2 28 08/06/2020  ?  ?Lab Results  ?Component Value Date  ? ALT 5 (L) 08/06/2020  ? AST 15 08/06/2020  ? BILITOT 0.4 08/06/2020   ?  ?HIV 1 RNA Quant (Copies/mL)  ?Date Value  ?11/11/2021 12,100 (H)  ?10/01/2021 77,200 (H)  ?06/11/2021 412,000 (H)  ? ?CD4 T Cell Abs (/uL)  ?Date Value  ?06/11/2021 <35 (L)  ?08/06/2020 <35 (L)  ?02/26/2020 <35 (L)  ? ? ?Assessment and Plan: ?Problem List Items Addressed This Visit   ? ?  ? High  ? HIV disease ([place] (Chronic)  ? Relevant Orders  ? HIV 1 RNA quant-no reflex-bld  ? AIDS (acquired immune deficiency syndrome) ([place] (Chronic)  ?  Seems he has been adherent to regimen of Symtuza + Tivicay taken together once a day. I was happy to hear he brought up to me that he only received one of his refills - I checked with pharmacy team and tivicay will ship out tomorrow. Requested for May shipments to send both of his meds at once.  ?At this point I am not worried about the bactrim - he knows I recommend taking it and won't consistently.  ?VL today to monitor for reduction in viral load after a month on medications.  ?RTC in 3 m for regular care and monitoring.  ?  ?  ?  ? Unprioritized  ? Noncompliance with medication regimen  ?  Partnering with his father for adherence support. Also working with [facility] - [place].  ?  ?  ? ?SJanene Madeira MSN, NP-C ?[facility] Infectious Disease ?[facility]  [email or web address] ?Pager: [phone]?Office: [phone]?[facility] Main Line: [phone]? ?12/30/2021  ?

## 2021-12-30 NOTE — Assessment & Plan Note (Signed)
Partnering with his father for adherence support. Also working with [facility] - met with [place] today for regular contact/support.  ?

## 2022-01-03 LAB — HIV-1 RNA QUANT-NO REFLEX-BLD
HIV 1 RNA Quant: 161 copies/mL — ABNORMAL HIGH
HIV-1 RNA Quant, Log: 2.21 Log copies/mL — ABNORMAL HIGH

## 2022-01-05 ENCOUNTER — Other Ambulatory Visit (HOSPITAL_COMMUNITY): Payer: Self-pay

## 2022-01-05 ENCOUNTER — Telehealth: Payer: Self-pay | Admitting: Infectious Diseases

## 2022-01-05 NOTE — Telephone Encounter (Signed)
Called to speak with Aaron Lee and his father, Aaron Lee regarding lab results. VL down to 161 after focusing good efforts on adherence with dad's support. He is out of the [place] and has been only taking the prezcobix and bactrim.  ? ?Spoke with Aaron Lee and [name] will ship this out today. Will try to get future shipments to include both pills together to help with adherence.  ? ?I counseled that he should always take these together only and never alone.  ?They verbalized understanding.  ? ?Support and encouragement to continue with adherence given! ?

## 2022-01-14 ENCOUNTER — Other Ambulatory Visit (HOSPITAL_COMMUNITY): Payer: Self-pay

## 2022-01-15 ENCOUNTER — Other Ambulatory Visit (HOSPITAL_COMMUNITY): Payer: Self-pay

## 2022-01-27 ENCOUNTER — Other Ambulatory Visit (HOSPITAL_COMMUNITY): Payer: Self-pay

## 2022-01-27 ENCOUNTER — Encounter: Payer: Medicaid Other | Admitting: Infectious Diseases

## 2022-01-28 ENCOUNTER — Other Ambulatory Visit (HOSPITAL_COMMUNITY): Payer: Self-pay

## 2022-02-18 ENCOUNTER — Other Ambulatory Visit (HOSPITAL_COMMUNITY): Payer: Self-pay

## 2022-02-24 ENCOUNTER — Other Ambulatory Visit (HOSPITAL_COMMUNITY): Payer: Self-pay

## 2022-03-16 ENCOUNTER — Other Ambulatory Visit (HOSPITAL_COMMUNITY): Payer: Self-pay

## 2022-03-17 ENCOUNTER — Other Ambulatory Visit (HOSPITAL_COMMUNITY): Payer: Self-pay

## 2022-03-20 ENCOUNTER — Other Ambulatory Visit (HOSPITAL_COMMUNITY): Payer: Self-pay

## 2022-03-27 ENCOUNTER — Other Ambulatory Visit (HOSPITAL_COMMUNITY): Payer: Self-pay

## 2022-04-14 ENCOUNTER — Encounter: Payer: Self-pay | Admitting: Infectious Diseases

## 2022-04-14 ENCOUNTER — Other Ambulatory Visit: Payer: Self-pay

## 2022-04-14 ENCOUNTER — Other Ambulatory Visit (HOSPITAL_COMMUNITY): Payer: Self-pay

## 2022-04-14 ENCOUNTER — Ambulatory Visit (INDEPENDENT_AMBULATORY_CARE_PROVIDER_SITE_OTHER): Payer: Medicaid Other | Admitting: Infectious Diseases

## 2022-04-14 VITALS — BP 118/81 | HR 79 | Resp 16 | Ht 63.0 in | Wt 106.0 lb

## 2022-04-14 DIAGNOSIS — Z8619 Personal history of other infectious and parasitic diseases: Secondary | ICD-10-CM | POA: Diagnosis not present

## 2022-04-14 DIAGNOSIS — B20 Human immunodeficiency virus [HIV] disease: Secondary | ICD-10-CM | POA: Diagnosis not present

## 2022-04-14 MED ORDER — SULFAMETHOXAZOLE-TRIMETHOPRIM 400-80 MG PO TABS
1.0000 | ORAL_TABLET | Freq: Every day | ORAL | 11 refills | Status: DC
  Filled 2022-04-14: qty 30, 30d supply, fill #0
  Filled 2022-05-11: qty 30, 30d supply, fill #1
  Filled 2022-06-04: qty 30, 30d supply, fill #2
  Filled 2022-06-30: qty 30, 30d supply, fill #3
  Filled 2022-07-29: qty 30, 30d supply, fill #4
  Filled 2022-08-24 – 2022-08-28 (×2): qty 30, 30d supply, fill #5
  Filled 2022-09-28: qty 30, 30d supply, fill #6
  Filled 2022-10-21: qty 30, 30d supply, fill #7
  Filled 2022-11-19: qty 30, 30d supply, fill #8
  Filled 2022-12-17: qty 30, 30d supply, fill #9
  Filled 2023-01-18: qty 30, 30d supply, fill #10
  Filled 2023-02-11: qty 30, 30d supply, fill #11

## 2022-04-14 NOTE — Progress Notes (Signed)
u      Name: Aaron Lee  DOB: 06/22/1991  MRN: [id number]  PCP: Patient, No Pcp Per     SUBJECTIVE: Aaron Lee is a 31 y.o. male with HIV and AIDS in November 2018 with CD4 nadir < 10, VL 192,000 copies.  HIV Risk: MSM/sexual   OI Hx: shingles  Previous Regimens:  Biktarvy --> suppressed (changed to PI for adherence concern) Symtuza 2020 [facility] Dec 2022 --> failed with Y181C   Genotype:  07/2017 - K103S (R-nevirapine) 05-2019 - K103S, no integrase mutations  10/2019 - K103S, no integrase mutations.  10/2021 - K70KT, K103S, V106VAIT, Y181C, R211K (see phone note for cumulative genotype)    Chief Complaint  Patient presents with   Follow-up     HPI: Aaron Lee is here for 67m follow up. He has noticed a good improvement in his appetite and skin has not been a problem - no rashes since last OV.  He continues to take his Prezcobix + Tivicay and bactrim daily. His father gives these to him after work to help with adherence. He has not had his pills today yet but will see his father soon. He tells me he needs a refill of his Pneumonia pill also (Bactrim).   Not sexually active. Has not been to see a dentist since he was a child but interested in cleanings. Has some gingivitis and bleeding gums sometimes. Working with [facility] as well.     Review of Systems  Constitutional:  Negative for chills and fever.  HENT:  Negative for sore throat.        Bleeding gums   Respiratory:  Negative for cough.   Cardiovascular:  Negative for chest pain and leg swelling.  Gastrointestinal:  Negative for abdominal pain, diarrhea and vomiting.  Genitourinary:  Negative for dysuria and flank pain.  Musculoskeletal:  Negative for myalgias and neck pain.  Skin:  Negative for rash.  Neurological:  Negative for dizziness and headaches.  Psychiatric/Behavioral:  The patient is not nervous/anxious.      Past Medical History:  Diagnosis Date   Eczema    HIV (human immunodeficiency virus  infection) (HCC)    Shingles outbreak 09/26/2018    Social History   Tobacco Use   Smoking status: Light Smoker    Packs/day: 0.10    Types: Cigarettes   Smokeless tobacco: Never  Substance Use Topics   Alcohol use: Yes    Comment: occ   Drug use: Yes    Frequency: 7.0 times per week    Types: Marijuana    No Known Allergies   Outpatient Medications Prior to Visit  Medication Sig Dispense Refill   darunavir-cobicistat (PREZCOBIX) 800-150 MG tablet Take 1 tablet by mouth daily. 30 tablet 11   dolutegravir (TIVICAY) 50 MG tablet Take 1 tablet (50 mg total) by mouth daily. 30 tablet 11   sulfamethoxazole-trimethoprim (BACTRIM) 400-80 MG tablet Take 1 tablet by mouth daily. (Patient not taking: Reported on 10/30/2021) 30 tablet 5   No facility-administered medications prior to visit.           Objective: Vitals:   04/14/22 1549  BP: 118/81  Pulse: 79  Resp: 16  SpO2: 100%   Wt Readings from Last 3 Encounters:  04/14/22 106 lb (48.1 kg)  12/30/21 109 lb (49.4 kg)  11/25/21 108 lb 12.8 oz (49.4 kg)    Physical Exam Constitutional:      Appearance: Normal appearance. He is not ill-appearing.  HENT:  Head: Normocephalic.     Mouth/Throat:     Mouth: Mucous membranes are moist.     Pharynx: Oropharynx is clear.  Eyes:     General: No scleral icterus. Cardiovascular:     Rate and Rhythm: Normal rate and regular rhythm.  Pulmonary:     Effort: Pulmonary effort is normal.  Musculoskeletal:        General: Normal range of motion.     Cervical back: Normal range of motion.  Skin:    Coloration: Skin is not jaundiced or pale.     Comments: Skin is the best I have seen in a long time - no rashes or abscesses anywhere.   Neurological:     Mental Status: He is alert and oriented to person, place, and time.  Psychiatric:        Mood and Affect: Mood normal.        Judgment: Judgment normal.      Lab Results Lab Results  Component Value Date   WBC 4.1 08/06/2020    HGB 10.4 (L) 08/06/2020   HCT 32.6 (L) 08/06/2020   MCV 85.8 08/06/2020   PLT 105 (L) 08/06/2020    Lab Results  Component Value Date   CREATININE 0.93 08/06/2020   BUN 12 08/06/2020   NA 138 08/06/2020   K 3.4 (L) 08/06/2020   CL 102 08/06/2020   CO2 28 08/06/2020    Lab Results  Component Value Date   ALT 5 (L) 08/06/2020   AST 15 08/06/2020   BILITOT 0.4 08/06/2020    HIV 1 RNA Quant  Date Value  12/30/2021 161 copies/mL (H)  11/11/2021 12,100 Copies/mL (H)  10/01/2021 77,200 Copies/mL (H)   CD4 T Cell Abs (/uL)  Date Value  06/11/2021 <35 (L)  08/06/2020 <35 (L)  02/26/2020 <35 (L)    Assessment and Plan: Problem List Items Addressed This Visit       High   HIV disease (HCC) (Chronic)    Nice reduction in viral load to 161 copies 3 months ago. With his father's help he has done much better regarding daily adherence. We reviewed that this is the lowest his levels have been in years, since living with his sister. He has started to notice some good clinical signs of improvement as well and most notably his eczema has not flared up at all.  Will arrange dental visit today for routine cleanings - has significant gingivitis and does not provide regular oral care at home.  VL / CD4 today  CMP today  Continue Prezcobix + Tivicay + Bactrim together once daily with food.   FU in 3 months       Relevant Medications   sulfamethoxazole-trimethoprim (BACTRIM) 400-80 MG tablet   AIDS (acquired immune deficiency syndrome) (HCC) - Primary (Chronic)    No concerns today suggestive of associated opportunistic infection or advancing HIV disease such as fevers, night sweats, weight loss, anorexia, cough, SOB, nausea, vomiting, diarrhea, headache, sensory changes, lymphadenopathy or oral thrush.   Continue Bactrim - follow CD4 count.       Relevant Medications   sulfamethoxazole-trimethoprim (BACTRIM) 400-80 MG tablet   Other Relevant Orders   HIV 1 RNA quant-no  reflex-bld   T-helper cells (CD4) count   COMPLETE METABOLIC PANEL WITH GFR     Unprioritized   History of syphilis    Recheck RPR today to ensure no new infection.       Relevant Orders   RPR    Judeth Cornfield  Durwin Nora, MSN, NP-C [facility] for Infectious Disease [facility]  [email or web address] Pager: [phone] Office: [phone] [facility] Main Line: [phone]  04/14/2022

## 2022-04-14 NOTE — Patient Instructions (Addendum)
CONTINUE your Prezcobix (large pink pill), Tivicay (small yellow pill) and Bactrim (white oval pill) once a day with food all together.   Stop by the lab on your way out.   I look forward to calling you with your lab results.

## 2022-04-14 NOTE — Assessment & Plan Note (Signed)
Recheck RPR today to ensure no new infection.

## 2022-04-14 NOTE — Assessment & Plan Note (Addendum)
Nice reduction in viral load to 161 copies 3 months ago. With his father's help he has done much better regarding daily adherence. We reviewed that this is the lowest his levels have been in years, since living with his sister. He has started to notice some good clinical signs of improvement as well and most notably his eczema has not flared up at all.  Will arrange dental visit today for routine cleanings - has significant gingivitis and does not provide regular oral care at home.  VL / CD4 today  CMP today  Continue Prezcobix + Tivicay + Bactrim together once daily with food.   FU in 3 months

## 2022-04-14 NOTE — Assessment & Plan Note (Signed)
No concerns today suggestive of associated opportunistic infection or advancing HIV disease such as fevers, night sweats, weight loss, anorexia, cough, SOB, nausea, vomiting, diarrhea, headache, sensory changes, lymphadenopathy or oral thrush.   Continue Bactrim - follow CD4 count.

## 2022-04-15 ENCOUNTER — Other Ambulatory Visit (HOSPITAL_COMMUNITY): Payer: Self-pay

## 2022-04-15 LAB — T-HELPER CELLS (CD4) COUNT (NOT AT ARMC)
CD4 % Helper T Cell: 11 % — ABNORMAL LOW (ref 33–65)
CD4 T Cell Abs: 88 /uL — ABNORMAL LOW (ref 400–1790)

## 2022-04-16 ENCOUNTER — Other Ambulatory Visit (HOSPITAL_COMMUNITY): Payer: Self-pay

## 2022-04-17 LAB — COMPLETE METABOLIC PANEL WITH GFR
AG Ratio: 1.1 (calc) (ref 1.0–2.5)
ALT: 12 U/L (ref 9–46)
AST: 14 U/L (ref 10–40)
Albumin: 4.3 g/dL (ref 3.6–5.1)
Alkaline phosphatase (APISO): 262 U/L — ABNORMAL HIGH (ref 36–130)
BUN: 10 mg/dL (ref 7–25)
CO2: 30 mmol/L (ref 20–32)
Calcium: 9.2 mg/dL (ref 8.6–10.3)
Chloride: 105 mmol/L (ref 98–110)
Creat: 1.14 mg/dL (ref 0.60–1.26)
Globulin: 3.8 g/dL (calc) — ABNORMAL HIGH (ref 1.9–3.7)
Glucose, Bld: 86 mg/dL (ref 65–99)
Potassium: 3.8 mmol/L (ref 3.5–5.3)
Sodium: 141 mmol/L (ref 135–146)
Total Bilirubin: 0.3 mg/dL (ref 0.2–1.2)
Total Protein: 8.1 g/dL (ref 6.1–8.1)
eGFR: 88 mL/min/{1.73_m2} (ref >=60)

## 2022-04-17 LAB — RPR: RPR Ser Ql: REACTIVE — AB

## 2022-04-17 LAB — HIV-1 RNA QUANT-NO REFLEX-BLD
HIV 1 RNA Quant: <20 Copies/mL — ABNORMAL HIGH
HIV-1 RNA Quant, Log: <1.3 Log cps/mL — ABNORMAL HIGH

## 2022-04-17 LAB — FLUORESCENT TREPONEMAL AB(FTA)-IGG-BLD: Fluorescent Treponemal ABS: REACTIVE — AB

## 2022-04-17 LAB — RPR TITER: RPR Titer: 1:2 {titer} — ABNORMAL HIGH

## 2022-04-20 NOTE — Progress Notes (Signed)
I called Aaron Lee and his family to discuss lab results - viral load is undetectable for the first time in years and he is showing some signs of immune recovery with CD4 count about 90.  Continue all medications until next OV in October.

## 2022-04-21 ENCOUNTER — Other Ambulatory Visit (HOSPITAL_COMMUNITY): Payer: Self-pay

## 2022-05-11 ENCOUNTER — Other Ambulatory Visit (HOSPITAL_COMMUNITY): Payer: Self-pay

## 2022-05-12 ENCOUNTER — Other Ambulatory Visit (HOSPITAL_COMMUNITY): Payer: Self-pay

## 2022-06-04 ENCOUNTER — Other Ambulatory Visit (HOSPITAL_COMMUNITY): Payer: Self-pay

## 2022-06-08 ENCOUNTER — Other Ambulatory Visit (HOSPITAL_COMMUNITY): Payer: Self-pay

## 2022-06-09 ENCOUNTER — Other Ambulatory Visit (HOSPITAL_COMMUNITY): Payer: Self-pay

## 2022-06-30 ENCOUNTER — Other Ambulatory Visit (HOSPITAL_COMMUNITY): Payer: Self-pay

## 2022-07-09 ENCOUNTER — Other Ambulatory Visit (HOSPITAL_COMMUNITY): Payer: Self-pay

## 2022-07-15 ENCOUNTER — Other Ambulatory Visit: Payer: Self-pay

## 2022-07-15 ENCOUNTER — Encounter: Payer: Self-pay | Admitting: Infectious Diseases

## 2022-07-15 ENCOUNTER — Ambulatory Visit (INDEPENDENT_AMBULATORY_CARE_PROVIDER_SITE_OTHER): Payer: Medicaid Other

## 2022-07-15 ENCOUNTER — Ambulatory Visit (INDEPENDENT_AMBULATORY_CARE_PROVIDER_SITE_OTHER): Payer: Medicaid Other | Admitting: Infectious Diseases

## 2022-07-15 VITALS — BP 113/75 | HR 73 | Temp 97.8°F | Ht 63.0 in | Wt 113.0 lb

## 2022-07-15 DIAGNOSIS — B20 Human immunodeficiency virus [HIV] disease: Secondary | ICD-10-CM

## 2022-07-15 DIAGNOSIS — Z23 Encounter for immunization: Secondary | ICD-10-CM

## 2022-07-15 NOTE — Patient Instructions (Addendum)
Nice to see you!   Keep up with your medication everyday like you have been!

## 2022-07-15 NOTE — Progress Notes (Signed)
u      Name: Aaron Lee  DOB: Apr 18, 1991  MRN: [id number]  PCP: Patient, No Pcp Per     SUBJECTIVE: Aaron Lee is a 31 y.o. male with HIV and AIDS in November 2018 with CD4 nadir < 10, VL 192,000 copies.  HIV Risk: MSM/sexual   OI Hx: shingles  Previous Regimens:  Biktarvy --> suppressed (changed to PI for adherence concern) Symtuza 2020 [facility] Dec 2022 --> failed with Y181C   Genotype:  07/2017 - K103S (R-nevirapine) 05-2019 - K103S, no integrase mutations  10/2019 - K103S, no integrase mutations.  10/2021 - K70KT, K103S, V106VAIT, Y181C, R211K (see phone note for cumulative genotype)    Chief Complaint  Patient presents with   Follow-up    B20     HPI: Aaron Lee is here for routine follow up care. He is doing well. Feeling better than he has in a while. Noticed hair and eyebrows growing back in. Skin has remained calmed down and no rashes/infections.  Has continued to take all 3 of his pills everyday with food. His father helps him very much. Sometimes the timing is off by a few hours but has not missed any doses from what he reports.  No sexual partners / encounters. Would like flu and covid booster today.  Has a new appointment with new [facility] counselor in November.     Review of Systems  Constitutional:  Negative for appetite change, chills, fatigue, fever and unexpected weight change.  Eyes:  Negative for visual disturbance.  Respiratory:  Negative for cough and shortness of breath.   Cardiovascular:  Negative for chest pain and leg swelling.  Gastrointestinal:  Negative for abdominal pain, diarrhea and nausea.  Genitourinary:  Negative for dysuria, genital sores and penile discharge.  Musculoskeletal:  Negative for joint swelling.  Skin:  Negative for color change and rash.  Neurological:  Negative for dizziness and headaches.  Hematological:  Negative for adenopathy.  Psychiatric/Behavioral:  Negative for sleep disturbance. The patient is not  nervous/anxious.      Past Medical History:  Diagnosis Date   Eczema    HIV (human immunodeficiency virus infection) ([place])    Shingles outbreak 09/26/2018    Social History   Tobacco Use   Smoking status: Light Smoker    Packs/day: 0.10    Types: Cigarettes   Smokeless tobacco: Never  Substance Use Topics   Alcohol use: Yes    Comment: occ   Drug use: Yes    Frequency: 7.0 times per week    Types: Marijuana    No Known Allergies   Outpatient Medications Prior to Visit  Medication Sig Dispense Refill   darunavir-cobicistat (PREZCOBIX) 800-150 MG tablet Take 1 tablet by mouth daily. 30 tablet 11   dolutegravir (TIVICAY) 50 MG tablet Take 1 tablet (50 mg total) by mouth daily. 30 tablet 11   sulfamethoxazole-trimethoprim (BACTRIM) 400-80 MG tablet Take 1 tablet by mouth daily. 30 tablet 11   No facility-administered medications prior to visit.           Objective: Vitals:   07/15/22 1536  BP: 113/75  Pulse: 73  Temp: 97.8 F (36.6 C)   Wt Readings from Last 3 Encounters:  07/15/22 113 lb (51.3 kg)  04/14/22 106 lb (48.1 kg)  12/30/21 109 lb (49.4 kg)    Physical Exam Constitutional:      Appearance: He is well-developed.     Comments: Seated comfortably in chair during visit.   HENT:  Mouth/Throat:     Dentition: Normal dentition. No dental abscesses.  Cardiovascular:     Rate and Rhythm: Normal rate and regular rhythm.     Heart sounds: Normal heart sounds.  Pulmonary:     Effort: Pulmonary effort is normal.     Breath sounds: Normal breath sounds.  Abdominal:     General: There is no distension.     Palpations: Abdomen is soft.     Tenderness: There is no abdominal tenderness.  Lymphadenopathy:     Cervical: No cervical adenopathy.  Skin:    General: Skin is warm and dry.     Findings: No rash.  Neurological:     Mental Status: He is alert and oriented to person, place, and time.  Psychiatric:        Judgment: Judgment normal.     Comments:  In good spirits today and engaged in care discussion.       Lab Results Lab Results  Component Value Date   WBC 4.1 08/06/2020   HGB 10.4 (L) 08/06/2020   HCT 32.6 (L) 08/06/2020   MCV 85.8 08/06/2020   PLT 105 (L) 08/06/2020    Lab Results  Component Value Date   CREATININE 1.14 04/14/2022   BUN 10 04/14/2022   NA 141 04/14/2022   K 3.8 04/14/2022   CL 105 04/14/2022   CO2 30 04/14/2022    Lab Results  Component Value Date   ALT 12 04/14/2022   AST 14 04/14/2022   BILITOT 0.3 04/14/2022    HIV 1 RNA Quant  Date Value  04/14/2022 <20 Copies/mL (H)  12/30/2021 161 copies/mL (H)  11/11/2021 12,100 Copies/mL (H)   CD4 T Cell Abs (/uL)  Date Value  04/14/2022 88 (L)  06/11/2021 <35 (L)  08/06/2020 <35 (L)    Assessment and Plan: Problem List Items Addressed This Visit       High   AIDS (acquired immune deficiency syndrome) ([place]) - Primary (Chronic)    Has had very difficult time with adherence in the past though now that we have been able to re-engage his family we are finally starting to see some improvement in Aaron Lee' health. His last labs in July revealed undectable VL on Prezcobix + Tivicay and some preliminary recovery of TCells from < 35 to 88. Will continue bactrim prophylaxis for him and current ART regimen. They are enjoying the mail order from [facility] and appreciate their help with refills.  VL/CD4 today Vaccines updated with Flu and covid  RTC in 3-4 months. Would also like to work on getting him in for dental care as well.       HIV disease ([place]) (Chronic)   Relevant Orders   HIV 1 RNA quant-no reflex-bld   T-helper cells (CD4) count   Other Visit Diagnoses     Need for immunization against influenza       Relevant Orders   Flu Vaccine QUAD 66mo+IM (Fluarix, Fluzone & Alfiuria Quad PF) (Completed)      Janene Madeira, MSN, NP-C [place] for Infectious Disease [email or web address] Pager:  [phone] Office: [place]: [phone]  07/15/2022

## 2022-07-15 NOTE — Assessment & Plan Note (Addendum)
Has had very difficult time with adherence in the past though now that we have been able to re-engage his family we are finally starting to see some improvement in [place]' health. His last labs in July revealed undectable VL on Prezcobix + Tivicay and some preliminary recovery of TCells from < 35 to 88. Will continue bactrim prophylaxis for him and current ART regimen. They are enjoying the mail order from [facility] and appreciate their help with refills.  VL/CD4 today Vaccines updated with Flu and covid  RTC in 3-4 months. Would also like to work on getting him in for dental care as well.

## 2022-07-16 LAB — T-HELPER CELLS (CD4) COUNT (NOT AT ARMC)
CD4 % Helper T Cell: 11 % — ABNORMAL LOW (ref 33–65)
CD4 T Cell Abs: 104 /uL — ABNORMAL LOW (ref 400–1790)

## 2022-07-17 LAB — HIV-1 RNA QUANT-NO REFLEX-BLD
HIV 1 RNA Quant: 694 Copies/mL — ABNORMAL HIGH
HIV-1 RNA Quant, Log: 2.84 Log cps/mL — ABNORMAL HIGH

## 2022-07-20 ENCOUNTER — Telehealth: Payer: Self-pay

## 2022-07-20 DIAGNOSIS — B20 Human immunodeficiency virus [HIV] disease: Secondary | ICD-10-CM

## 2022-07-20 NOTE — Addendum Note (Signed)
Addended by: Pettit Callas on: 07/20/2022 11:48 AM   Modules accepted: Orders

## 2022-07-20 NOTE — Telephone Encounter (Signed)
Called and spoke to patient to relay message below. Patient stated that he does miss some doses for a couple of days.   I scheduled his lab appointment on 11/2 for genotype. Does he still need to have lab done?

## 2022-07-20 NOTE — Telephone Encounter (Signed)
-----   Message from Ventress Callas, NP sent at 07/17/2022  3:20 PM EDT ----- Please give Aaron Lee a call - his viral load is 680. If he is taking his meds like he says, it shouldn't be this high. Can you also call call his dad to see how he thinks his adherence has been? They are his legal guardian and I have had all 3 in here for family meetings before.   If all agree he has not been skipping doses, we need to have him come back for a genotype (the one with integrase included) to make sure his medication can still work.   Thank you.

## 2022-07-23 ENCOUNTER — Other Ambulatory Visit: Payer: Medicaid Other

## 2022-07-23 ENCOUNTER — Other Ambulatory Visit: Payer: Self-pay

## 2022-07-23 DIAGNOSIS — B20 Human immunodeficiency virus [HIV] disease: Secondary | ICD-10-CM

## 2022-07-29 ENCOUNTER — Other Ambulatory Visit (HOSPITAL_COMMUNITY): Payer: Self-pay

## 2022-07-30 LAB — HIV-1 INTEGRASE GENOTYPE

## 2022-07-30 LAB — HIV-1 GENOTYPE: HIV-1 Genotype: DETECTED — AB

## 2022-07-30 LAB — HIV RNA, RTPCR W/R GT (RTI, PI,INT)
HIV 1 RNA Quant: 21300 copies/mL — ABNORMAL HIGH
HIV-1 RNA Quant, Log: 4.33 Log copies/mL — ABNORMAL HIGH

## 2022-08-03 ENCOUNTER — Telehealth: Payer: Self-pay

## 2022-08-03 NOTE — Telephone Encounter (Signed)
-----   Message from Blanchard Kelch, NP sent at 08/03/2022  1:32 PM EST ----- Please call Dhanush's father (in chart) to let him know that his viral load is up even higher to 21,300 copies. Based on the further testing it shows that the regimen he is on should still be working, it is clear he is missing a lot of his medication doses.  No changes at this time but would encourage him to try to get back on track getting in the Tivicay (yellow circle pill) and Prezcobix (pink oval pill) and the Bactrim (pneumonia pill). Hopeful to see improvement at his return visit in 3 months.

## 2022-08-03 NOTE — Progress Notes (Signed)
Please call Zyier's father (in chart) to let him know that his viral load is up even higher to 21,300 copies. Based on the further testing it shows that the regimen he is on should still be working, it is clear he is missing a lot of his medication doses.  No changes at this time but would encourage him to try to get back on track getting in the Tivicay (yellow circle pill) and Prezcobix (pink oval pill) and the Bactrim (pneumonia pill). Hopeful to see improvement at his return visit in 3 months.

## 2022-08-03 NOTE — Telephone Encounter (Signed)
I spoke with patient's father regarding patient's lab results. Advised him to make sure patient is taking his HIV regimen daily along with the bactrim. Patient's father verbalized understanding. Miku Udall T Pricilla Loveless

## 2022-08-04 ENCOUNTER — Other Ambulatory Visit (HOSPITAL_COMMUNITY): Payer: Self-pay

## 2022-08-24 ENCOUNTER — Other Ambulatory Visit (HOSPITAL_COMMUNITY): Payer: Self-pay

## 2022-08-28 ENCOUNTER — Other Ambulatory Visit (HOSPITAL_COMMUNITY): Payer: Self-pay

## 2022-08-31 ENCOUNTER — Other Ambulatory Visit: Payer: Self-pay

## 2022-09-18 ENCOUNTER — Other Ambulatory Visit (HOSPITAL_COMMUNITY): Payer: Self-pay

## 2022-09-28 ENCOUNTER — Ambulatory Visit (INDEPENDENT_AMBULATORY_CARE_PROVIDER_SITE_OTHER): Payer: Medicaid Other | Admitting: Infectious Diseases

## 2022-09-28 ENCOUNTER — Other Ambulatory Visit (HOSPITAL_COMMUNITY): Payer: Self-pay

## 2022-09-28 ENCOUNTER — Other Ambulatory Visit: Payer: Self-pay

## 2022-09-28 ENCOUNTER — Encounter: Payer: Self-pay | Admitting: Infectious Diseases

## 2022-09-28 VITALS — BP 121/77 | HR 82 | Temp 96.8°F | Ht 63.0 in | Wt 112.8 lb

## 2022-09-28 DIAGNOSIS — B20 Human immunodeficiency virus [HIV] disease: Secondary | ICD-10-CM

## 2022-09-28 NOTE — Assessment & Plan Note (Signed)
Last OV his CD4 improved to 100 but VL rebounded back to 23,000 in setting of poor adherence. 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 No changes with integrase susceptibility.  Continue Prezcobix + Tivicay + Bactrim prophy.  VL today. CD4 in 68m at return.  Vaccines up to date including covid booster.

## 2022-09-28 NOTE — Progress Notes (Signed)
u      Name: Aaron Lee  DOB: 1991-04-10  MRN: [id number]  PCP: Patient, No Pcp Per     SUBJECTIVE: Aaron Lee is a 32 y.o. male with HIV and AIDS in November 2018 with CD4 nadir < 10, VL 192,000 copies.  HIV Risk: MSM/sexual   OI Hx: shingles  Previous Regimens:  Biktarvy --> suppressed (changed to PI for adherence concern) Symtuza 2020 [facility] Dec 2022 --> failed with Y181C   Genotype:  07/2017 - K103S (R-nevirapine) 05-2019 - K103S, no integrase mutations  10/2019 - K103S, no integrase mutations.  10/2021 - K70KT, K103S, V106VAIT, Y181C, R211K (see phone note for cumulative genotype)    Chief Complaint  Patient presents with   Follow-up    B20     HPI: Aaron Lee is here for routine follow up for HIV / AIDS. Last VL > 23,000 and CD4 104. He admits he had fallen off his pill adherence. He says that after I called his father has been more helpful to get his pills in. Says he is taking all three but does not remember the names.   No sexual contact with any partners since LOV. Has been feeling well. No problems with skin rashes/ulcers.   Has a new case manager with [facility]. Not sure their name.     Review of Systems  Constitutional:  Negative for appetite change, chills, fatigue, fever and unexpected weight change.  Eyes:  Negative for visual disturbance.  Respiratory:  Negative for cough and shortness of breath.   Cardiovascular:  Negative for chest pain and leg swelling.  Gastrointestinal:  Negative for abdominal pain, diarrhea and nausea.  Genitourinary:  Negative for dysuria, genital sores and penile discharge.  Musculoskeletal:  Negative for joint swelling.  Skin:  Negative for color change and rash.  Neurological:  Negative for dizziness and headaches.  Hematological:  Negative for adenopathy.  Psychiatric/Behavioral:  Negative for sleep disturbance. The patient is not nervous/anxious.      Past Medical History:  Diagnosis Date   Eczema    HIV  (human immunodeficiency virus infection) (HCC)    Shingles outbreak 09/26/2018    Social History   Tobacco Use   Smoking status: Light Smoker    Packs/day: 0.10    Types: Cigarettes   Smokeless tobacco: Never  Substance Use Topics   Alcohol use: Yes    Comment: occ   Drug use: Yes    Frequency: 7.0 times per week    Types: Marijuana    No Known Allergies   Outpatient Medications Prior to Visit  Medication Sig Dispense Refill   darunavir-cobicistat (PREZCOBIX) 800-150 MG tablet Take 1 tablet by mouth daily. 30 tablet 11   dolutegravir (TIVICAY) 50 MG tablet Take 1 tablet (50 mg total) by mouth daily. 30 tablet 11   sulfamethoxazole-trimethoprim (BACTRIM) 400-80 MG tablet Take 1 tablet by mouth daily. 30 tablet 11   No facility-administered medications prior to visit.           Objective: Vitals:   09/28/22 1527  BP: 121/77  Pulse: 82  Temp: (!) 96.8 F (36 C)   Wt Readings from Last 3 Encounters:  09/28/22 112 lb 12.8 oz (51.2 kg)  07/15/22 113 lb (51.3 kg)  04/14/22 106 lb (48.1 kg)    Physical Exam Constitutional:      Appearance: He is well-developed.     Comments: Seated comfortably in chair during visit.   HENT:     Mouth/Throat:  Dentition: Normal dentition. No dental abscesses.  Cardiovascular:     Rate and Rhythm: Normal rate and regular rhythm.     Heart sounds: Normal heart sounds.  Pulmonary:     Effort: Pulmonary effort is normal.     Breath sounds: Normal breath sounds.  Abdominal:     General: There is no distension.     Palpations: Abdomen is soft.     Tenderness: There is no abdominal tenderness.  Lymphadenopathy:     Cervical: No cervical adenopathy.  Skin:    General: Skin is warm and dry.     Findings: No rash.  Neurological:     Mental Status: He is alert and oriented to person, place, and time.  Psychiatric:        Judgment: Judgment normal.     Comments: In good spirits today and engaged in care discussion.       Lab  Results Lab Results  Component Value Date   WBC 4.1 08/06/2020   HGB 10.4 (L) 08/06/2020   HCT 32.6 (L) 08/06/2020   MCV 85.8 08/06/2020   PLT 105 (L) 08/06/2020    Lab Results  Component Value Date   CREATININE 1.14 04/14/2022   BUN 10 04/14/2022   NA 141 04/14/2022   K 3.8 04/14/2022   CL 105 04/14/2022   CO2 30 04/14/2022    Lab Results  Component Value Date   ALT 12 04/14/2022   AST 14 04/14/2022   BILITOT 0.3 04/14/2022    HIV 1 RNA Quant  Date Value  07/23/2022 21,300 copies/mL (H)  07/15/2022 694 Copies/mL (H)  04/14/2022 <20 Copies/mL (H)   CD4 T Cell Abs (/uL)  Date Value  07/15/2022 104 (L)  04/14/2022 88 (L)  06/11/2021 <35 (L)    Assessment and Plan: Problem List Items Addressed This Visit       High   AIDS (acquired immune deficiency syndrome) ([place]) (Chronic)    Last OV his CD4 improved to 100 but VL rebounded back to 23,000 in setting of poor adherence. 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 No changes with integrase susceptibility.  Continue Prezcobix + Tivicay + Bactrim prophy.  VL today. CD4 in 25m at return.  Vaccines up to date including covid booster.         HIV disease ([place]) - Primary (Chronic)   Relevant Orders   HIV 1 RNA quant-no reflex-bld    Janene Madeira, MSN, NP-C [facility] [email or web address] Pager: [phone] Office: [place]: [phone]  09/28/2022

## 2022-09-28 NOTE — Patient Instructions (Signed)
Please stop by the lab on your way out.   Will call you and your dad with results once they come back.   Continue your Prezcobix, Tivicay and Bactrim everyday with food.    Please come back in 3 months to check in again.

## 2022-09-29 ENCOUNTER — Other Ambulatory Visit (HOSPITAL_COMMUNITY): Payer: Self-pay

## 2022-09-30 ENCOUNTER — Other Ambulatory Visit: Payer: Self-pay

## 2022-09-30 LAB — HIV-1 RNA QUANT-NO REFLEX-BLD
HIV 1 RNA Quant: 57 Copies/mL — ABNORMAL HIGH
HIV-1 RNA Quant, Log: 1.76 Log cps/mL — ABNORMAL HIGH

## 2022-10-02 ENCOUNTER — Other Ambulatory Visit: Payer: Self-pay

## 2022-10-02 ENCOUNTER — Other Ambulatory Visit (HOSPITAL_COMMUNITY): Payer: Self-pay

## 2022-10-07 ENCOUNTER — Other Ambulatory Visit (HOSPITAL_COMMUNITY): Payer: Self-pay

## 2022-10-21 ENCOUNTER — Other Ambulatory Visit (HOSPITAL_COMMUNITY): Payer: Self-pay

## 2022-10-21 ENCOUNTER — Other Ambulatory Visit: Payer: Self-pay | Admitting: Infectious Diseases

## 2022-10-21 DIAGNOSIS — B20 Human immunodeficiency virus [HIV] disease: Secondary | ICD-10-CM

## 2022-10-21 MED ORDER — TIVICAY 50 MG PO TABS
50.0000 mg | ORAL_TABLET | Freq: Every day | ORAL | 5 refills | Status: DC
  Filled 2022-10-21: qty 30, 30d supply, fill #0
  Filled 2022-11-19: qty 30, 30d supply, fill #1
  Filled 2022-12-17: qty 30, 30d supply, fill #2
  Filled 2023-01-18: qty 30, 30d supply, fill #3
  Filled 2023-02-11: qty 30, 30d supply, fill #4
  Filled 2023-03-10: qty 30, 30d supply, fill #5

## 2022-10-21 MED ORDER — PREZCOBIX 800-150 MG PO TABS
1.0000 | ORAL_TABLET | Freq: Every day | ORAL | 5 refills | Status: DC
  Filled 2022-10-21: qty 30, 30d supply, fill #0
  Filled 2022-11-19: qty 30, 30d supply, fill #1
  Filled 2022-12-17: qty 30, 30d supply, fill #2
  Filled 2023-01-18: qty 30, 30d supply, fill #3
  Filled 2023-02-11: qty 30, 30d supply, fill #4
  Filled 2023-03-10: qty 30, 30d supply, fill #5

## 2022-10-23 ENCOUNTER — Other Ambulatory Visit (HOSPITAL_COMMUNITY): Payer: Self-pay

## 2022-11-04 ENCOUNTER — Other Ambulatory Visit: Payer: Self-pay

## 2022-11-19 ENCOUNTER — Other Ambulatory Visit (HOSPITAL_COMMUNITY): Payer: Self-pay

## 2022-12-17 ENCOUNTER — Other Ambulatory Visit (HOSPITAL_COMMUNITY): Payer: Self-pay

## 2022-12-17 ENCOUNTER — Other Ambulatory Visit: Payer: Self-pay

## 2022-12-20 ENCOUNTER — Emergency Department (HOSPITAL_COMMUNITY)
Admission: EM | Admit: 2022-12-20 | Discharge: 2022-12-20 | Disposition: A | Discharge status: Home / Self Care | Payer: Medicaid Other | Attending: Emergency Medicine | Admitting: Emergency Medicine

## 2022-12-20 ENCOUNTER — Encounter (HOSPITAL_COMMUNITY): Payer: Self-pay | Admitting: Emergency Medicine

## 2022-12-20 DIAGNOSIS — R1084 Generalized abdominal pain: Secondary | ICD-10-CM | POA: Insufficient documentation

## 2022-12-20 DIAGNOSIS — U071 COVID-19: Secondary | ICD-10-CM | POA: Insufficient documentation

## 2022-12-20 DIAGNOSIS — R509 Fever, unspecified: Secondary | ICD-10-CM | POA: Diagnosis present

## 2022-12-20 LAB — CBC WITH DIFFERENTIAL/PLATELET
Abs Immature Granulocytes: 0.28 10*3/uL — ABNORMAL HIGH (ref 0.00–0.07)
Basophils Absolute: 0 10*3/uL (ref 0.0–0.1)
Basophils Relative: 1 %
Eosinophils Absolute: 0 10*3/uL (ref 0.0–0.5)
Eosinophils Relative: 0 %
HCT: 39.9 % (ref 39.0–52.0)
Hemoglobin: 12.6 g/dL — ABNORMAL LOW (ref 13.0–17.0)
Immature Granulocytes: 11 %
Lymphocytes Relative: 10 %
Lymphs Abs: 0.3 10*3/uL — ABNORMAL LOW (ref 0.7–4.0)
MCH: 28.1 pg (ref 26.0–34.0)
MCHC: 31.6 g/dL (ref 30.0–36.0)
MCV: 89.1 fL (ref 80.0–100.0)
Metamyelocytes Relative: 8 %
Monocytes Absolute: 0.2 10*3/uL (ref 0.1–1.0)
Monocytes Relative: 7 %
Myelocytes: 2 %
Neutro Abs: 2 10*3/uL (ref 1.7–7.7)
Neutrophils Relative %: 72 %
Platelets: 86 10*3/uL — ABNORMAL LOW (ref 150–400)
RBC: 4.48 MIL/uL (ref 4.22–5.81)
RDW: 15.2 % (ref 11.5–15.5)
WBC: 2.8 10*3/uL — ABNORMAL LOW (ref 4.0–10.5)
nRBC: 0 % (ref 0.0–0.2)

## 2022-12-20 LAB — COMPREHENSIVE METABOLIC PANEL
ALT: 35 U/L (ref 0–44)
AST: 38 U/L (ref 15–41)
Albumin: 3.6 g/dL (ref 3.5–5.0)
Alkaline Phosphatase: 200 U/L — ABNORMAL HIGH (ref 38–126)
Anion gap: 9 (ref 5–15)
BUN: 9 mg/dL (ref 6–20)
CO2: 23 mmol/L (ref 22–32)
Calcium: 8.6 mg/dL — ABNORMAL LOW (ref 8.9–10.3)
Chloride: 101 mmol/L (ref 98–111)
Creatinine, Ser: 1.16 mg/dL (ref 0.61–1.24)
GFR, Estimated: >60 mL/min (ref >60)
Glucose, Bld: 114 mg/dL — ABNORMAL HIGH (ref 70–99)
Potassium: 3.5 mmol/L (ref 3.5–5.1)
Sodium: 133 mmol/L — ABNORMAL LOW (ref 135–145)
Total Bilirubin: 0.7 mg/dL (ref 0.3–1.2)
Total Protein: 7.3 g/dL (ref 6.5–8.1)

## 2022-12-20 LAB — URINALYSIS, ROUTINE W REFLEX MICROSCOPIC
Glucose, UA: NEGATIVE mg/dL
Hgb urine dipstick: NEGATIVE
Ketones, ur: 5 mg/dL — AB
Leukocytes,Ua: NEGATIVE
Nitrite: NEGATIVE
Protein, ur: 100 mg/dL — AB
Specific Gravity, Urine: >1.046 — ABNORMAL HIGH (ref 1.005–1.030)
pH: 5 (ref 5.0–8.0)

## 2022-12-20 LAB — SARS CORONAVIRUS 2 BY RT PCR: SARS Coronavirus 2 by RT PCR: POSITIVE — AB

## 2022-12-20 LAB — LIPASE, BLOOD: Lipase: 27 U/L (ref 11–51)

## 2022-12-20 MED ORDER — ACETAMINOPHEN 500 MG PO TABS
1000.0000 mg | ORAL_TABLET | ORAL | Status: AC
  Administered 2022-12-20: 1000 mg via ORAL
  Filled 2022-12-20: qty 2

## 2022-12-20 MED ORDER — DICYCLOMINE HCL 20 MG PO TABS
20.0000 mg | ORAL_TABLET | Freq: Two times a day (BID) | ORAL | 0 refills | Status: DC
  Filled 2022-12-20 – 2022-12-21 (×2): qty 20, 10d supply, fill #0

## 2022-12-20 MED ORDER — ONDANSETRON 4 MG PO TBDP
4.0000 mg | ORAL_TABLET | Freq: Three times a day (TID) | ORAL | 0 refills | Status: DC | PRN
  Filled 2022-12-20 – 2022-12-21 (×2): qty 20, 7d supply, fill #0

## 2022-12-20 MED ORDER — DICYCLOMINE HCL 10 MG PO CAPS
20.0000 mg | ORAL_CAPSULE | Freq: Once | ORAL | Status: AC
  Administered 2022-12-20: 20 mg via ORAL
  Filled 2022-12-20: qty 2

## 2022-12-20 MED ORDER — KETOROLAC TROMETHAMINE 15 MG/ML IJ SOLN
15.0000 mg | Freq: Once | INTRAMUSCULAR | Status: AC
  Administered 2022-12-20: 15 mg via INTRAVENOUS
  Filled 2022-12-20: qty 1

## 2022-12-20 MED ORDER — LACTATED RINGERS IV BOLUS
1000.0000 mL | Freq: Once | INTRAVENOUS | Status: AC
  Administered 2022-12-20: 1000 mL via INTRAVENOUS

## 2022-12-20 MED ORDER — ONDANSETRON 4 MG PO TBDP
4.0000 mg | ORAL_TABLET | Freq: Once | ORAL | Status: AC
  Administered 2022-12-20: 4 mg via ORAL
  Filled 2022-12-20: qty 1

## 2022-12-20 NOTE — ED Triage Notes (Signed)
Pt reports umbilical pain central to abdomen. Started this morning. Last BM today was normal. Endorses nausea but no vomiting.

## 2022-12-20 NOTE — Discharge Instructions (Addendum)
You are diagnosed with COVID-19, rest, hydrate, take Tylenol ibuprofen for fever as discussed below.  Bentyl for abdominal pain as needed as prescribed, Zofran for nausea  Please use Tylenol or ibuprofen for pain.  You may use 600 mg ibuprofen every 6 hours or 1000 mg of Tylenol every 6 hours.  You may choose to alternate between the 2.  This would be most effective.  Not to exceed 4 g of Tylenol within 24 hours.  Not to exceed 3200 mg ibuprofen 24 hours.    If your symptoms worsen please return to emergency room.

## 2022-12-20 NOTE — ED Provider Notes (Signed)
[facility] Provider Note   CSN: [id number] Arrival date & time: 12/20/22  0034     History  Chief Complaint  Patient presents with   Abdominal Pain    Aaron Lee is a 32 y.o. male.   Abdominal Pain Patient is a 32 year old male present emergency room today with complaints of generalized abdominal pain, nausea, no vomiting, no diarrhea.  Denies any fevers but was found to have a fever here in the emergency room.  Endorses cough congestion and fatigue over the past 2 or 3 days.  No recent sick contacts, no blood in stool, no recent antibiotics.  No chest pain difficulty breathing no hemoptysis or lightheadedness or dizziness.  Denies any urinary frequency urgency dysuria or hematuria.  No testicular pain.  No other associate symptoms.     Home Medications Prior to Admission medications   Medication Sig Start Date End Date Taking? Authorizing Provider  darunavir-cobicistat (PREZCOBIX) 800-150 MG tablet Take 1 tablet by mouth daily. 10/21/22   [place]ard Callas, NP  dolutegravir (TIVICAY) 50 MG tablet Take 1 tablet (50 mg total) by mouth daily. 10/21/22   [place] Callas, NP  sulfamethoxazole-trimethoprim (BACTRIM) 400-80 MG tablet Take 1 tablet by mouth daily. 04/14/22   [place] Callas, NP      Allergies    Patient has no known allergies.    Review of Systems   Review of Systems  Gastrointestinal:  Positive for abdominal pain.    Physical Exam Updated Vital Signs BP 126/73 (BP Location: Right Arm)   Pulse 100   Temp (!) 100.4 F (38 C)   Resp 16   Ht 5\' 3"  (1.6 m)   Wt 47.6 kg   SpO2 100%   BMI 18.60 kg/m  Physical Exam Vitals and nursing note reviewed.  Constitutional:      General: He is not in acute distress. HENT:     Head: Normocephalic and atraumatic.     Nose: Nose normal.     Mouth/Throat:     Mouth: Mucous membranes are dry.  Eyes:     General: No scleral icterus. Cardiovascular:      Rate and Rhythm: Regular rhythm. Tachycardia present.     Pulses: Normal pulses.     Heart sounds: Normal heart sounds.     Comments: Heart rate 100-1 04 Pulmonary:     Effort: Pulmonary effort is normal. No respiratory distress.     Breath sounds: No wheezing.  Abdominal:     Palpations: Abdomen is soft.     Tenderness: There is no abdominal tenderness. There is no right CVA tenderness, left CVA tenderness, guarding or rebound.  Musculoskeletal:     Cervical back: Normal range of motion.     Right lower leg: No edema.     Left lower leg: No edema.  Skin:    General: Skin is warm and dry.     Capillary Refill: Capillary refill takes less than 2 seconds.     Comments: Warm to touch  Neurological:     Mental Status: He is alert. Mental status is at baseline.  Psychiatric:        Mood and Affect: Mood normal.        Behavior: Behavior normal.     ED Results / Procedures / Treatments   Labs (all labs ordered are listed, but only abnormal results are displayed) Labs Reviewed  URINALYSIS, ROUTINE W REFLEX MICROSCOPIC - Abnormal; Notable for the following  components:      Result Value   Color, Urine AMBER (*)    APPearance HAZY (*)    Specific Gravity, Urine >1.046 (*)    Bilirubin Urine SMALL (*)    Ketones, ur 5 (*)    Protein, ur 100 (*)    Bacteria, UA FEW (*)    All other components within normal limits  COMPREHENSIVE METABOLIC PANEL - Abnormal; Notable for the following components:   Sodium 133 (*)    Glucose, Bld 114 (*)    Calcium 8.6 (*)    Alkaline Phosphatase 200 (*)    All other components within normal limits  CBC WITH DIFFERENTIAL/PLATELET - Abnormal; Notable for the following components:   WBC 2.8 (*)    Hemoglobin 12.6 (*)    Platelets 86 (*)    Lymphs Abs 0.3 (*)    Abs Immature Granulocytes 0.28 (*)    All other components within normal limits  SARS CORONAVIRUS 2 BY RT PCR  LIPASE, BLOOD    EKG None  Radiology No results  found.  Procedures Procedures    Medications Ordered in ED Medications  ondansetron (ZOFRAN-ODT) disintegrating tablet 4 mg (4 mg Oral Given 12/20/22 0055)  acetaminophen (TYLENOL) tablet 1,000 mg (1,000 mg Oral Given 12/20/22 0231)  lactated ringers bolus 1,000 mL (1,000 mLs Intravenous New Bag/Given 12/20/22 0234)  dicyclomine (BENTYL) capsule 20 mg (20 mg Oral Given 12/20/22 0231)    ED Course/ Medical Decision Making/ A&P Clinical Course as of 12/20/22 0410  Sun Dec 20, 2022  0346 SARS Coronavirus 2 by RT PCR(!): POSITIVE [WF]    Clinical Course User Index [WF] Tedd Sias, PA                             Medical Decision Making Amount and/or Complexity of Data Reviewed Labs: ordered.  Risk OTC drugs. Prescription drug management.   This patient presents to the ED for concern of abd pain, this involves a number of treatment options, and is a complaint that carries with it a moderate risk of complications and morbidity. A differential diagnosis was considered for the patient's symptoms which is discussed below:   The causes of generalized abdominal pain include but are not limited to AAA, mesenteric ischemia, appendicitis, diverticulitis, DKA, gastritis, gastroenteritis, AMI, nephrolithiasis, pancreatitis, peritonitis, adrenal insufficiency,lead poisoning, iron toxicity, intestinal ischemia, constipation, UTI,SBO/LBO, splenic rupture, biliary disease, IBD, IBS, PUD, or hepatitis.   Co morbidities: Discussed in HPI   Brief History:  Patient is a 32 year old male present emergency room today with complaints of generalized abdominal pain, nausea, no vomiting, no diarrhea.  Denies any fevers but was found to have a fever here in the emergency room.  Endorses cough congestion and fatigue over the past 2 or 3 days.  No recent sick contacts, no blood in stool, no recent antibiotics.  No chest pain difficulty breathing no hemoptysis or lightheadedness or dizziness.  Denies  any urinary frequency urgency dysuria or hematuria.  No testicular pain.  No other associate symptoms.    EMR reviewed including pt PMHx, past surgical history and past visits to ER.   See HPI for more details   Lab Tests:  I ordered and independently interpreted labs. Labs notable for No leukocytosis but there is a mild leukopenia, platelets slightly low but consistent with patient's baseline.  CMP with mild elevation of alk phos otherwise unremarkable apart from mild hyponatremia, urinalysis with elevated specific gravity  consistent with dehydration which is evident on exam with dry oral mucosa.  COVID test is positive. I suspect this is the source of his fever.  Lipase within normal limits doubt pancreatitis.  Imaging Studies:  No imaging studies ordered for this patient    Cardiac Monitoring:  The patient was maintained on a cardiac monitor.  I personally viewed and interpreted the cardiac monitored which showed an underlying rhythm of: Sinus tachycardia max rate 104 NA   Medicines ordered:  I ordered medication including Toradol, lactated Ringer's, Tylenol, Bentyl, Zofran for pain, nausea, dehydration Reevaluation of the patient after these medicines showed that the patient improved I have reviewed the patients home medicines and have made adjustments as needed   Critical Interventions:     Consults/Attending Physician      Reevaluation:  After the interventions noted above I re-evaluated patient and found that they have :resolved   Social Determinants of Health:      Problem List / ED Course:  Patient with abdominal pain, generalized abdominal discomfort that resolved with dose of Tylenol, Zofran and lactated Ringer's.  On my initial evaluation patient's abdomen soft nontender.  No guarding or rebound.  Although patient does have a fever I suspect that is related to a viral illness as he does endorse some cough and sinus congestion.  He did test  positive for COVID-19.  I will low suspicion for patient also having an acute intra-abdominal infection.  His leukopenia is consistent with his COVID-19 and he is overall well-appearing after his fever was treated.  Upon discharge his blood pressure is [id number] systolic, temperature is normalized and he is afebrile.  He is symptom-free currently tolerating p.o. and ambulatory.  Given strict return precautions to him and father.  They understand that he has an acute viral illness which will cause some symptoms however they understand that return to the emergency room for worsening abdominal pain or any other new or concerning symptoms it is reasonable.  Otherwise follow-up with PCP.   Dispostion:  After consideration of the diagnostic results and the patients response to treatment, I feel that the patent would benefit from outpatient follow-up.   Final Clinical Impression(s) / ED Diagnoses Final diagnoses:  COVID-19  Generalized abdominal pain    Rx / DC Orders ED Discharge Orders     None         Tedd Sias, [place] 12/20/22 0500    Maudie Flakes, MD 12/21/22 [phone]

## 2022-12-21 ENCOUNTER — Other Ambulatory Visit: Payer: Self-pay

## 2022-12-21 ENCOUNTER — Other Ambulatory Visit (HOSPITAL_COMMUNITY): Payer: Self-pay

## 2022-12-28 ENCOUNTER — Ambulatory Visit (INDEPENDENT_AMBULATORY_CARE_PROVIDER_SITE_OTHER): Payer: Medicaid Other | Admitting: Infectious Diseases

## 2022-12-28 ENCOUNTER — Encounter: Payer: Self-pay | Admitting: Infectious Diseases

## 2022-12-28 ENCOUNTER — Other Ambulatory Visit: Payer: Self-pay

## 2022-12-28 VITALS — BP 110/74 | HR 76 | Temp 97.9°F | Wt 108.0 lb

## 2022-12-28 DIAGNOSIS — B20 Human immunodeficiency virus [HIV] disease: Secondary | ICD-10-CM | POA: Diagnosis not present

## 2022-12-28 DIAGNOSIS — J302 Other seasonal allergic rhinitis: Secondary | ICD-10-CM | POA: Diagnosis not present

## 2022-12-28 DIAGNOSIS — U071 COVID-19: Secondary | ICD-10-CM

## 2022-12-28 DIAGNOSIS — Z21 Asymptomatic human immunodeficiency virus [HIV] infection status: Secondary | ICD-10-CM | POA: Diagnosis not present

## 2022-12-28 NOTE — Assessment & Plan Note (Addendum)
Last CD4 > 100 and viral load was 57 copies. Will update labs today for adherence monitoring. He has done much better with his father's help. Will call him with lab results. FU in 58m. Politely declined STI screening today.

## 2022-12-28 NOTE — Progress Notes (Signed)
u      Name: Aaron HeldMarcus D Orbach  DOB: 12/23/1990  MRN: [id number]  PCP: Patient, No Pcp Per     SUBJECTIVE: Aaron Lee is a 32 y.o. male with HIV and AIDS in November 2018 with CD4 nadir < 10, VL 192,000 copies.  HIV Risk: MSM/sexual   OI Hx: shingles  Previous Regimens:  Biktarvy --> suppressed (changed to PI for adherence concern) [facility] Y[facility]C   Genotype:  07/2017 - K103S (R-nevirapine) 05-2019 - K103S, no integrase mutations  10/2019 - K103S, no integrase mutations.  10/2021 - K70KT, K103S, V106VAIT, Y181C, R211K (see phone note for cumulative genotype)    Chief Complaint  Patient presents with   Follow-up     HPI: Here today for HIV follow up care. Has been doing well and traveled to [place] recently.  Continues to take "his two HIV pills". Not taking bactrim any longer. No concerns today he would like to discuss. Still living with dad and he helps with his pills everyday. He is wondering who his [facility] case worker is - [facility]   Recently seen in ER for abdominal pain and cough/congestion - tested positive for COVID.  He was around 2 young kids (3 and 7012) who were also sick. Primarily had bad stomach pains and coughing.   HIV 1 RNA Quant  Date Value  09/28/2022 57 Copies/mL (H)  07/23/2022 21,300 copies/mL (H)  07/15/2022 694 Copies/mL (H)   CD4 T Cell Abs (/uL)  Date Value  07/15/2022 104 (L)  04/14/2022 88 (L)  06/11/2021 <35 (L)      Review of Systems  Constitutional:  Negative for appetite change, chills, fatigue, fever and unexpected weight change.  Eyes:  Negative for visual disturbance.  Respiratory:  Negative for cough and shortness of breath.   Cardiovascular:  Negative for chest pain and leg swelling.  Gastrointestinal:  Negative for abdominal pain, diarrhea and nausea.  Genitourinary:  Negative for dysuria, genital sores and penile discharge.   Musculoskeletal:  Negative for joint swelling.  Skin:  Negative for color change and rash.  Neurological:  Negative for dizziness and headaches.  Hematological:  Negative for adenopathy.  Psychiatric/Behavioral:  Negative for sleep disturbance. The patient is not nervous/anxious.      Past Medical History:  Diagnosis Date   Eczema    HIV (human immunodeficiency virus infection)    Shingles outbreak 09/26/2018    Social History   Tobacco Use   Smoking status: Light Smoker    Packs/day: .1    Types: Cigarettes   Smokeless tobacco: Never  Substance Use Topics   Alcohol use: Yes    Comment: occ   Drug use: Yes    Frequency: 7.0 times per week    Types: Marijuana    No Known Allergies   Outpatient Medications Prior to Visit  Medication Sig Dispense Refill   darunavir-cobicistat (PREZCOBIX) 800-150 MG tablet Take 1 tablet by mouth daily. 30 tablet 5   dolutegravir (TIVICAY) 50 MG tablet Take 1 tablet (50 mg total) by mouth daily. 30 tablet 5   sulfamethoxazole-trimethoprim (BACTRIM) 400-80 MG tablet Take 1 tablet by mouth daily. (Patient not taking: Reported on 12/28/2022) 30 tablet 11   dicyclomine (BENTYL) 20 MG tablet Take 1 tablet (20 mg total) by mouth 2 (two) times daily. (Patient not taking: Reported on 12/28/2022) 20 tablet 0   ondansetron (ZOFRAN-ODT) 4 MG disintegrating tablet Dissolve 1  tablet (4 mg total) by mouth every 8 (eight) hours as needed for nausea or vomiting. (Patient not taking: Reported on 12/28/2022) 20 tablet 0   No facility-administered medications prior to visit.           Objective: Vitals:   12/28/22 1557  BP: 110/74  Pulse: 76  Temp: 97.9 F (36.6 C)  SpO2: 98%   Wt Readings from Last 3 Encounters:  12/28/22 108 lb (49 kg)  12/20/22 104 lb 15 oz (47.6 kg)  09/28/22 112 lb 12.8 oz (51.2 kg)    Physical Exam Constitutional:      Appearance: He is well-developed.     Comments: Seated comfortably in chair during visit.   HENT:      Mouth/Throat:     Dentition: Normal dentition. No dental abscesses.  Cardiovascular:     Rate and Rhythm: Normal rate and regular rhythm.     Heart sounds: Normal heart sounds.  Pulmonary:     Effort: Pulmonary effort is normal.     Breath sounds: Normal breath sounds.  Abdominal:     General: There is no distension.     Palpations: Abdomen is soft.     Tenderness: There is no abdominal tenderness.  Lymphadenopathy:     Cervical: No cervical adenopathy.  Skin:    General: Skin is warm and dry.     Findings: No rash.  Neurological:     Mental Status: He is alert and oriented to person, place, and time.  Psychiatric:        Judgment: Judgment normal.     Comments: In good spirits today and engaged in care discussion.       Lab Results Lab Results  Component Value Date   WBC 2.8 (L) 12/20/2022   HGB 12.6 (L) 12/20/2022   HCT 39.9 12/20/2022   MCV 89.1 12/20/2022   PLT 86 (L) 12/20/2022    Lab Results  Component Value Date   CREATININE 1.16 12/20/2022   BUN 9 12/20/2022   NA 133 (L) 12/20/2022   K 3.5 12/20/2022   CL 101 12/20/2022   CO2 23 12/20/2022    Lab Results  Component Value Date   ALT 35 12/20/2022   AST 38 12/20/2022   ALKPHOS 200 (H) 12/20/2022   BILITOT 0.7 12/20/2022    HIV 1 RNA Quant  Date Value  09/28/2022 57 Copies/mL (H)  07/23/2022 21,300 copies/mL (H)  07/15/2022 694 Copies/mL (H)   CD4 T Cell Abs (/uL)  Date Value  07/15/2022 104 (L)  04/14/2022 88 (L)  06/11/2021 <35 (L)    Assessment and Plan: Problem List Items Addressed This Visit       High   AIDS (acquired immune deficiency syndrome) (Chronic)    Last CD4 > 100 and viral load was 57 copies. Will update labs today for adherence monitoring. He has done much better with his father's help. Will call him with lab results. FU in 43m. Politely declined STI screening today.          Unprioritized   Seasonal allergic rhinitis    Recommend OTC antihistamine as needed for  allergies. Avoid flonase with prezcobix.       COVID-19 in immunocompromised patient    Recovered 100% from acute COVID infection recently. Provided him a mask on the way out today to continue 10-d mask recommendation.       Other Visit Diagnoses     Asymptomatic HIV infection, with no history of HIV-related illness    -  Primary   Relevant Orders   HIV 1 RNA quant-no reflex-bld   T-helper cells (CD4) count       Rexene Alberts, MSN, NP-C [facility]  [email or web address] Pager: [phone] Office: [phone] [facility] Main Line: [phone]  12/28/2022

## 2022-12-28 NOTE — Patient Instructions (Signed)
If you start getting allergy symptoms you can take Zyrtec over the counter.

## 2022-12-28 NOTE — Assessment & Plan Note (Signed)
Recovered 100% from acute COVID infection recently. Provided him a mask on the way out today to continue 10-d mask recommendation.

## 2022-12-28 NOTE — Assessment & Plan Note (Signed)
Recommend OTC antihistamine as needed for allergies. Avoid flonase with prezcobix.

## 2022-12-29 LAB — T-HELPER CELLS (CD4) COUNT (NOT AT ARMC)
CD4 % Helper T Cell: 12 % — ABNORMAL LOW (ref 33–65)
CD4 T Cell Abs: 91 /uL — ABNORMAL LOW (ref 400–1790)

## 2022-12-31 ENCOUNTER — Telehealth: Payer: Self-pay

## 2022-12-31 LAB — HIV-1 RNA QUANT-NO REFLEX-BLD
HIV 1 RNA Quant: 1060 Copies/mL — ABNORMAL HIGH
HIV-1 RNA Quant, Log: 3.03 Log cps/mL — ABNORMAL HIGH

## 2022-12-31 NOTE — Telephone Encounter (Signed)
Called [facility] to discuss results, no answer. Left HIPAA compliant voicemail requesting callback.   Called his father as well, no answer and voicemail box not set up.   Sandie Ano, RN

## 2022-12-31 NOTE — Telephone Encounter (Signed)
-----   Message from Blanchard Kelch, NP sent at 12/31/2022  3:21 PM EDT ----- Please call Aaron Lee to let him know that his viral load is high at 1,060.  We may need him to come back to recheck his labs in a few weeks for more testing... he swore to me he was taking this, but as you can see 5 months ago he was having trouble fitting in his Tivicay + Prezcobix everyday.   Please also call to update his father as well with the same. Recommend to really make sure he is getting both in everyday with a meal.

## 2022-12-31 NOTE — Telephone Encounter (Signed)
Patient and patient's father informed together of patient's VL and to make sure patient is taking medication daily with no missed doses. Per patient father patient has been taking the medication off and on. Aaron Lee'

## 2022-12-31 NOTE — Progress Notes (Signed)
Please call Aaron Lee to let him know that his viral load is high at 1,060.  We may need him to come back to recheck his labs in a few weeks for more testing... he swore to me he was taking this, but as you can see 5 months ago he was having trouble fitting in his Tivicay + Prezcobix everyday.   Please also call to update his father as well with the same. Recommend to really make sure he is getting both in everyday with a meal.

## 2023-01-18 ENCOUNTER — Other Ambulatory Visit: Payer: Self-pay

## 2023-01-19 ENCOUNTER — Other Ambulatory Visit (HOSPITAL_COMMUNITY): Payer: Self-pay

## 2023-02-11 ENCOUNTER — Other Ambulatory Visit (HOSPITAL_COMMUNITY): Payer: Self-pay

## 2023-03-10 ENCOUNTER — Other Ambulatory Visit (HOSPITAL_COMMUNITY): Payer: Self-pay

## 2023-03-22 ENCOUNTER — Ambulatory Visit (INDEPENDENT_AMBULATORY_CARE_PROVIDER_SITE_OTHER): Payer: Medicaid Other | Admitting: Infectious Diseases

## 2023-03-22 ENCOUNTER — Encounter: Payer: Self-pay | Admitting: Infectious Diseases

## 2023-03-22 ENCOUNTER — Other Ambulatory Visit: Payer: Self-pay

## 2023-03-22 VITALS — BP 134/75 | HR 75 | Temp 96.4°F | Ht 63.0 in | Wt 111.0 lb

## 2023-03-22 DIAGNOSIS — Z79899 Other long term (current) drug therapy: Secondary | ICD-10-CM | POA: Diagnosis not present

## 2023-03-22 DIAGNOSIS — M85642 Other cyst of bone, left hand: Secondary | ICD-10-CM

## 2023-03-22 DIAGNOSIS — Z21 Asymptomatic human immunodeficiency virus [HIV] infection status: Secondary | ICD-10-CM

## 2023-03-22 DIAGNOSIS — B20 Human immunodeficiency virus [HIV] disease: Secondary | ICD-10-CM | POA: Diagnosis not present

## 2023-03-22 NOTE — Assessment & Plan Note (Signed)
Doing better but still some room for improvement with adherence. Last VL about 100. Will repeat today. He won't take bactrim prophy as previously prescribed, despite many discussions.  No signs of advancing HIV today. He actually looks pretty good. Return in about 3 months (around 07/06/2023).  Will get him flu shot at this visit.

## 2023-03-22 NOTE — Patient Instructions (Signed)
Will see you back in October - enjoy your summer!  Keep your medications up - will refer you to hand surgery for the cyst

## 2023-03-22 NOTE — Progress Notes (Signed)
u      Name: Aaron Lee  DOB: August 23, 1991  MRN: [id number]  PCP: Patient, No Pcp Per     SUBJECTIVE: Aaron Lee is a 32 y.o. male with HIV and AIDS in November 2018 with CD4 nadir < 10, VL 192,000 copies.  HIV Risk: MSM/sexual   OI Hx: shingles  Previous Regimens:  Biktarvy --> suppressed (changed to PI for adherence concern) Symtuza 2020 [facility] Dec 2022 --> failed with Y181C   Genotype:  07/2017 - K103S (R-nevirapine) 05-2019 - K103S, no integrase mutations  10/2019 - K103S, no integrase mutations.  10/2021 - K70KT, K103S, V106VAIT, Y181C, R211K (see phone note for cumulative genotype)    Chief Complaint  Patient presents with   Follow-up    B20     HPI: Here today for HIV follow up care. Has been doing well since LOV and no complaints. He states he has been getting his Prezcobix and Tivicay in everyday.   Has a cyst waxing and waning on left hand/wrist for years. It is irritating him and large today. He would like to see about removing this if possible to avoid it from returning.    Review of Systems  All other systems reviewed and are negative.    Past Medical History:  Diagnosis Date   Eczema    HIV (human immunodeficiency virus infection) (HCC)    Shingles outbreak 09/26/2018    Social History   Tobacco Use   Smoking status: Light Smoker    Packs/day: .1    Types: Cigarettes   Smokeless tobacco: Never  Substance Use Topics   Alcohol use: Yes    Comment: occ   Drug use: Yes    Frequency: 7.0 times per week    Types: Marijuana    No Known Allergies   Outpatient Medications Prior to Visit  Medication Sig Dispense Refill   darunavir-cobicistat (PREZCOBIX) 800-150 MG tablet Take 1 tablet by mouth daily. 30 tablet 5   dolutegravir (TIVICAY) 50 MG tablet Take 1 tablet (50 mg total) by mouth daily. 30 tablet 5   sulfamethoxazole-trimethoprim (BACTRIM) 400-80 MG tablet Take 1 tablet by mouth daily. 30 tablet 11   No facility-administered  medications prior to visit.           Objective: Vitals:   03/22/23 1535  BP: 134/75  Pulse: 75  Temp: (!) 96.4 F (35.8 C)   Wt Readings from Last 3 Encounters:  03/22/23 111 lb (50.3 kg)  12/28/22 108 lb (49 kg)  12/20/22 104 lb 15 oz (47.6 kg)    Physical Exam Constitutional:      Appearance: He is well-developed.     Comments: Seated comfortably in chair during visit.   HENT:     Mouth/Throat:     Dentition: Normal dentition. No dental abscesses.  Cardiovascular:     Rate and Rhythm: Normal rate and regular rhythm.     Heart sounds: Normal heart sounds.  Pulmonary:     Effort: Pulmonary effort is normal.     Breath sounds: Normal breath sounds.  Abdominal:     General: There is no distension.     Palpations: Abdomen is soft.     Tenderness: There is no abdominal tenderness.  Lymphadenopathy:     Cervical: No cervical adenopathy.  Skin:    General: Skin is warm and dry.     Findings: No rash.  Neurological:     Mental Status: He is alert and oriented to person, place, and time.  Psychiatric:        Judgment: Judgment normal.     Comments: In good spirits today and engaged in care discussion.      Lab Results Lab Results  Component Value Date   WBC 2.8 (L) 12/20/2022   HGB 12.6 (L) 12/20/2022   HCT 39.9 12/20/2022   MCV 89.1 12/20/2022   PLT 86 (L) 12/20/2022    Lab Results  Component Value Date   CREATININE 1.16 12/20/2022   BUN 9 12/20/2022   NA 133 (L) 12/20/2022   K 3.5 12/20/2022   CL 101 12/20/2022   CO2 23 12/20/2022    Lab Results  Component Value Date   ALT 35 12/20/2022   AST 38 12/20/2022   ALKPHOS 200 (H) 12/20/2022   BILITOT 0.7 12/20/2022    HIV 1 RNA Quant  Date Value  12/28/2022 1,060 Copies/mL (H)  09/28/2022 57 Copies/mL (H)  07/23/2022 21,300 copies/mL (H)   CD4 T Cell Abs (/uL)  Date Value  12/28/2022 91 (L)  07/15/2022 104 (L)  04/14/2022 88 (L)    Assessment and Plan: Problem List Items Addressed This Visit        High   AIDS (acquired immune deficiency syndrome) (HCC) - Primary (Chronic)    Doing better but still some room for improvement with adherence. Last VL about 100. Will repeat today. He won't take bactrim prophy as previously prescribed, despite many discussions.  No signs of advancing HIV today. He actually looks pretty good. Return in about 3 months (around 07/06/2023).  Will get him flu shot at this visit.          Unprioritized   Cyst of bone of left hand    Recurrent problem and quite large today. Photos provided. Nothing is acutely infected. Will refer to hand surgery for management.       Relevant Orders   Ambulatory referral to Hand Surgery   Other Visit Diagnoses     Asymptomatic HIV infection, with no history of HIV-related illness (HCC)       Relevant Orders   HIV 1 RNA quant-no reflex-bld   CBC   T-helper cells (CD4) count   COMPLETE METABOLIC PANEL WITH GFR   Lipid panel      Rexene Alberts, MSN, NP-C [facility]  [email or web address] Pager: [phone] Office: [phone] [facility] Main Line: [phone]  03/22/2023

## 2023-03-22 NOTE — Assessment & Plan Note (Signed)
Recurrent problem and quite large today. Photos provided. Nothing is acutely infected. Will refer to hand surgery for management.

## 2023-03-23 LAB — T-HELPER CELLS (CD4) COUNT (NOT AT ARMC)
CD4 % Helper T Cell: 13 % — ABNORMAL LOW (ref 33–65)
CD4 T Cell Abs: 110 /uL — ABNORMAL LOW (ref 400–1790)

## 2023-03-24 ENCOUNTER — Telehealth: Payer: Self-pay

## 2023-03-24 LAB — CBC
HCT: 41.2 % (ref 38.5–50.0)
Hemoglobin: 13.5 g/dL (ref 13.2–17.1)
MCH: 28.6 pg (ref 27.0–33.0)
MCHC: 32.8 g/dL (ref 32.0–36.0)
MCV: 87.3 fL (ref 80.0–100.0)
MPV: 12.6 fL — ABNORMAL HIGH (ref 7.5–12.5)
Platelets: 82 10*3/uL — ABNORMAL LOW (ref 140–400)
RBC: 4.72 10*6/uL (ref 4.20–5.80)
RDW: 14.3 % (ref 11.0–15.0)
WBC: 3.1 10*3/uL — ABNORMAL LOW (ref 3.8–10.8)

## 2023-03-24 LAB — COMPLETE METABOLIC PANEL WITH GFR
AG Ratio: 1.3 (calc) (ref 1.0–2.5)
ALT: 18 U/L (ref 9–46)
AST: 19 U/L (ref 10–40)
Albumin: 4.2 g/dL (ref 3.6–5.1)
Alkaline phosphatase (APISO): 72 U/L (ref 36–130)
BUN: 9 mg/dL (ref 7–25)
CO2: 34 mmol/L — ABNORMAL HIGH (ref 20–32)
Calcium: 9 mg/dL (ref 8.6–10.3)
Chloride: 104 mmol/L (ref 98–110)
Creat: 1.21 mg/dL (ref 0.60–1.26)
Globulin: 3.3 g/dL (calc) (ref 1.9–3.7)
Glucose, Bld: 98 mg/dL (ref 65–99)
Potassium: 3.6 mmol/L (ref 3.5–5.3)
Sodium: 141 mmol/L (ref 135–146)
Total Bilirubin: 0.4 mg/dL (ref 0.2–1.2)
Total Protein: 7.5 g/dL (ref 6.1–8.1)
eGFR: 82 mL/min/{1.73_m2} (ref >=60)

## 2023-03-24 LAB — HIV-1 RNA QUANT-NO REFLEX-BLD
HIV 1 RNA Quant: NOT DETECTED Copies/mL
HIV-1 RNA Quant, Log: NOT DETECTED Log cps/mL

## 2023-03-24 LAB — LIPID PANEL
Cholesterol: 168 mg/dL (ref <200)
HDL: 45 mg/dL (ref >=40)
LDL Cholesterol (Calc): 96 mg/dL (calc)
Non-HDL Cholesterol (Calc): 123 mg/dL (calc) (ref <130)
Total CHOL/HDL Ratio: 3.7 (calc) (ref <5.0)
Triglycerides: 173 mg/dL — ABNORMAL HIGH (ref <150)

## 2023-03-24 MED ORDER — SULFAMETHOXAZOLE-TRIMETHOPRIM 400-80 MG PO TABS
1.0000 | ORAL_TABLET | Freq: Every day | ORAL | 11 refills | Status: DC
  Filled 2023-03-24: qty 30, 30d supply, fill #0
  Filled 2023-05-11: qty 30, 30d supply, fill #1
  Filled 2023-05-31: qty 30, 30d supply, fill #2
  Filled 2023-06-28: qty 30, 30d supply, fill #3
  Filled 2023-07-28: qty 30, 30d supply, fill #4

## 2023-03-24 NOTE — Progress Notes (Signed)
Please call Aaron Lee and his father to let them both know that his viral load is undetectable and his immune system levels are the highest they have been in YEARS!!   Please continue the Prezcobix and Tivicay and preventative antibiotic, bactrim.

## 2023-03-24 NOTE — Telephone Encounter (Signed)
-----   Message from Blanchard Kelch, NP sent at 03/24/2023  4:49 PM EDT ----- Please call Aaron Lee and his father to let them both know that his viral load is undetectable and his immune system levels are the highest they have been in YEARS!!   Please continue the Prezcobix and Tivicay and preventative antibiotic, bactrim.

## 2023-03-24 NOTE — Addendum Note (Signed)
Addended by: Blanchard Kelch on: 03/24/2023 04:50 PM   Modules accepted: Orders

## 2023-03-24 NOTE — Telephone Encounter (Signed)
Spoke with Aaron Lee and relayed that his viral load is undetectable and CD4 count is 110 which is the highest it's been in quite some time. Encouraged him to keep up the good work with taking his Prezcobix, Tivicay, and Bactrim everyday.   Spoke with his father, [name], as well. He was very pleased to hear this news and will continue to encourage [name] to stay on track with his medications.   Sandie Ano, RN

## 2023-03-26 ENCOUNTER — Other Ambulatory Visit: Payer: Self-pay

## 2023-03-31 ENCOUNTER — Other Ambulatory Visit: Payer: Self-pay | Admitting: Infectious Diseases

## 2023-03-31 ENCOUNTER — Other Ambulatory Visit (HOSPITAL_COMMUNITY): Payer: Self-pay

## 2023-03-31 ENCOUNTER — Other Ambulatory Visit: Payer: Self-pay

## 2023-03-31 DIAGNOSIS — B20 Human immunodeficiency virus [HIV] disease: Secondary | ICD-10-CM

## 2023-03-31 MED ORDER — PREZCOBIX 800-150 MG PO TABS
1.0000 | ORAL_TABLET | Freq: Every day | ORAL | 5 refills | Status: DC
  Filled 2023-03-31: qty 30, 30d supply, fill #0
  Filled 2023-05-11: qty 30, 30d supply, fill #1
  Filled 2023-05-31: qty 30, 30d supply, fill #2
  Filled 2023-06-28: qty 30, 30d supply, fill #3
  Filled 2023-07-28: qty 30, 30d supply, fill #4

## 2023-03-31 MED ORDER — TIVICAY 50 MG PO TABS
50.0000 mg | ORAL_TABLET | Freq: Every day | ORAL | 5 refills | Status: DC
  Filled 2023-03-31: qty 30, 30d supply, fill #0
  Filled 2023-05-11: qty 30, 30d supply, fill #1
  Filled 2023-05-31: qty 30, 30d supply, fill #2
  Filled 2023-06-28: qty 30, 30d supply, fill #3
  Filled 2023-07-28: qty 30, 30d supply, fill #4

## 2023-04-02 ENCOUNTER — Other Ambulatory Visit (HOSPITAL_COMMUNITY): Payer: Self-pay

## 2023-04-06 ENCOUNTER — Other Ambulatory Visit (HOSPITAL_COMMUNITY): Payer: Self-pay

## 2023-04-07 ENCOUNTER — Other Ambulatory Visit: Payer: Self-pay

## 2023-04-07 ENCOUNTER — Other Ambulatory Visit (HOSPITAL_COMMUNITY): Payer: Self-pay

## 2023-04-08 ENCOUNTER — Other Ambulatory Visit (HOSPITAL_COMMUNITY): Payer: Self-pay

## 2023-05-11 ENCOUNTER — Other Ambulatory Visit (HOSPITAL_COMMUNITY): Payer: Self-pay

## 2023-05-31 ENCOUNTER — Other Ambulatory Visit (HOSPITAL_COMMUNITY): Payer: Self-pay

## 2023-05-31 ENCOUNTER — Other Ambulatory Visit: Payer: Self-pay

## 2023-06-03 ENCOUNTER — Other Ambulatory Visit: Payer: Self-pay

## 2023-06-03 ENCOUNTER — Other Ambulatory Visit (HOSPITAL_COMMUNITY): Payer: Self-pay

## 2023-06-22 ENCOUNTER — Other Ambulatory Visit (HOSPITAL_COMMUNITY): Payer: Self-pay

## 2023-06-24 ENCOUNTER — Other Ambulatory Visit (HOSPITAL_COMMUNITY): Payer: Self-pay

## 2023-06-28 ENCOUNTER — Other Ambulatory Visit: Payer: Self-pay

## 2023-06-28 NOTE — Progress Notes (Signed)
Specialty Pharmacy Ongoing Clinical Assessment Note  Aaron Lee is a 32 y.o. male who is being followed by the specialty pharmacy service for RxSp HIV   Patient's specialty medication(s) reviewed today: Darunavir-Cobicistat; Dolutegravir Sodium; Sulfamethoxazole-Trimethoprim   Missed doses in the last 4 weeks: 0   Patient did not have any additional questions or concerns.   Therapeutic benefit summary: Patient is achieving benefit   Adverse events/side effects summary: No adverse events/side effects   Patient's therapy is appropriate to: Continue    Goals Addressed             This Visit's Progress    Achieve Undetectable HIV Viral Load < 20       Patient is on track. Patient will maintain adherence         Follow up:  6 months

## 2023-06-28 NOTE — Progress Notes (Signed)
Specialty Pharmacy Refill Coordination Note  Aaron Lee is a 32 y.o. male contacted today regarding refills of specialty medication(s) Darunavir-Cobicistat; Dolutegravir Sodium; Sulfamethoxazole-Trimethoprim   Patient requested Delivery   Delivery date: 07/08/23   Verified address: [address]   Medication will be filled on 07/07/23.

## 2023-07-12 ENCOUNTER — Ambulatory Visit: Payer: Medicaid Other | Admitting: Infectious Diseases

## 2023-07-12 ENCOUNTER — Telehealth: Payer: Self-pay

## 2023-07-12 NOTE — Telephone Encounter (Signed)
Attempted to reach patient on all numbers in chart and call could not be completed at this time. Need to reschedule No Show from 07/12/23 with Rexene Alberts.

## 2023-07-22 ENCOUNTER — Other Ambulatory Visit (HOSPITAL_COMMUNITY): Payer: Self-pay

## 2023-07-28 ENCOUNTER — Other Ambulatory Visit (HOSPITAL_COMMUNITY): Payer: Self-pay

## 2023-07-28 ENCOUNTER — Other Ambulatory Visit: Payer: Self-pay

## 2023-07-28 NOTE — Progress Notes (Signed)
Specialty Pharmacy Refill Coordination Note  Aaron Lee is a 32 y.o. male contacted today regarding refills of specialty medication(s) Darunavir-Cobicistat; Dolutegravir Sodium   Patient requested Delivery   Delivery date: 08/06/23   Verified address: [address] [place], [address]   Medication will be filled on 08/05/23.   Shipping Bactrim as well.

## 2023-08-05 ENCOUNTER — Other Ambulatory Visit: Payer: Self-pay

## 2023-08-06 ENCOUNTER — Other Ambulatory Visit: Payer: Self-pay

## 2023-08-30 ENCOUNTER — Other Ambulatory Visit (HOSPITAL_COMMUNITY): Payer: Self-pay

## 2023-09-06 ENCOUNTER — Other Ambulatory Visit: Payer: Self-pay

## 2023-09-06 ENCOUNTER — Ambulatory Visit: Payer: Medicaid Other | Admitting: Infectious Diseases

## 2023-09-06 ENCOUNTER — Encounter: Payer: Self-pay | Admitting: Infectious Diseases

## 2023-09-06 VITALS — BP 111/71 | HR 72 | Temp 98.3°F | Ht 63.0 in | Wt 110.0 lb

## 2023-09-06 DIAGNOSIS — B20 Human immunodeficiency virus [HIV] disease: Secondary | ICD-10-CM | POA: Diagnosis not present

## 2023-09-06 DIAGNOSIS — Z23 Encounter for immunization: Secondary | ICD-10-CM | POA: Diagnosis not present

## 2023-09-06 DIAGNOSIS — Z8619 Personal history of other infectious and parasitic diseases: Secondary | ICD-10-CM

## 2023-09-06 MED ORDER — SULFAMETHOXAZOLE-TRIMETHOPRIM 400-80 MG PO TABS
1.0000 | ORAL_TABLET | Freq: Every day | ORAL | 11 refills | Status: DC
  Filled 2023-09-06 – 2023-09-07 (×2): qty 30, 30d supply, fill #0
  Filled 2023-10-05: qty 30, 30d supply, fill #1
  Filled 2023-10-26: qty 30, 30d supply, fill #2
  Filled 2023-11-23: qty 30, 30d supply, fill #3
  Filled 2023-12-31: qty 30, 30d supply, fill #4
  Filled 2024-02-04: qty 30, 30d supply, fill #5
  Filled 2024-03-03 – 2024-03-08 (×2): qty 30, 30d supply, fill #6
  Filled 2024-03-28 – 2024-03-31 (×2): qty 30, 30d supply, fill #7
  Filled 2024-04-26: qty 30, 30d supply, fill #8
  Filled 2024-05-31: qty 30, 30d supply, fill #9

## 2023-09-06 MED ORDER — TIVICAY 50 MG PO TABS
50.0000 mg | ORAL_TABLET | Freq: Every day | ORAL | 11 refills | Status: DC
  Filled 2023-09-06 – 2023-09-07 (×2): qty 30, 30d supply, fill #0
  Filled 2023-10-05: qty 30, 30d supply, fill #1
  Filled 2023-10-26: qty 30, 30d supply, fill #2
  Filled 2023-11-23: qty 30, 30d supply, fill #3
  Filled 2023-12-31: qty 30, 30d supply, fill #4
  Filled 2024-02-04: qty 30, 30d supply, fill #5
  Filled 2024-03-03 – 2024-03-08 (×3): qty 30, 30d supply, fill #6
  Filled 2024-03-28 – 2024-03-31 (×2): qty 30, 30d supply, fill #7
  Filled 2024-04-26: qty 30, 30d supply, fill #8
  Filled 2024-05-19: qty 30, 30d supply, fill #9
  Filled 2024-06-21 – 2024-06-22 (×2): qty 30, 30d supply, fill #10

## 2023-09-06 MED ORDER — PREZCOBIX 800-150 MG PO TABS
1.0000 | ORAL_TABLET | Freq: Every day | ORAL | 11 refills | Status: DC
  Filled 2023-09-06 – 2023-09-07 (×2): qty 30, 30d supply, fill #0
  Filled 2023-10-05: qty 30, 30d supply, fill #1
  Filled 2023-10-26: qty 30, 30d supply, fill #2
  Filled 2023-11-23: qty 30, 30d supply, fill #3
  Filled 2023-12-31: qty 30, 30d supply, fill #4
  Filled 2024-02-04: qty 30, 30d supply, fill #5
  Filled 2024-03-03 – 2024-03-08 (×2): qty 30, 30d supply, fill #6
  Filled 2024-03-28 – 2024-03-31 (×2): qty 30, 30d supply, fill #7
  Filled 2024-04-26: qty 30, 30d supply, fill #8
  Filled 2024-05-19: qty 30, 30d supply, fill #9
  Filled 2024-06-21 – 2024-06-22 (×2): qty 30, 30d supply, fill #10

## 2023-09-06 NOTE — Progress Notes (Signed)
u      Name: Aaron Lee  DOB: 05-12-1991  MRN: [id number]  PCP: Patient, No Pcp Per     SUBJECTIVE: Aaron Lee is a 32 y.o. male with HIV and AIDS in November 2018 with CD4 nadir < 10, VL 192,000 copies.  HIV Risk: MSM/sexual   OI Hx: shingles  Previous Regimens:  Biktarvy --> suppressed (changed to PI for adherence concern) Symtuza 2020 [facility] Dec 2022 --> failed with Y181C   Genotype:  07/2017 - K103S (R-nevirapine) 05-2019 - K103S, no integrase mutations  10/2019 - K103S, no integrase mutations.  10/2021 - K70KT, K103S, V106VAIT, Y181C, R211K (see phone note for cumulative genotype)    Chief Complaint  Patient presents with   Follow-up     HPI: Here today for HIV follow up care. Has been doing well since LOV and no complaints. He states he has been getting his Prezcobix and Tivicay in everyday along with his Bactrim. He has not had any problems with his eczema.  He assures me his adherence has continued to be better living with his father.    Review of Systems  All other systems reviewed and are negative.    Past Medical History:  Diagnosis Date   Eczema    HIV (human immunodeficiency virus infection) (HCC)    Shingles outbreak 09/26/2018    Social History   Tobacco Use   Smoking status: Light Smoker    Current packs/day: 0.10    Types: Cigarettes   Smokeless tobacco: Never  Substance Use Topics   Alcohol use: Yes    Comment: occ   Drug use: Yes    Frequency: 7.0 times per week    Types: Marijuana    No Known Allergies   Outpatient Medications Prior to Visit  Medication Sig Dispense Refill   darunavir-cobicistat (PREZCOBIX) 800-150 MG tablet Take 1 tablet by mouth daily. 30 tablet 5   dolutegravir (TIVICAY) 50 MG tablet Take 1 tablet (50 mg total) by mouth daily. 30 tablet 5   sulfamethoxazole-trimethoprim (BACTRIM) 400-80 MG tablet Take 1 tablet by mouth daily. 30 tablet 11   No facility-administered medications prior to visit.            Objective: Vitals:   09/06/23 1543  BP: 111/71  Pulse: 72  Temp: 98.3 F (36.8 C)  SpO2: 100%   Wt Readings from Last 3 Encounters:  09/06/23 110 lb (49.9 kg)  03/22/23 111 lb (50.3 kg)  12/28/22 108 lb (49 kg)    Physical Exam Vitals reviewed.  Constitutional:      Appearance: Normal appearance. He is not ill-appearing.  HENT:     Head: Normocephalic.     Mouth/Throat:     Mouth: Mucous membranes are moist.     Pharynx: Oropharynx is clear.  Eyes:     General: No scleral icterus. Cardiovascular:     Rate and Rhythm: Normal rate and regular rhythm.  Pulmonary:     Effort: Pulmonary effort is normal.  Musculoskeletal:        General: Normal range of motion.     Cervical back: Normal range of motion.  Skin:    Coloration: Skin is not jaundiced or pale.  Neurological:     Mental Status: He is alert and oriented to person, place, and time.  Psychiatric:        Mood and Affect: Mood normal.        Judgment: Judgment normal.     Lab Results Lab Results  Component Value Date   WBC 3.1 (L) 03/22/2023   HGB 13.5 03/22/2023   HCT 41.2 03/22/2023   MCV 87.3 03/22/2023   PLT 82 (L) 03/22/2023    Lab Results  Component Value Date   CREATININE 1.21 03/22/2023   BUN 9 03/22/2023   NA 141 03/22/2023   K 3.6 03/22/2023   CL 104 03/22/2023   CO2 34 (H) 03/22/2023    Lab Results  Component Value Date   ALT 18 03/22/2023   AST 19 03/22/2023   ALKPHOS 200 (H) 12/20/2022   BILITOT 0.4 03/22/2023    HIV 1 RNA Quant (Copies/mL)  Date Value  03/22/2023 Not Detected  12/28/2022 1,060 (H)  09/28/2022 57 (H)   CD4 T Cell Abs (/uL)  Date Value  03/22/2023 110 (L)  12/28/2022 91 (L)  07/15/2022 104 (L)    Assessment and Plan: Problem List Items Addressed This Visit       High   AIDS (acquired immune deficiency syndrome) (HCC) - Primary (Chronic)   Will check VL and CD4 to ensure adherence has been on point. His fill history agrees with his  reported adherence.  Return in about 3 months (around 12/05/2023).       Relevant Medications   darunavir-cobicistat (PREZCOBIX) 800-150 MG tablet   dolutegravir (TIVICAY) 50 MG tablet   sulfamethoxazole-trimethoprim (BACTRIM) 400-80 MG tablet   Other Relevant Orders   HIV 1 RNA quant-no reflex-bld   T-helper cells (CD4) count   RPR   AMB REFERRAL TO COMMUNITY SERVICE AGENCY   Flu vaccine trivalent PF, 6mos and older(Flulaval,Afluria,Fluarix,Fluzone) (Completed)   Pfizer Comirnaty Covid-19 Vaccine 74yrs & older (Completed)   HIV disease (HCC) (Chronic)   Relevant Medications   darunavir-cobicistat (PREZCOBIX) 800-150 MG tablet   dolutegravir (TIVICAY) 50 MG tablet   sulfamethoxazole-trimethoprim (BACTRIM) 400-80 MG tablet   Other Relevant Orders   AMB REFERRAL TO COMMUNITY SERVICE AGENCY     Unprioritized   History of syphilis   Repeat RPR today.Declined other sti testing.       Other Visit Diagnoses       Need for influenza vaccination       Relevant Orders   Flu vaccine trivalent PF, 6mos and older(Flulaval,Afluria,Fluarix,Fluzone) (Completed)     Need for COVID-19 vaccine       Relevant Orders   Pfizer Comirnaty Covid-19 Vaccine 16yrs & older (Completed)       Rexene Alberts, MSN, NP-C [facility]  [email or web address] Pager: [phone] Office: [phone] [facility] Main Line: [phone]  09/06/2023

## 2023-09-06 NOTE — Assessment & Plan Note (Signed)
Will check VL and CD4 to ensure adherence has been on point. His fill history agrees with his reported adherence.  Return in about 3 months (around 12/05/2023).

## 2023-09-06 NOTE — Assessment & Plan Note (Signed)
Repeat RPR today.Declined other sti testing.

## 2023-09-06 NOTE — Patient Instructions (Signed)
Please continue your three pills each once a day together with food. Refills sent in.   Please come back in 3 months to check in

## 2023-09-07 ENCOUNTER — Other Ambulatory Visit: Payer: Self-pay

## 2023-09-07 LAB — T-HELPER CELLS (CD4) COUNT (NOT AT ARMC)
CD4 % Helper T Cell: 15 % — ABNORMAL LOW (ref 33–65)
CD4 T Cell Abs: 123 /uL — ABNORMAL LOW (ref 400–1790)

## 2023-09-07 NOTE — Progress Notes (Signed)
Specialty Pharmacy Refill Coordination Note  Aaron Lee is a 32 y.o. male contacted today regarding refills of specialty medication(s) Darunavir-Cobicistat (Prezcobix); Dolutegravir Sodium (Tivicay)   Patient requested Delivery   Delivery date: 09/08/23   Verified address: [address] [place], [address]   Medication will be filled on 12.17.24.

## 2023-09-08 LAB — HIV-1 RNA QUANT-NO REFLEX-BLD
HIV 1 RNA Quant: 136 {copies}/mL — ABNORMAL HIGH
HIV-1 RNA Quant, Log: 2.13 {Log} — ABNORMAL HIGH

## 2023-09-08 LAB — RPR TITER: RPR Titer: 1:2 {titer} — ABNORMAL HIGH

## 2023-09-08 LAB — RPR: RPR Ser Ql: REACTIVE — AB

## 2023-09-08 LAB — T PALLIDUM AB: T Pallidum Abs: POSITIVE — AB

## 2023-09-10 ENCOUNTER — Telehealth: Payer: Self-pay

## 2023-09-10 NOTE — Telephone Encounter (Signed)
Patient aware and voiced his understanding.   Ryland Tungate P Chirsty Armistead, CMA  

## 2023-09-10 NOTE — Telephone Encounter (Signed)
-----   Message from [place] sent at 09/10/2023 10:21 AM EST ----- Please call Myran to let him know that his viral load is in the undetectable range at 136 copies. Just make sure he takes both the pink pill and the yellow pill together with enough food everyday so it absorbs well.   No changes otherwise. His immune system continues to be low but no worse which is great. Please continue the antibiotic pill also.

## 2023-10-05 ENCOUNTER — Other Ambulatory Visit: Payer: Self-pay

## 2023-10-05 NOTE — Progress Notes (Signed)
 Specialty Pharmacy Refill Coordination Note  Aaron Lee is a 33 y.o. male contacted today regarding refills of specialty medication(s) Dolutegravir  Sodium (Tivicay ); Darunavir -Cobicistat  (Prezcobix )   Patient requested Delivery   Delivery date: 10/07/23   Verified address: [address]   [place] [address]   Medication will be filled on 10/06/23.

## 2023-10-06 ENCOUNTER — Other Ambulatory Visit: Payer: Self-pay

## 2023-10-26 ENCOUNTER — Other Ambulatory Visit: Payer: Self-pay

## 2023-10-26 NOTE — Progress Notes (Signed)
 Specialty Pharmacy Refill Coordination Note  Aaron Lee is a 33 y.o. male contacted today regarding refills of specialty medication(s) Darunavir -Cobicistat  (Prezcobix ); Dolutegravir  Sodium (Tivicay )   Patient requested Delivery   Delivery date: 11/03/23   Verified address: [address]   [place] [address]   Medication will be filled on 11/02/23.

## 2023-11-02 ENCOUNTER — Other Ambulatory Visit: Payer: Self-pay

## 2023-11-23 ENCOUNTER — Other Ambulatory Visit: Payer: Self-pay

## 2023-11-23 ENCOUNTER — Other Ambulatory Visit: Payer: Self-pay | Admitting: Pharmacy Technician

## 2023-11-23 NOTE — Progress Notes (Signed)
 Specialty Pharmacy Refill Coordination Note  Aaron Lee is a 33 y.o. male contacted today regarding refills of specialty medication(s) Darunavir-Cobicistat (Prezcobix); Dolutegravir Sodium (Tivicay)  Spoke with Patient & Dad  Patient requested Delivery   Delivery date: 12/13/23   Verified address: [address]  [place]   Medication will be filled on 12/10/23.

## 2023-12-02 ENCOUNTER — Other Ambulatory Visit: Payer: Self-pay

## 2023-12-10 ENCOUNTER — Other Ambulatory Visit: Payer: Self-pay

## 2023-12-21 ENCOUNTER — Other Ambulatory Visit: Payer: Self-pay

## 2023-12-21 ENCOUNTER — Ambulatory Visit (INDEPENDENT_AMBULATORY_CARE_PROVIDER_SITE_OTHER): Payer: Medicaid Other | Admitting: Infectious Diseases

## 2023-12-21 ENCOUNTER — Encounter: Payer: Self-pay | Admitting: Infectious Diseases

## 2023-12-21 VITALS — BP 108/73 | HR 80 | Temp 98.6°F | Ht 63.0 in | Wt 110.0 lb

## 2023-12-21 DIAGNOSIS — Z23 Encounter for immunization: Secondary | ICD-10-CM

## 2023-12-21 DIAGNOSIS — J302 Other seasonal allergic rhinitis: Secondary | ICD-10-CM

## 2023-12-21 DIAGNOSIS — B20 Human immunodeficiency virus [HIV] disease: Secondary | ICD-10-CM

## 2023-12-21 NOTE — Assessment & Plan Note (Signed)
 Recommended OTC allergy medication to treat symptoms. Provided images for eye drops and PO options for ease of purchase at pharmacy.

## 2023-12-21 NOTE — Progress Notes (Signed)
 I agree with the assessment and plan as outlined below with the following additions:   Adherence reportedly been good since last office visit. Will continue q 47m VL checks to ensure adherence discussions with Gu[place] and family support system. He may be a candidate for cabotegrivir + lencapivir injections going forward, will have to discuss further with pharmacy team.   Prevnar 20 today for vaccine upkeep  Will work on Shingrix vaccine for him next visit.   Declined sexual health needs    Rexene Alberts, MSN, NP-C [facility]  [email or web address] Pager: [phone] Office: [phone] [facility] Main Line: [phone] *Secure Chat Communication Welcome

## 2023-12-21 NOTE — Progress Notes (Signed)
 u      Name: Aaron Lee  DOB: 1991-01-19  MRN: [id number]  PCP: Patient, No Pcp Per     SUBJECTIVE: Aaron Lee is a 33 y.o. male with HIV and AIDS in November 2018 with CD4 nadir < 10, VL 192,000 copies.  HIV Risk: MSM/sexual   OI Hx: shingles  Previous Regimens:  Biktarvy --> suppressed (changed to PI for adherence concern) Symtuza 2020 [facility] Dec 2022 --> failed with Y181C   Genotype:  07/2017 - K103S (R-nevirapine) 05-2019 - K103S, no integrase mutations  10/2019 - K103S, no integrase mutations.  10/2021 - K70KT, K103S, V106VAIT, Y181C, R211K (see phone note for cumulative genotype)    Chief Complaint  Patient presents with   Follow-up     HPI: Here today for HIV follow up care. Has been doing well since LOV and no complaints. He states he has been getting his Prezcobix and Tivicay in everyday along with his Bactrim. He has not had any problems with his eczema. He does present with seasonal allergy symptoms. He is still living with his dad.   Review of Systems  Constitutional: Negative.   HENT:  Positive for congestion.   Eyes:  Positive for redness and itching.  Endocrine: Negative.   Genitourinary: Negative.   Musculoskeletal: Negative.   Skin: Negative.   Allergic/Immunologic: Positive for environmental allergies and immunocompromised state.  Neurological: Negative.   Psychiatric/Behavioral:         Sitting comfortably and engaged in care discussion  All other systems reviewed and are negative.    Past Medical History:  Diagnosis Date   Eczema    HIV (human immunodeficiency virus infection) (HCC)    Shingles outbreak 09/26/2018    Social History   Tobacco Use   Smoking status: Light Smoker    Current packs/day: 0.10    Types: Cigarettes   Smokeless tobacco: Never  Substance Use Topics   Alcohol use: Yes    Comment: occ   Drug use: Yes    Frequency: 7.0 times per week    Types: Marijuana    No Known Allergies   Outpatient  Medications Prior to Visit  Medication Sig Dispense Refill   darunavir-cobicistat (PREZCOBIX) 800-150 MG tablet Take 1 tablet by mouth daily. 30 tablet 11   dolutegravir (TIVICAY) 50 MG tablet Take 1 tablet (50 mg total) by mouth daily. 30 tablet 11   sulfamethoxazole-trimethoprim (BACTRIM) 400-80 MG tablet Take 1 tablet by mouth daily. 30 tablet 11   No facility-administered medications prior to visit.           Objective: Vitals:   12/21/23 1528  BP: 108/73  Pulse: 80  Temp: 98.6 F (37 C)  SpO2: 99%   Wt Readings from Last 3 Encounters:  12/21/23 110 lb (49.9 kg)  09/06/23 110 lb (49.9 kg)  03/22/23 111 lb (50.3 kg)    Physical Exam Vitals reviewed.  Constitutional:      Appearance: Normal appearance. He is not ill-appearing.  HENT:     Head: Normocephalic.     Nose: Congestion present.     Mouth/Throat:     Mouth: Mucous membranes are moist.     Pharynx: Oropharynx is clear.  Eyes:     General: No scleral icterus.    Comments: Conjunctivae red - bilaterally   Cardiovascular:     Rate and Rhythm: Normal rate and regular rhythm.  Pulmonary:     Effort: Pulmonary effort is normal.  Musculoskeletal:  General: Normal range of motion.     Cervical back: Normal range of motion.  Skin:    Coloration: Skin is not jaundiced or pale.  Neurological:     Mental Status: He is alert and oriented to person, place, and time.  Psychiatric:        Mood and Affect: Mood normal.        Judgment: Judgment normal.     Lab Results Lab Results  Component Value Date   WBC 3.1 (L) 03/22/2023   HGB 13.5 03/22/2023   HCT 41.2 03/22/2023   MCV 87.3 03/22/2023   PLT 82 (L) 03/22/2023    Lab Results  Component Value Date   CREATININE 1.21 03/22/2023   BUN 9 03/22/2023   NA 141 03/22/2023   K 3.6 03/22/2023   CL 104 03/22/2023   CO2 34 (H) 03/22/2023    Lab Results  Component Value Date   ALT 18 03/22/2023   AST 19 03/22/2023   ALKPHOS 200 (H) 12/20/2022    BILITOT 0.4 03/22/2023    HIV 1 RNA Quant (Copies/mL)  Date Value  09/06/2023 136 (H)  03/22/2023 Not Detected  12/28/2022 1,060 (H)   CD4 T Cell Abs (/uL)  Date Value  09/06/2023 123 (L)  03/22/2023 110 (L)  12/28/2022 91 (L)    Assessment and Plan: Problem List Items Addressed This Visit       Respiratory   Seasonal allergic rhinitis   Recommended OTC allergy medication to treat symptoms. Provided images for eye drops and PO options for ease of purchase at pharmacy.         Other   AIDS (acquired immune deficiency syndrome) (HCC) - Primary (Chronic)   Continue daily Prezcobix, Tivicay for HIV and Bactrim for PJP/Toxo prophylaxis. Will recheck VL and Cd4 today. Discussed possibility of alternative treatment options in the future but to continue to take current regimen every day. He would be a good candidate for once weekly regimen or future injectable options due to inconsistent adherence.   Given Prevnar 20 today.       HIV disease (HCC) (Chronic)   Relevant Orders   HIV 1 RNA quant-no reflex-bld   T-helper cells (CD4) count   Other Visit Diagnoses       Need for pneumococcal 20-valent conjugate vaccination       Relevant Orders   Pneumococcal conjugate vaccine 20-valent (Completed)      Sanuel Ladnier, RN, MPH Family Nurse Practitioner Student [employer]    12/21/2023

## 2023-12-21 NOTE — Patient Instructions (Addendum)
 Please continue your Bactrim, Prezcobix and Tivicay together everyday with food   Allergy Drops for your eyes -    Allergy medication over the counter will help you tolerate the pollen better - can take once a day with your other medication     Just don't take any Flonase

## 2023-12-21 NOTE — Assessment & Plan Note (Signed)
 Continue daily Prezcobix, Tivicay for HIV and Bactrim for PJP/Toxo prophylaxis. Will recheck VL and Cd4 today. Discussed possibility of alternative treatment options in the future but to continue to take current regimen every day. He would be a good candidate for once weekly regimen or future injectable options due to inconsistent adherence.   Given Prevnar 20 today.

## 2023-12-22 LAB — T-HELPER CELLS (CD4) COUNT (NOT AT ARMC)
CD4 % Helper T Cell: 18 % — ABNORMAL LOW (ref 33–65)
CD4 T Cell Abs: 119 /uL — ABNORMAL LOW (ref 400–1790)

## 2023-12-23 LAB — HIV-1 RNA QUANT-NO REFLEX-BLD
HIV 1 RNA Quant: 1550 {copies}/mL — ABNORMAL HIGH
HIV-1 RNA Quant, Log: 3.19 {Log_copies}/mL — ABNORMAL HIGH

## 2023-12-27 ENCOUNTER — Telehealth: Payer: Self-pay

## 2023-12-27 NOTE — Telephone Encounter (Signed)
 Spoke with Aaron Lee, discussed elevated viral load. He reports good adherence to his medications. Reminded him to take Tivicay and Prezcobix together every day with food for better absorption.   Spoke with Aaron Lee, Aaron Lee. Relayed that viral load is elevated at 1500. He reports Caster has been missing a few doses, but that he's been working with him to improve his adherence. Reminded him that Tivicay and Prezcobix should be taken together daily with food and that suppressing Jennie's viral load is important to make sure that injections remain an option for him in the future.   Linna Hoff, BSN, RN

## 2023-12-27 NOTE — Telephone Encounter (Signed)
-----   Message from [place] sent at 12/27/2023 12:42 PM EDT ----- Please give [name] a call (and his father too) to let them know his viral load is 1500  Would make sure they are ensuring he is getting his Tivicay + Prezcobix together everyday with food so it absorbs better.  Try to make sure to minimize skipped doses.   We may have some options for injectable options in the future so need to keep his HIV under the best control for now so these can be used if needed.   If they feel like Yamir has been doing very well and not missing any doses, we should get him back for repeated genotype testing to ensure medications are still active and working.   Thanks team.

## 2023-12-29 ENCOUNTER — Other Ambulatory Visit: Payer: Self-pay | Admitting: Pharmacist

## 2023-12-29 NOTE — Progress Notes (Signed)
 Specialty Pharmacy Ongoing Clinical Assessment Note  Aaron Lee is a 33 y.o. male who is being followed by the specialty pharmacy service for RxSp HIV   Patient's specialty medication(s) reviewed today: Darunavir-Cobicistat (Prezcobix); Dolutegravir Sodium (Tivicay)   Missed doses in the last 4 weeks: 3   Patient/Caregiver did not have any additional questions or concerns.   Therapeutic benefit summary: Patient is achieving benefit   Adverse events/side effects summary: No adverse events/side effects   Patient's therapy is appropriate to: Continue    Goals Addressed   None     Follow up:  3 months  [facility] Specialty Pharmacist

## 2023-12-31 ENCOUNTER — Other Ambulatory Visit: Payer: Self-pay

## 2023-12-31 NOTE — Progress Notes (Signed)
 Specialty Pharmacy Refill Coordination Note  Aaron Lee is a 33 y.o. male contacted today regarding refills of specialty medication(s) Darunavir-Cobicistat (Prezcobix); Dolutegravir Sodium (Tivicay)   Patient requested Delivery   Delivery date: 01/07/24   Verified address: [address]  [place]   Medication will be filled on 04.17.25.

## 2024-01-06 ENCOUNTER — Other Ambulatory Visit: Payer: Self-pay

## 2024-02-04 ENCOUNTER — Other Ambulatory Visit: Payer: Self-pay

## 2024-02-04 NOTE — Progress Notes (Signed)
 Specialty Pharmacy Refill Coordination Note  Aaron Lee is a 33 y.o. male contacted today regarding refills of specialty medication(s) Darunavir -Cobicistat  (Prezcobix ); Dolutegravir  Sodium (Tivicay )   Patient requested Delivery   Delivery date: 02/08/24   Verified address: [address]  [place]   Medication will be filled on 05.19.25.

## 2024-03-03 ENCOUNTER — Other Ambulatory Visit: Payer: Self-pay

## 2024-03-06 ENCOUNTER — Other Ambulatory Visit: Payer: Self-pay

## 2024-03-08 ENCOUNTER — Other Ambulatory Visit: Payer: Self-pay

## 2024-03-08 NOTE — Progress Notes (Signed)
 Specialty Pharmacy Refill Coordination Note  Aaron Lee is a 33 y.o. male contacted today regarding refills of specialty medication(s) Darunavir -Cobicistat  (Prezcobix ); Dolutegravir  Sodium (Tivicay )   Patient requested Delivery   Delivery date: 03/09/24   Verified address: [address]   [place] [address]   Medication will be filled on 03/08/24.

## 2024-03-15 ENCOUNTER — Other Ambulatory Visit: Payer: Self-pay

## 2024-03-15 NOTE — Progress Notes (Signed)
 Specialty Pharmacy Ongoing Clinical Assessment Note  [name] is a 33 y.o. male who is being followed by the specialty pharmacy service for RxSp HIV   Patient's specialty medication(s) reviewed today: Darunavir -Cobicistat  (Prezcobix ); Dolutegravir  Sodium (Tivicay )   Missed doses in the last 4 weeks: 0   Patient/Caregiver did not have any additional questions or concerns.   Therapeutic benefit summary: Patient is achieving benefit   Adverse events/side effects summary: No adverse events/side effects   Patient's therapy is appropriate to: Continue    Goals Addressed             This Visit's Progress    Achieve Undetectable HIV Viral Load < 20       Patient is not on track and worsening. Patient will work on increased adherence and adhere to provider and/or lab appointments. Most recent VL from 12/21/23 had increased to 1550. Provider reinforced adherence with patient and caregiver.           Follow up: 3 months  Aarna Mihalko M Dyane [facility] Specialty Pharmacist

## 2024-03-21 ENCOUNTER — Ambulatory Visit (INDEPENDENT_AMBULATORY_CARE_PROVIDER_SITE_OTHER): Admitting: Infectious Diseases

## 2024-03-21 ENCOUNTER — Other Ambulatory Visit: Payer: Self-pay

## 2024-03-21 ENCOUNTER — Encounter: Payer: Self-pay | Admitting: Infectious Diseases

## 2024-03-21 VITALS — BP 123/70 | HR 70 | Temp 98.3°F | Ht 63.0 in | Wt 107.0 lb

## 2024-03-21 DIAGNOSIS — Z113 Encounter for screening for infections with a predominantly sexual mode of transmission: Secondary | ICD-10-CM

## 2024-03-21 DIAGNOSIS — Z8619 Personal history of other infectious and parasitic diseases: Secondary | ICD-10-CM

## 2024-03-21 DIAGNOSIS — B20 Human immunodeficiency virus [HIV] disease: Secondary | ICD-10-CM

## 2024-03-21 MED ORDER — ZOSTER VAC RECOMB ADJUVANTED 50 MCG/0.5ML IM SUSR
0.5000 mL | Freq: Once | INTRAMUSCULAR | 0 refills | Status: AC
  Filled 2024-03-21: qty 1, 1d supply, fill #0

## 2024-03-21 MED ORDER — SHINGRIX 50 MCG/0.5ML IM SUSR: 0.5000 mL | Freq: Once | INTRAMUSCULAR | 0 refills | Status: AC

## 2024-03-21 NOTE — Patient Instructions (Addendum)
  No changes - please continue the bactrim , prezcobix  and tivicay  with food once a day  Please go to the pharmacy next door in this building to get your first shingles vaccine dose

## 2024-03-21 NOTE — Progress Notes (Signed)
 u      Name: Aaron Lee  DOB: July 27, 1991  MRN: [id number]  PCP: Patient, No Pcp Per     SUBJECTIVE: Aaron Lee is a 33 y.o. male with HIV and AIDS in November 2018 with CD4 nadir < 10, VL 192,000 copies.  HIV Risk: MSM/sexual   OI Hx: shingles  Previous Regimens:  Biktarvy  --> suppressed (changed to PI for adherence concern) Symtuza  2020 Cabenuva  Dec 2022 --> failed with Y181C   Genotype:  07/2017 - K103S (R-nevirapine) 05-2019 - K103S, no integrase mutations  10/2019 - K103S, no integrase mutations.  10/2021 - K70KT, K103S, V106VAIT, Y181C, R211K (see phone note for cumulative genotype)    No chief complaint on file.   Discussed the use of AI scribe software for clinical note transcription with the patient, who gave verbal consent to proceed.  History of Present Illness   Aaron Lee is a 33 year old male who presents for follow-up for HIV and viral load monitoring.   His viral load was elevated at 1500 copies during the last visit three months ago. He acknowledges missing some doses of his medication but has since improved adherence. He is currently taking three antiretroviral pills and reports no symptoms or complications.  He has a history of shingles, with a previous episode involving a painful rash on the chest.  No new sexual partners. Agrees to a urine screening for sexual health. No issues with allergies, sadness, or depression. He is not currently working and has no summer plans.        Review of Systems  All other systems reviewed and are negative.    Past Medical History:  Diagnosis Date   Eczema    HIV (human immunodeficiency virus infection) (HCC)    Shingles outbreak 09/26/2018    Social History   Tobacco Use   Smoking status: Light Smoker    Current packs/day: 0.10    Types: Cigarettes   Smokeless tobacco: Never  Substance Use Topics   Alcohol use: Yes    Comment: occ   Drug use: Yes    Frequency: 7.0 times per week     Types: Marijuana    No Known Allergies   Outpatient Medications Prior to Visit  Medication Sig Dispense Refill   darunavir -cobicistat  (PREZCOBIX ) 800-150 MG tablet Take 1 tablet by mouth daily. 30 tablet 11   dolutegravir  (TIVICAY ) 50 MG tablet Take 1 tablet (50 mg total) by mouth daily. 30 tablet 11   sulfamethoxazole -trimethoprim  (BACTRIM ) 400-80 MG tablet Take 1 tablet by mouth daily. 30 tablet 11   No facility-administered medications prior to visit.           Objective: Vitals:   03/21/24 1545  BP: 123/70  Pulse: 70  Temp: 98.3 F (36.8 C)  SpO2: 99%   Wt Readings from Last 3 Encounters:  03/21/24 107 lb (48.5 kg)  12/21/23 110 lb (49.9 kg)  09/06/23 110 lb (49.9 kg)    Physical Exam Vitals reviewed.  Constitutional:      Appearance: Normal appearance. He is not ill-appearing.  HENT:     Head: Normocephalic.     Mouth/Throat:     Mouth: Mucous membranes are moist.     Pharynx: Oropharynx is clear.   Eyes:     General: No scleral icterus.   Cardiovascular:     Rate and Rhythm: Normal rate and regular rhythm.  Pulmonary:     Effort: Pulmonary effort is normal.   Musculoskeletal:  General: Normal range of motion.     Cervical back: Normal range of motion.   Skin:    Coloration: Skin is not jaundiced or pale.   Neurological:     Mental Status: He is alert and oriented to person, place, and time.   Psychiatric:        Mood and Affect: Mood normal.        Judgment: Judgment normal.     Lab Results Lab Results  Component Value Date   WBC 3.1 (L) 03/22/2023   HGB 13.5 03/22/2023   HCT 41.2 03/22/2023   MCV 87.3 03/22/2023   PLT 82 (L) 03/22/2023    Lab Results  Component Value Date   CREATININE 1.21 03/22/2023   BUN 9 03/22/2023   NA 141 03/22/2023   K 3.6 03/22/2023   CL 104 03/22/2023   CO2 34 (H) 03/22/2023    Lab Results  Component Value Date   ALT 18 03/22/2023   AST 19 03/22/2023   ALKPHOS 200 (H) 12/20/2022   BILITOT 0.4  03/22/2023    HIV 1 RNA Quant  Date Value  12/21/2023 1,550 copies/mL (H)  09/06/2023 136 Copies/mL (H)  03/22/2023 Not Detected Copies/mL   CD4 T Cell Abs (/uL)  Date Value  12/21/2023 119 (L)  09/06/2023 123 (L)  03/22/2023 110 (L)    Assessment and Plan: Assessment and Plan    HIV Infection -  AIDS, CD4 ~115 -  Viral load was slightly elevated at 1500 copies/mL three months ago due to missed doses. He has since resumed adherence to his medication regimen. - Repeat viral load test today - CD4 q76m OK - Continue Bactrim  prophylaxis  - Continue Prezcobix  + Tivicay  together once daily with food   Sexual health screening - Agreed to urine screening for sexual health. No new partners reported. - Perform urine screening test today - RPR   Planned shingles vaccination - Previously experienced painful shingles rash on the chest. Shingles vaccine is recommended to prevent future occurrences and is available at the pharmacy for Medicaid coverage. - Order shingles vaccine - Instruct him to receive shingles vaccine at the pharmacy today. Second dose at follow up in 3 months      Orders Placed This Encounter  Procedures   HIV RNA, RTPCR W/R GT (RTI, PI,INT)   T-helper cells (CD4) count   COMPLETE METABOLIC PANEL WITHOUT GFR   CBC w/Diff   RPR   Meds ordered this encounter  Medications   Zoster Vaccine Adjuvanted [facility]) injection    Sig: Inject 0.5 mLs into the muscle once for 1 dose.    Dispense:  0.5 mL    Refill:  0      Corean Fireman, MSN, NP-C [facility] for Infectious Disease [facility]  [email or web address] Pager: [phone] Office: [phone] [facility] Main Line: [phone]  03/21/2024

## 2024-03-22 LAB — T-HELPER CELLS (CD4) COUNT (NOT AT ARMC)
CD4 % Helper T Cell: 15 % — ABNORMAL LOW (ref 33–65)
CD4 T Cell Abs: 118 /uL — ABNORMAL LOW (ref 400–1790)

## 2024-03-24 LAB — CBC WITH DIFFERENTIAL/PLATELET
Absolute Lymphocytes: 838 {cells}/uL — ABNORMAL LOW (ref 850–3900)
Absolute Monocytes: 310 {cells}/uL (ref 200–950)
Basophils Absolute: 40 {cells}/uL (ref 0–200)
Basophils Relative: 1.6 %
Eosinophils Absolute: 280 {cells}/uL (ref 15–500)
Eosinophils Relative: 11.2 %
HCT: 41.3 % (ref 38.5–50.0)
Hemoglobin: 13.4 g/dL (ref 13.2–17.1)
MCH: 28.6 pg (ref 27.0–33.0)
MCHC: 32.4 g/dL (ref 32.0–36.0)
MCV: 88.2 fL (ref 80.0–100.0)
MPV: 13 fL — ABNORMAL HIGH (ref 7.5–12.5)
Monocytes Relative: 12.4 %
Neutro Abs: 1033 {cells}/uL — ABNORMAL LOW (ref 1500–7800)
Neutrophils Relative %: 41.3 %
Platelets: 67 Thousand/uL — ABNORMAL LOW (ref 140–400)
RBC: 4.68 Million/uL (ref 4.20–5.80)
RDW: 14.1 % (ref 11.0–15.0)
Total Lymphocyte: 33.5 %
WBC: 2.5 Thousand/uL — ABNORMAL LOW (ref 3.8–10.8)

## 2024-03-24 LAB — COMPLETE METABOLIC PANEL WITHOUT GFR
AG Ratio: 1.4 (calc) (ref 1.0–2.5)
ALT: 26 U/L (ref 9–46)
AST: 26 U/L (ref 10–40)
Albumin: 4.2 g/dL (ref 3.6–5.1)
Alkaline phosphatase (APISO): 53 U/L (ref 36–130)
BUN/Creatinine Ratio: 6 (calc) (ref 6–22)
BUN: 9 mg/dL (ref 7–25)
CO2: 27 mmol/L (ref 20–32)
Calcium: 8.6 mg/dL (ref 8.6–10.3)
Chloride: 107 mmol/L (ref 98–110)
Creat: 1.58 mg/dL — ABNORMAL HIGH (ref 0.60–1.26)
Globulin: 3.1 g/dL (ref 1.9–3.7)
Glucose, Bld: 87 mg/dL (ref 65–99)
Potassium: 3.6 mmol/L (ref 3.5–5.3)
Sodium: 141 mmol/L (ref 135–146)
Total Bilirubin: 0.5 mg/dL (ref 0.2–1.2)
Total Protein: 7.3 g/dL (ref 6.1–8.1)

## 2024-03-24 LAB — RPR TITER: RPR Titer: 1:2 {titer} — ABNORMAL HIGH

## 2024-03-24 LAB — T PALLIDUM AB: T Pallidum Abs: POSITIVE — AB

## 2024-03-24 LAB — RPR: RPR Ser Ql: REACTIVE — AB

## 2024-03-24 LAB — HIV RNA, RTPCR W/R GT (RTI, PI,INT)
HIV 1 RNA Quant: <20 {copies}/mL — AB
HIV-1 RNA Quant, Log: <1.3 {Log_copies}/mL — AB

## 2024-03-27 ENCOUNTER — Ambulatory Visit: Payer: Self-pay | Admitting: Infectious Diseases

## 2024-03-27 NOTE — Telephone Encounter (Signed)
 Spoke with Aaron Lee, congratulated him on undetectable viral load and reviewed with him that he should be on Bactrim , Tivicay , and Prezcobix .   Discussed elevated kidney function. He does not take ibuprofen  regularly. Encouraged adequate hydration and notified him that we would recheck this for him at his next appointment. Patient verbalized understanding and has no further questions.   Aaron Lee, BSN, RN

## 2024-03-28 ENCOUNTER — Other Ambulatory Visit: Payer: Self-pay

## 2024-03-29 ENCOUNTER — Ambulatory Visit (HOSPITAL_COMMUNITY)
Admission: EM | Admit: 2024-03-29 | Discharge: 2024-03-29 | Disposition: A | Discharge status: Home / Self Care | Attending: Internal Medicine | Admitting: Internal Medicine

## 2024-03-29 ENCOUNTER — Encounter (HOSPITAL_COMMUNITY): Payer: Self-pay

## 2024-03-29 DIAGNOSIS — K047 Periapical abscess without sinus: Secondary | ICD-10-CM | POA: Diagnosis not present

## 2024-03-29 DIAGNOSIS — D849 Immunodeficiency, unspecified: Secondary | ICD-10-CM

## 2024-03-29 DIAGNOSIS — K029 Dental caries, unspecified: Secondary | ICD-10-CM | POA: Diagnosis not present

## 2024-03-29 DIAGNOSIS — K0889 Other specified disorders of teeth and supporting structures: Secondary | ICD-10-CM | POA: Diagnosis not present

## 2024-03-29 MED ORDER — LIDOCAINE VISCOUS HCL 2 % MT SOLN: 5.0000 mL | OROMUCOSAL | 0 refills | Status: DC | PRN

## 2024-03-29 MED ORDER — AMOXICILLIN-POT CLAVULANATE 875-125 MG PO TABS
1.0000 | ORAL_TABLET | Freq: Two times a day (BID) | ORAL | 0 refills | Status: DC
Start: 2024-03-29 — End: 2024-07-13

## 2024-03-29 NOTE — ED Provider Notes (Signed)
 [facility]    CSN: [id number] Arrival date & time: 03/29/24  1343      History   Chief Complaint Chief Complaint  Patient presents with   Dental Pain    HPI Aaron Lee is a 33 y.o. male.   Aaron Lee is a 33 y.o. male presenting for chief complaint of Dental Pain that started approximately 3 days ago.  Pain is localized to the right lower jaw and does not radiate to the right ear or the right neck.  No recent trauma or injury to the right mouth.  He has not seen any drainage from the teeth that are hurting and denies fever, chills, sore throat, nausea, vomiting, and recent antibiotic or steroid use.  History of HIV/AIDS, takes antiviral.  Follows with infectious disease.  He does not have dental insurance and does not receive routine dental exams/cleanings.  Taking Tylenol  with minimal relief of pain prior to arrival.   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 No pertinent surgical history.     Home Medications    Prior to Admission medications   Medication Sig Start Date End Date Taking? Authorizing Provider  amoxicillin -clavulanate (AUGMENTIN ) 875-125 MG tablet Take 1 tablet by mouth every 12 (twelve) hours. 03/29/24  Yes Enedelia Dorna HERO, FNP  lidocaine  (XYLOCAINE ) 2 % solution Use as directed 5 mLs in the mouth or throat every 4 (four) hours as needed for mouth pain. 03/29/24  Yes Enedelia Dorna HERO,  FNP  darunavir -cobicistat  (PREZCOBIX ) 800-150 MG tablet Take 1 tablet by mouth daily. Patient taking differently: Take 1 tablet by mouth daily. Still taking 09/06/23   Melvenia Corean SAILOR, NP  dolutegravir  (TIVICAY ) 50 MG tablet Take 1 tablet (50 mg total) by mouth daily. Patient taking differently: Take 50 mg by mouth daily. Still taking 09/06/23   Melvenia Corean SAILOR, NP  sulfamethoxazole -trimethoprim  (BACTRIM ) 400-80 MG tablet Take 1 tablet by mouth daily. 09/06/23   Melvenia Corean SAILOR, NP    Family History Family History  Problem Relation Age of Onset   Cancer Mother    Hypertension Mother     Social History Social History   Tobacco Use   Smoking status: Light Smoker    Current packs/day: 0.10    Types: Cigarettes   Smokeless tobacco: Never  Substance Use Topics   Alcohol use: Yes    Comment: occ   Drug use: Yes    Frequency: 7.0 times per week    Types: Marijuana     Allergies   Patient has no known allergies.   Review of Systems Review of Systems Per HPI  Physical Exam Triage Vital Signs ED Triage Vitals  Encounter Vitals Group     BP 03/29/24 1505 123/82     Girls Systolic BP Percentile --      Girls Diastolic BP Percentile --      Boys Systolic BP Percentile --  Boys Diastolic BP Percentile --      Pulse Rate 03/29/24 1505 64     Resp 03/29/24 1505 16     Temp 03/29/24 1505 97.7 F (36.5 C)     Temp Source 03/29/24 1505 Oral     SpO2 03/29/24 1505 99 %     Weight --      Height --      Head Circumference --      Peak Flow --      Pain Score 03/29/24 1506 7     Pain Loc --      Pain Education --      Exclude from Growth Chart --    No data found.  Updated Vital Signs BP 123/82 (BP Location: Left Arm)   Pulse 64   Temp 97.7 F (36.5 C) (Oral)   Resp 16   SpO2 99%   Visual Acuity Right Eye Distance:   Left Eye Distance:   Bilateral Distance:    Right Eye Near:   Left Eye Near:    Bilateral Near:     Physical Exam Vitals and  nursing note reviewed.  Constitutional:      Appearance: He is not ill-appearing or toxic-appearing.  HENT:     Head: Normocephalic and atraumatic.     Right Ear: Hearing, tympanic membrane, ear canal and external ear normal.     Left Ear: Hearing, tympanic membrane, ear canal and external ear normal.     Nose: Nose normal.     Mouth/Throat:     Lips: Pink.     Mouth: Mucous membranes are moist. No injury or oral lesions.     Dentition: Abnormal dentition. Does not have dentures. Dental tenderness and dental caries present. No gingival swelling, dental abscesses or gum lesions.     Tongue: No lesions.     Pharynx: Oropharynx is clear. Uvula midline. No pharyngeal swelling, oropharyngeal exudate, posterior oropharyngeal erythema, uvula swelling or postnasal drip.     Tonsils: No tonsillar exudate.     Comments: Multiple missing teeth of the right lower mouth.  Tender to palpation over the right external mandible. No overlying rash to the external skin. No signs of dental abscess. No trismus, maintaining secretions without difficulty.  Eyes:     General: Lids are normal. Vision grossly intact. Gaze aligned appropriately.     Extraocular Movements: Extraocular movements intact.     Conjunctiva/sclera: Conjunctivae normal.  Neck:     Trachea: Trachea and phonation normal.  Pulmonary:     Effort: Pulmonary effort is normal.  Musculoskeletal:     Cervical back: Neck supple.  Lymphadenopathy:     Cervical: No cervical adenopathy.  Skin:    General: Skin is warm and dry.     Capillary Refill: Capillary refill takes less than 2 seconds.     Findings: No rash.  Neurological:     General: No focal deficit present.     Mental Status: He is alert and oriented to person, place, and time. Mental status is at baseline.     Cranial Nerves: No dysarthria or facial asymmetry.  Psychiatric:        Mood and Affect: Mood normal.        Speech: Speech normal.        Behavior: Behavior normal.         Thought Content: Thought content normal.        Judgment: Judgment normal.      UC Treatments / Results  Labs (all labs ordered are listed, but only abnormal results are displayed) Labs Reviewed - No data to display  EKG   Radiology No results found.  Procedures Procedures (including critical care time)  Medications Ordered in UC Medications - No data to display  Initial Impression / Assessment and Plan / UC Course  I have reviewed the triage vital signs and the nursing notes.  Pertinent labs & imaging results that were available during my care of the patient were reviewed by me and considered in my medical decision making (see chart for details).   1. Dental infection, dental pain, infected dental caries, immunosuppression Evaluation suggests dental pain secondary to dental infection.   HEENT exam stable and without red flag signs indicating need for advanced imaging/further emergent workup. Patient is afebrile, nontoxic in appearance, and with hemodynamically stable vital signs.   Augmentin  antibiotic ordered. Most recent blood work reviewed from infectious disease visit shows intact renal/liver function. Recommend supportive care for symptomatic relief as outlined in AVS.   Information for low cost community dental resources provided.  Encouraged to follow-up with dentist for further management.   Counseled patient on potential for adverse effects with medications prescribed/recommended today, strict ER and return-to-clinic precautions discussed, patient verbalized understanding.    Final Clinical Impressions(s) / UC Diagnoses   Final diagnoses:  Dental infection  Pain, dental  Infected dental caries  Immunosuppression [facility])     Discharge Instructions      Your dental pain is likely due to dental infection. Take  antibiotic as prescribed for the next 7 days to treat your dental infection. Tylenol  1,000mg  every 6 hours as needed for pain. Use a small  amount of lidocaine  over the affected teeth every 3-4 hours as needed for pain. This may help to numb the area.  You may also use tylenol  as needed for pain. Perform salt water gargles every 3-4 hours.  Schedule an appointment with one of the dentists on the list provided to urgent care today.  If you develop any new or worsening symptoms or if your symptoms do not start to improve, pleases return here or follow-up with your primary care provider. If your symptoms are severe, please go to the emergency room.   ED Prescriptions     Medication Sig Dispense Auth. Provider   lidocaine  (XYLOCAINE ) 2 % solution Use as directed 5 mLs in the mouth or throat every 4 (four) hours as needed for mouth pain. 100 mL Enedelia Dorna HERO, FNP   amoxicillin -clavulanate (AUGMENTIN ) 875-125 MG tablet Take 1 tablet by mouth every 12 (twelve) hours. 14 tablet Enedelia Dorna HERO, FNP      PDMP not reviewed this encounter.   Enedelia Dorna HERO, [place] 03/29/24 1545

## 2024-03-29 NOTE — ED Triage Notes (Signed)
 C/o lower right tooth pain 7/10. Ongoing more than a week ago. Taken tylenol  with some relief. Worse in the mornings.

## 2024-03-29 NOTE — Discharge Instructions (Addendum)
 Your dental pain is likely due to dental infection. Take  antibiotic as prescribed for the next 7 days to treat your dental infection. Tylenol  1,000mg  every 6 hours as needed for pain. Use a small amount of lidocaine  over the affected teeth every 3-4 hours as needed for pain. This may help to numb the area.  You may also use tylenol  as needed for pain. Perform salt water gargles every 3-4 hours.  Schedule an appointment with one of the dentists on the list provided to urgent care today.  If you develop any new or worsening symptoms or if your symptoms do not start to improve, pleases return here or follow-up with your primary care provider. If your symptoms are severe, please go to the emergency room.

## 2024-03-31 ENCOUNTER — Other Ambulatory Visit: Payer: Self-pay

## 2024-03-31 NOTE — Progress Notes (Signed)
 Specialty Pharmacy Refill Coordination Note  Aaron Lee is a 33 y.o. male contacted today regarding refills of specialty medication(s) Darunavir -Cobicistat  (Prezcobix ); Dolutegravir  Sodium (Tivicay )   Patient requested Delivery   Delivery date: 04/04/24   Verified address: [address]   [place] [address]   Medication will be filled on 04/03/24.

## 2024-04-05 ENCOUNTER — Telehealth: Payer: Self-pay

## 2024-04-05 DIAGNOSIS — K0510 Chronic gingivitis, plaque induced: Secondary | ICD-10-CM

## 2024-04-05 NOTE — Telephone Encounter (Signed)
 Received call from Jasson' father, he states Marquiz has a bad toothache and was wondering if Corean could do anything.   He saw UC on 7/9 and was provided with 7 days of Augmentin , Kaiyu has finished the course, but is still having pain.   His mother has been calling around trying to get him in with a dentist that accepts Medicaid, but they are booked out.   Charday Capetillo, BSN, RN

## 2024-04-06 ENCOUNTER — Other Ambulatory Visit (HOSPITAL_COMMUNITY): Payer: Self-pay

## 2024-04-06 ENCOUNTER — Other Ambulatory Visit: Payer: Self-pay

## 2024-04-06 MED ORDER — CHLORHEXIDINE GLUCONATE 0.12 % MT SOLN
15.0000 mL | Freq: Two times a day (BID) | OROMUCOSAL | 0 refills | Status: AC
  Filled 2024-04-06: qty 473, 16d supply, fill #0

## 2024-04-06 NOTE — Addendum Note (Signed)
 Addended by: FLORENE BOUCHARD D on: 04/06/2024 08:32 AM   Modules accepted: Orders

## 2024-04-06 NOTE — Telephone Encounter (Signed)
 Spoke with Jerona' Dad, Lynwood, relayed per [place] that if pain was caused by infection that the antibiotic should have helped. Discussed that pain could be caused by gingivitis and/or tooth decay that would need to be addressed by dental team.   Relayed that we would send in Peridex  mouthwash to [facility] to use twice daily. Discussed that Corean recommends Tylenol  1,000 mg three times a day, supplemented with ibuprofen  as needed due to increase in creatinine.   Discussed that if Justine' pain does not improve or worsens over the weekend that he should go back to [facility] for further evaluation.   Lynwood verbalized understanding and has no further questions.   Corynne Scibilia, BSN, RN

## 2024-04-26 ENCOUNTER — Other Ambulatory Visit: Payer: Self-pay

## 2024-04-26 NOTE — Progress Notes (Signed)
 Specialty Pharmacy Refill Coordination Note  Aaron Lee is a 33 y.o. male contacted today regarding refills of specialty medication(s) Darunavir -Cobicistat  (Prezcobix ); Dolutegravir  Sodium (Tivicay )   Patient requested Delivery   Delivery date: 05/01/24   Verified address: [address]   [place] [address]   Medication will be filled on 04/28/24.

## 2024-04-27 ENCOUNTER — Other Ambulatory Visit: Payer: Self-pay

## 2024-05-19 ENCOUNTER — Other Ambulatory Visit: Payer: Self-pay

## 2024-05-19 NOTE — Progress Notes (Signed)
 Specialty Pharmacy Refill Coordination Note  Aaron Lee is a 33 y.o. male contacted today regarding refills of specialty medication(s) Darunavir -Cobicistat  (Prezcobix ); Dolutegravir  Sodium (Tivicay )   Patient requested Delivery   Delivery date: 05/24/24   Verified address: [address]   [place] [address]   Medication will be filled on 05/23/24.

## 2024-05-30 ENCOUNTER — Other Ambulatory Visit: Payer: Self-pay

## 2024-05-31 ENCOUNTER — Other Ambulatory Visit: Payer: Self-pay

## 2024-05-31 NOTE — Progress Notes (Signed)
 Specialty Pharmacy Ongoing Clinical Assessment Note  Aaron Lee is a 33 y.o. male who is being followed by the specialty pharmacy service for RxSp HIV   Patient's specialty medication(s) reviewed today: Darunavir -Cobicistat  (Prezcobix ); Dolutegravir  Sodium (Tivicay )   Missed doses in the last 4 weeks: 0   Patient/Caregiver did not have any additional questions or concerns.   Therapeutic benefit summary: Patient is achieving benefit   Adverse events/side effects summary: No adverse events/side effects   Patient's therapy is appropriate to: Continue    Goals Addressed             This Visit's Progress    Achieve Undetectable HIV Viral Load < 20   On track    Patient is on track. Patient will work on increased adherence and adhere to provider and/or lab appointments. VL 03/21/24 < 20.          Follow up: 3 months  [name] Specialty Pharmacist

## 2024-06-01 ENCOUNTER — Other Ambulatory Visit (HOSPITAL_COMMUNITY): Payer: Self-pay

## 2024-06-21 ENCOUNTER — Other Ambulatory Visit: Payer: Self-pay

## 2024-06-21 ENCOUNTER — Other Ambulatory Visit (HOSPITAL_COMMUNITY): Payer: Self-pay

## 2024-06-22 ENCOUNTER — Other Ambulatory Visit: Payer: Self-pay

## 2024-06-22 ENCOUNTER — Other Ambulatory Visit (HOSPITAL_COMMUNITY): Payer: Self-pay

## 2024-06-22 NOTE — Progress Notes (Signed)
 Specialty Pharmacy Refill Coordination Note  Aaron Lee is a 33 y.o. male assessed today regarding refills of clinic administered specialty medication(s) Darunavir -Cobicistat  (Prezcobix ); Dolutegravir  Sodium (Tivicay )   Clinic requested Delivery   Delivery date: 06/23/24   Verified address: Patient address [address]  [place] [address]   Medication will be filled on 06/22/24.

## 2024-06-23 ENCOUNTER — Other Ambulatory Visit: Payer: Self-pay

## 2024-07-03 ENCOUNTER — Ambulatory Visit: Admitting: Infectious Diseases

## 2024-07-04 ENCOUNTER — Ambulatory Visit: Admitting: Infectious Diseases

## 2024-07-13 ENCOUNTER — Ambulatory Visit: Admitting: Infectious Diseases

## 2024-07-13 ENCOUNTER — Other Ambulatory Visit (HOSPITAL_COMMUNITY): Payer: Self-pay

## 2024-07-13 ENCOUNTER — Encounter: Payer: Self-pay | Admitting: Infectious Diseases

## 2024-07-13 ENCOUNTER — Other Ambulatory Visit: Payer: Self-pay

## 2024-07-13 VITALS — BP 122/81 | HR 82 | Temp 97.9°F | Ht 63.0 in | Wt 106.0 lb

## 2024-07-13 DIAGNOSIS — B20 Human immunodeficiency virus [HIV] disease: Secondary | ICD-10-CM

## 2024-07-13 DIAGNOSIS — K0510 Chronic gingivitis, plaque induced: Secondary | ICD-10-CM

## 2024-07-13 DIAGNOSIS — Z79899 Other long term (current) drug therapy: Secondary | ICD-10-CM

## 2024-07-13 MED ORDER — ZOSTER VAC RECOMB ADJUVANTED 50 MCG/0.5ML IM SUSR: 0.5000 mL | Freq: Once | INTRAMUSCULAR | 0 refills | Status: AC

## 2024-07-13 MED ORDER — PREZCOBIX 800-150 MG PO TABS
1.0000 | ORAL_TABLET | Freq: Every day | ORAL | 11 refills | Status: AC
  Filled 2024-07-13 – 2024-07-14 (×2): qty 30, 30d supply, fill #0
  Filled 2024-08-11: qty 30, 30d supply, fill #1
  Filled 2024-09-13: qty 30, 30d supply, fill #2
  Filled 2024-10-12: qty 30, 30d supply, fill #3

## 2024-07-13 MED ORDER — LIDOCAINE VISCOUS HCL 2 % MT SOLN: 5.0000 mL | OROMUCOSAL | 2 refills | Status: AC | PRN

## 2024-07-13 MED ORDER — SULFAMETHOXAZOLE-TRIMETHOPRIM 400-80 MG PO TABS
1.0000 | ORAL_TABLET | Freq: Every day | ORAL | 11 refills | Status: AC
  Filled 2024-07-13: qty 30, 30d supply, fill #0

## 2024-07-13 MED ORDER — TIVICAY 50 MG PO TABS
50.0000 mg | ORAL_TABLET | Freq: Every day | ORAL | 11 refills | Status: AC
  Filled 2024-07-13 – 2024-07-14 (×2): qty 30, 30d supply, fill #0
  Filled 2024-08-11: qty 30, 30d supply, fill #1
  Filled 2024-09-13: qty 30, 30d supply, fill #2
  Filled 2024-10-12: qty 30, 30d supply, fill #3

## 2024-07-13 NOTE — Progress Notes (Signed)
 u      Name: Aaron Lee  DOB: 1991-08-22  MRN: [id number]  PCP: Patient, No Pcp Per     SUBJECTIVE: Aaron Lee is a 33 y.o. male with HIV and AIDS in November 2018 with CD4 nadir < 10, VL 192,000 copies.  HIV Risk: MSM/sexual   OI Hx: shingles  Previous Regimens:  Biktarvy  --> suppressed (changed to PI for adherence concern) Symtuza  2020 Cabenuva  Dec 2022 --> failed with Y181C  Prezcobix  + Tivicay  2022  Genotype:  07/2017 - K103S (R-nevirapine) 05-2019 - K103S, no integrase mutations  10/2019 - K103S, no integrase mutations.  10/2021 - K70KT, K103S, V106VAIT, Y181C, R211K (see phone note for cumulative genotype)    Chief Complaint  Patient presents with   Follow-up    B20    Discussed the use of AI scribe software for clinical note transcription with the patient, who gave verbal consent to proceed.  History of Present Illness   Aaron Lee is a 33 year old male with HIV who presents for routine follow-up care.  He continues to take Bactrim , Tivicay , and Prezcobix  once daily. His viral load, which was 1550 copies in April, improved to undetectable levels by July. His most recent CD4 count was approximately 120. No new worries or concerns, and no recent illnesses.  Since the last office visit, he has experienced dental infections that required urgent care visits. He reports improvement in his dental health and does not currently wish to refill the mouth rinse provided over the summer, but he requests a refill of lidocaine  for oral use. He has not visited a dentist recently but is willing to see the dentist available at the clinic.  He is currently living with his father and maintains regular contact with his sister.        Review of Systems  All other systems reviewed and are negative.    Past Medical History:  Diagnosis Date   Eczema    HIV (human immunodeficiency virus infection) (HCC)    Shingles outbreak 09/26/2018    Social History    Tobacco Use   Smoking status: Light Smoker    Current packs/day: 0.10    Types: Cigarettes   Smokeless tobacco: Never  Substance Use Topics   Alcohol use: Yes    Comment: occ   Drug use: Yes    Frequency: 7.0 times per week    Types: Marijuana    No Known Allergies   Outpatient Medications Prior to Visit  Medication Sig Dispense Refill   chlorhexidine  (PERIDEX ) 0.12 % solution Use as directed 15 mLs in the mouth or throat 2 (two) times daily. 473 mL 0   darunavir -cobicistat  (PREZCOBIX ) 800-150 MG tablet Take 1 tablet by mouth daily. 30 tablet 11   dolutegravir  (TIVICAY ) 50 MG tablet Take 1 tablet (50 mg total) by mouth daily. 30 tablet 11   lidocaine  (XYLOCAINE ) 2 % solution Use as directed 5 mLs in the mouth or throat every 4 (four) hours as needed for mouth pain. 100 mL 0   sulfamethoxazole -trimethoprim  (BACTRIM ) 400-80 MG tablet Take 1 tablet by mouth daily. 30 tablet 11   amoxicillin -clavulanate (AUGMENTIN ) 875-125 MG tablet Take 1 tablet by mouth every 12 (twelve) hours. (Patient not taking: Reported on 07/13/2024) 14 tablet 0   No facility-administered medications prior to visit.           Objective: Vitals:   07/13/24 1607  BP: 122/81  Pulse: 82  Temp: 97.9 F (36.6 C)  SpO2:  98%   Wt Readings from Last 3 Encounters:  07/13/24 106 lb (48.1 kg)  03/21/24 107 lb (48.5 kg)  12/21/23 110 lb (49.9 kg)    Physical Exam Vitals reviewed.  Constitutional:      Appearance: Normal appearance. He is not ill-appearing.  HENT:     Head: Normocephalic.     Mouth/Throat:     Mouth: Mucous membranes are moist.     Pharynx: Oropharynx is clear.  Eyes:     General: No scleral icterus. Cardiovascular:     Rate and Rhythm: Normal rate and regular rhythm.  Pulmonary:     Effort: Pulmonary effort is normal.  Musculoskeletal:        General: Normal range of motion.     Cervical back: Normal range of motion.  Skin:    Coloration: Skin is not jaundiced or pale.   Neurological:     Mental Status: He is alert and oriented to person, place, and time.  Psychiatric:        Mood and Affect: Mood normal.        Judgment: Judgment normal.     Lab Results Lab Results  Component Value Date   WBC 2.5 (L) 03/21/2024   HGB 13.4 03/21/2024   HCT 41.3 03/21/2024   MCV 88.2 03/21/2024   PLT 67 (L) 03/21/2024    Lab Results  Component Value Date   CREATININE 1.58 (H) 03/21/2024   BUN 9 03/21/2024   NA 141 03/21/2024   K 3.6 03/21/2024   CL 107 03/21/2024   CO2 27 03/21/2024    Lab Results  Component Value Date   ALT 26 03/21/2024   AST 26 03/21/2024   ALKPHOS 200 (H) 12/20/2022   BILITOT 0.5 03/21/2024    HIV 1 RNA Quant  Date Value  03/21/2024 <20 DETECTED copies/mL (A)  12/21/2023 1,550 copies/mL (H)  09/06/2023 136 Copies/mL (H)   CD4 T Cell Abs (/uL)  Date Value  03/21/2024 118 (L)  12/21/2023 119 (L)  09/06/2023 123 (L)    Assessment and Plan:    HIV infection - 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 - Continue Bactrim , Tivicay , and Prezcobix  once daily. - Send in refills for all current medications. - Recheck liver and kidney function tests for routine care  - Statin therapy and screening at 33 yo   Dental infections - Recent dental infections required urgent care visits. - Refer to in-house dental services for evaluation and treatment. - Provide contact information for dental services and facilitate appointment scheduling. - Refill prescription for lidocaine  for oral use.  Vaccinations - Due for the second dose of the shingles vaccine and opted to delay the flu shot for two weeks. - Administer second dose of shingles vaccine today. - Advise to receive flu shot in two weeks, either at the clinic or any pharmacy.      Orders Placed This Encounter  Procedures   HIV 1 RNA quant-no reflex-bld   COMPLETE METABOLIC PANEL  WITHOUT GFR   CBC w/Diff   RPR   T-helper cells (CD4) count   Meds ordered this encounter  Medications   lidocaine  (XYLOCAINE ) 2 % solution    Sig: Use as directed 5 mLs in the mouth or throat every 4 (four) hours as needed for mouth pain.    Dispense:  100 mL    Refill:  2   sulfamethoxazole -trimethoprim  (BACTRIM ) 400-80  MG tablet    Sig: Take 1 tablet by mouth daily.    Dispense:  30 tablet    Refill:  11   dolutegravir  (TIVICAY ) 50 MG tablet    Sig: Take 1 tablet (50 mg total) by mouth daily.    Dispense:  30 tablet    Refill:  11    Please mail all medications - thank you!    Prescription Type::   Renewal   darunavir -cobicistat  (PREZCOBIX ) 800-150 MG tablet    Sig: Take 1 tablet by mouth daily.    Dispense:  30 tablet    Refill:  11    Please mail all medications - thank you!    Prescription Type::   Renewal   Zoster Vaccine Adjuvanted (SHINGRIX ) injection    Sig: Inject 0.5 mLs into the muscle once for 1 dose.    Dispense:  0.5 mL    Refill:  0    Return in about 4 months (around 11/13/2024).   Corean Fireman, MSN, NP-C [facility]  [email or web address] Pager: [phone] Office: [phone] [facility] Main Line: [phone]  07/13/2024

## 2024-07-13 NOTE — Patient Instructions (Addendum)
 Refills for the lidocaine  sent in for your   Refills for your Prezcobix , Tivcay and Bactrim  have been sent in to mail order pharmacy   Please call Aaron Lee with the dental scheduling team at [phone] to schedule a dental appointment here at our clinic

## 2024-07-14 ENCOUNTER — Other Ambulatory Visit: Payer: Self-pay

## 2024-07-14 ENCOUNTER — Other Ambulatory Visit (HOSPITAL_COMMUNITY): Payer: Self-pay

## 2024-07-14 LAB — T-HELPER CELLS (CD4) COUNT (NOT AT ARMC)
CD4 % Helper T Cell: 21 % — ABNORMAL LOW (ref 33–65)
CD4 T Cell Abs: 124 /uL — ABNORMAL LOW (ref 400–1790)

## 2024-07-14 NOTE — Progress Notes (Signed)
 Specialty Pharmacy Refill Coordination Note  Aaron Lee is a 33 y.o. male contacted today regarding refills of specialty medication(s) Darunavir -Cobicistat  (Prezcobix ); Dolutegravir  Sodium (Tivicay )   Patient requested Delivery   Delivery date: 07/21/24   Verified address: Patient address [address]  [place] [address]   Medication will be filled on 07/20/24.

## 2024-07-15 LAB — CBC WITH DIFFERENTIAL/PLATELET
Absolute Lymphocytes: 691 {cells}/uL — ABNORMAL LOW (ref 850–3900)
Absolute Monocytes: 320 {cells}/uL (ref 200–950)
Basophils Absolute: 50 {cells}/uL (ref 0–200)
Basophils Relative: 1.4 %
Eosinophils Absolute: 220 {cells}/uL (ref 15–500)
Eosinophils Relative: 6.1 %
HCT: 44.1 % (ref 38.5–50.0)
Hemoglobin: 14.1 g/dL (ref 13.2–17.1)
MCH: 29.4 pg (ref 27.0–33.0)
MCHC: 32 g/dL (ref 32.0–36.0)
MCV: 91.9 fL (ref 80.0–100.0)
MPV: 13 fL — ABNORMAL HIGH (ref 7.5–12.5)
Monocytes Relative: 8.9 %
Neutro Abs: 2318 {cells}/uL (ref 1500–7800)
Neutrophils Relative %: 64.4 %
Platelets: 122 Thousand/uL — ABNORMAL LOW (ref 140–400)
RBC: 4.8 Million/uL (ref 4.20–5.80)
RDW: 13.2 % (ref 11.0–15.0)
Total Lymphocyte: 19.2 %
WBC: 3.6 Thousand/uL — ABNORMAL LOW (ref 3.8–10.8)

## 2024-07-15 LAB — COMPLETE METABOLIC PANEL WITHOUT GFR
AG Ratio: 1.2 (calc) (ref 1.0–2.5)
ALT: 32 U/L (ref 9–46)
AST: 30 U/L (ref 10–40)
Albumin: 4.3 g/dL (ref 3.6–5.1)
Alkaline phosphatase (APISO): 58 U/L (ref 36–130)
BUN: 12 mg/dL (ref 7–25)
CO2: 30 mmol/L (ref 20–32)
Calcium: 8.9 mg/dL (ref 8.6–10.3)
Chloride: 104 mmol/L (ref 98–110)
Creat: 1.24 mg/dL (ref 0.60–1.26)
Globulin: 3.5 g/dL (ref 1.9–3.7)
Glucose, Bld: 72 mg/dL (ref 65–99)
Potassium: 3.8 mmol/L (ref 3.5–5.3)
Sodium: 140 mmol/L (ref 135–146)
Total Bilirubin: 0.5 mg/dL (ref 0.2–1.2)
Total Protein: 7.8 g/dL (ref 6.1–8.1)

## 2024-07-15 LAB — HIV-1 RNA QUANT-NO REFLEX-BLD
HIV 1 RNA Quant: NOT DETECTED {copies}/mL
HIV-1 RNA Quant, Log: NOT DETECTED {Log_copies}/mL

## 2024-07-15 LAB — T PALLIDUM AB: T Pallidum Abs: POSITIVE — AB

## 2024-07-15 LAB — RPR TITER: RPR Titer: 1:2 {titer} — ABNORMAL HIGH

## 2024-07-15 LAB — RPR: RPR Ser Ql: REACTIVE — AB

## 2024-07-17 ENCOUNTER — Ambulatory Visit: Payer: Self-pay | Admitting: Infectious Diseases

## 2024-07-20 ENCOUNTER — Other Ambulatory Visit: Payer: Self-pay

## 2024-08-11 ENCOUNTER — Other Ambulatory Visit: Payer: Self-pay

## 2024-08-11 NOTE — Progress Notes (Signed)
 Specialty Pharmacy Ongoing Clinical Assessment Note  Aaron Lee is a 33 y.o. male who is being followed by the specialty pharmacy service for RxSp HIV   Patient's specialty medication(s) reviewed today: Dolutegravir  Sodium (Tivicay ); Darunavir -Cobicistat  (Prezcobix )   Missed doses in the last 4 weeks: 0   Patient/Caregiver did not have any additional questions or concerns.   Therapeutic benefit summary: Patient is achieving benefit   Adverse events/side effects summary: No adverse events/side effects   Patient's therapy is appropriate to: Continue    Goals Addressed             This Visit's Progress    Achieve Undetectable HIV Viral Load < 20   On track    Patient is on track. Patient will maintain adherence and adhere to provider and/or lab appointments. Viral load was undetectable as of 07/13/24         Follow up: 3 months  [facility] Specialty Pharmacist

## 2024-08-11 NOTE — Progress Notes (Signed)
 Specialty Pharmacy Refill Coordination Note  Aaron Lee is a 33 y.o. male contacted today regarding refills of specialty medication(s) Darunavir -Cobicistat  (Prezcobix ); Dolutegravir  Sodium (Tivicay )   Patient requested Delivery   Delivery date: 08/18/24   Verified address: Patient address [address]  [place] [address]   Medication will be filled on: 08/16/24

## 2024-08-16 ENCOUNTER — Other Ambulatory Visit: Payer: Self-pay

## 2024-09-08 ENCOUNTER — Other Ambulatory Visit (HOSPITAL_COMMUNITY): Payer: Self-pay

## 2024-09-13 ENCOUNTER — Other Ambulatory Visit (HOSPITAL_COMMUNITY): Payer: Self-pay

## 2024-09-15 ENCOUNTER — Other Ambulatory Visit: Payer: Self-pay

## 2024-09-19 ENCOUNTER — Other Ambulatory Visit: Payer: Self-pay

## 2024-09-19 NOTE — Progress Notes (Signed)
 Specialty Pharmacy Refill Coordination Note  Aaron Lee is a 33 y.o. male contacted today regarding refills of specialty medication(s) Darunavir -Cobicistat  (Prezcobix ); Dolutegravir  Sodium (Tivicay )   Patient requested Delivery   Delivery date: 09/20/24   Verified address: [address]   [place] [address]   Medication will be filled on: 09/19/24

## 2024-10-12 ENCOUNTER — Other Ambulatory Visit: Payer: Self-pay

## 2024-10-12 ENCOUNTER — Other Ambulatory Visit: Payer: Self-pay | Admitting: Pharmacy Technician

## 2024-10-12 NOTE — Progress Notes (Signed)
 Specialty Pharmacy Refill Coordination Note  Aaron Lee is a 34 y.o. male contacted today regarding refills of specialty medication(s) Darunavir -Cobicistat  (Prezcobix ); Dolutegravir  Sodium (Tivicay )   Patient requested Delivery   Delivery date: 10/19/24   Verified address: [address]   [place]  [address]   Medication will be filled on: 10/18/24

## 2024-10-18 ENCOUNTER — Other Ambulatory Visit: Payer: Self-pay

## 2024-10-27 ENCOUNTER — Other Ambulatory Visit: Payer: Self-pay

## 2024-10-27 NOTE — Progress Notes (Signed)
 Specialty Pharmacy Ongoing Clinical Assessment Note  Aaron Lee is a 34 y.o. male who is being followed by the specialty pharmacy service for RxSp HIV   Patient's specialty medication(s) reviewed today: Darunavir -Cobicistat  (Prezcobix ); Dolutegravir  Sodium (Tivicay )   Missed doses in the last 4 weeks: 0   Patient/Caregiver did not have any additional questions or concerns.   Therapeutic benefit summary: Patient is achieving benefit   Adverse events/side effects summary: No adverse events/side effects   Patient's therapy is appropriate to: Continue    Goals Addressed             This Visit's Progress    Achieve Undetectable HIV Viral Load < 20   On track    Patient is on track. Patient will maintain adherence and adhere to provider and/or lab appointments. Viral load was undetectable as of 07/13/24         Follow up: 3 months  [facility] Specialty Pharmacist

## 2024-11-01 ENCOUNTER — Ambulatory Visit: Admitting: Infectious Diseases
